# Patient Record
Sex: Male | Born: 1940 | Race: White | Hispanic: No | Smoking: Former smoker
Health system: Southern US, Community
[De-identification: ages and names within clinical notes are randomized; demographics above are authoritative.]

## PROBLEM LIST (undated history)

## (undated) DIAGNOSIS — Z87442 Personal history of urinary calculi: Secondary | ICD-10-CM

## (undated) DIAGNOSIS — Z8619 Personal history of other infectious and parasitic diseases: Secondary | ICD-10-CM

## (undated) DIAGNOSIS — Z860100 Personal history of colon polyps, unspecified: Secondary | ICD-10-CM

## (undated) DIAGNOSIS — M1990 Unspecified osteoarthritis, unspecified site: Secondary | ICD-10-CM

## (undated) DIAGNOSIS — G8929 Other chronic pain: Secondary | ICD-10-CM

## (undated) DIAGNOSIS — H409 Unspecified glaucoma: Secondary | ICD-10-CM

## (undated) DIAGNOSIS — Z9889 Other specified postprocedural states: Secondary | ICD-10-CM

## (undated) DIAGNOSIS — E119 Type 2 diabetes mellitus without complications: Secondary | ICD-10-CM

## (undated) DIAGNOSIS — R399 Unspecified symptoms and signs involving the genitourinary system: Secondary | ICD-10-CM

## (undated) DIAGNOSIS — Z8719 Personal history of other diseases of the digestive system: Secondary | ICD-10-CM

## (undated) DIAGNOSIS — Z9689 Presence of other specified functional implants: Secondary | ICD-10-CM

## (undated) DIAGNOSIS — G4733 Obstructive sleep apnea (adult) (pediatric): Secondary | ICD-10-CM

## (undated) DIAGNOSIS — M549 Dorsalgia, unspecified: Secondary | ICD-10-CM

## (undated) DIAGNOSIS — D414 Neoplasm of uncertain behavior of bladder: Secondary | ICD-10-CM

## (undated) DIAGNOSIS — N529 Male erectile dysfunction, unspecified: Secondary | ICD-10-CM

## (undated) DIAGNOSIS — N400 Enlarged prostate without lower urinary tract symptoms: Secondary | ICD-10-CM

## (undated) DIAGNOSIS — K5790 Diverticulosis of intestine, part unspecified, without perforation or abscess without bleeding: Secondary | ICD-10-CM

## (undated) DIAGNOSIS — Z87828 Personal history of other (healed) physical injury and trauma: Secondary | ICD-10-CM

## (undated) DIAGNOSIS — K219 Gastro-esophageal reflux disease without esophagitis: Secondary | ICD-10-CM

## (undated) DIAGNOSIS — N486 Induration penis plastica: Secondary | ICD-10-CM

## (undated) DIAGNOSIS — R202 Paresthesia of skin: Secondary | ICD-10-CM

## (undated) DIAGNOSIS — E785 Hyperlipidemia, unspecified: Secondary | ICD-10-CM

## (undated) DIAGNOSIS — Z9989 Dependence on other enabling machines and devices: Secondary | ICD-10-CM

## (undated) HISTORY — PX: ROTATOR CUFF REPAIR: SHX139

## (undated) HISTORY — PX: OTHER SURGICAL HISTORY: SHX169

## (undated) HISTORY — PX: NASAL SEPTUM SURGERY: SHX37

## (undated) HISTORY — PX: RETINAL DETACHMENT SURGERY: SHX105

## (undated) HISTORY — PX: INGUINAL HERNIA REPAIR: SUR1180

## (undated) HISTORY — PX: COLONOSCOPY: SHX174

## (undated) HISTORY — PX: SHOULDER OPEN ROTATOR CUFF REPAIR: SHX2407

## (undated) HISTORY — PX: APPENDECTOMY: SHX54

## (undated) HISTORY — PX: CATARACT EXTRACTION W/ INTRAOCULAR LENS  IMPLANT, BILATERAL: SHX1307

## (undated) HISTORY — DX: Hyperlipidemia, unspecified: E78.5

## (undated) HISTORY — PX: TONSILLECTOMY: SUR1361

---

## 1998-05-24 ENCOUNTER — Ambulatory Visit (HOSPITAL_COMMUNITY): Admission: RE | Admit: 1998-05-24 | Discharge: 1998-05-24 | Payer: Self-pay | Admitting: Specialist

## 1998-07-18 ENCOUNTER — Ambulatory Visit (HOSPITAL_COMMUNITY): Admission: RE | Admit: 1998-07-18 | Discharge: 1998-07-18 | Payer: Self-pay | Admitting: Specialist

## 1999-08-30 ENCOUNTER — Ambulatory Visit (HOSPITAL_COMMUNITY): Admission: RE | Admit: 1999-08-30 | Discharge: 1999-08-30 | Payer: Self-pay | Admitting: Specialist

## 2001-04-15 ENCOUNTER — Encounter: Payer: Self-pay | Admitting: Family Medicine

## 2001-04-15 ENCOUNTER — Encounter: Admission: RE | Admit: 2001-04-15 | Discharge: 2001-04-15 | Payer: Self-pay | Admitting: Family Medicine

## 2003-04-23 ENCOUNTER — Ambulatory Visit (HOSPITAL_COMMUNITY): Admission: RE | Admit: 2003-04-23 | Discharge: 2003-04-23 | Payer: Self-pay | Admitting: Gastroenterology

## 2004-06-16 ENCOUNTER — Inpatient Hospital Stay (HOSPITAL_COMMUNITY): Admission: RE | Admit: 2004-06-16 | Discharge: 2004-06-18 | Payer: Self-pay | Admitting: Orthopaedic Surgery

## 2004-06-16 HISTORY — PX: ANTERIOR CERVICAL DECOMP/DISCECTOMY FUSION: SHX1161

## 2005-01-22 ENCOUNTER — Ambulatory Visit (HOSPITAL_COMMUNITY): Admission: RE | Admit: 2005-01-22 | Discharge: 2005-01-22 | Payer: Self-pay | Admitting: Neurosurgery

## 2005-03-08 ENCOUNTER — Inpatient Hospital Stay (HOSPITAL_COMMUNITY): Admission: RE | Admit: 2005-03-08 | Discharge: 2005-03-10 | Payer: Self-pay | Admitting: Neurosurgery

## 2005-03-08 HISTORY — PX: POSTERIOR LAMINECTOMY / DECOMPRESSION CERVICAL SPINE: SUR739

## 2005-03-13 ENCOUNTER — Emergency Department (HOSPITAL_COMMUNITY): Admission: EM | Admit: 2005-03-13 | Discharge: 2005-03-13 | Payer: Self-pay | Admitting: Emergency Medicine

## 2005-07-12 ENCOUNTER — Encounter: Admission: RE | Admit: 2005-07-12 | Discharge: 2005-10-10 | Payer: Self-pay | Admitting: Neurosurgery

## 2005-12-20 ENCOUNTER — Ambulatory Visit: Payer: Self-pay | Admitting: Pulmonary Disease

## 2006-01-11 ENCOUNTER — Encounter: Payer: Self-pay | Admitting: Pulmonary Disease

## 2006-01-11 ENCOUNTER — Ambulatory Visit (HOSPITAL_BASED_OUTPATIENT_CLINIC_OR_DEPARTMENT_OTHER): Admission: RE | Admit: 2006-01-11 | Discharge: 2006-01-11 | Payer: Self-pay | Admitting: Pulmonary Disease

## 2006-02-05 ENCOUNTER — Ambulatory Visit: Payer: Self-pay | Admitting: Pulmonary Disease

## 2006-02-06 ENCOUNTER — Ambulatory Visit: Payer: Self-pay | Admitting: Pulmonary Disease

## 2006-02-12 ENCOUNTER — Ambulatory Visit (HOSPITAL_COMMUNITY): Admission: RE | Admit: 2006-02-12 | Discharge: 2006-02-12 | Payer: Self-pay | Admitting: Neurosurgery

## 2006-03-25 ENCOUNTER — Ambulatory Visit: Payer: Self-pay | Admitting: Pulmonary Disease

## 2007-11-26 ENCOUNTER — Ambulatory Visit (HOSPITAL_BASED_OUTPATIENT_CLINIC_OR_DEPARTMENT_OTHER): Admission: RE | Admit: 2007-11-26 | Discharge: 2007-11-27 | Payer: Self-pay | Admitting: Urology

## 2007-11-26 HISTORY — PX: PENILE PROSTHESIS IMPLANT: SHX240

## 2008-03-11 ENCOUNTER — Encounter: Admission: RE | Admit: 2008-03-11 | Discharge: 2008-03-11 | Payer: Self-pay | Admitting: Neurosurgery

## 2008-03-17 ENCOUNTER — Encounter: Admission: RE | Admit: 2008-03-17 | Discharge: 2008-03-17 | Payer: Self-pay | Admitting: Neurosurgery

## 2008-04-13 ENCOUNTER — Encounter: Admission: RE | Admit: 2008-04-13 | Discharge: 2008-04-13 | Payer: Self-pay | Admitting: Specialist

## 2008-06-09 DIAGNOSIS — J302 Other seasonal allergic rhinitis: Secondary | ICD-10-CM | POA: Insufficient documentation

## 2008-06-09 DIAGNOSIS — E785 Hyperlipidemia, unspecified: Secondary | ICD-10-CM | POA: Insufficient documentation

## 2008-06-09 DIAGNOSIS — J3089 Other allergic rhinitis: Secondary | ICD-10-CM

## 2008-06-10 ENCOUNTER — Ambulatory Visit: Payer: Self-pay | Admitting: Pulmonary Disease

## 2008-08-13 ENCOUNTER — Encounter: Payer: Self-pay | Admitting: Pulmonary Disease

## 2008-08-20 ENCOUNTER — Inpatient Hospital Stay (HOSPITAL_COMMUNITY): Admission: RE | Admit: 2008-08-20 | Discharge: 2008-08-22 | Payer: Self-pay | Admitting: Orthopedic Surgery

## 2008-08-20 HISTORY — PX: OTHER SURGICAL HISTORY: SHX169

## 2009-01-16 ENCOUNTER — Encounter: Admission: RE | Admit: 2009-01-16 | Discharge: 2009-01-16 | Payer: Self-pay | Admitting: Neurosurgery

## 2009-02-10 ENCOUNTER — Inpatient Hospital Stay (HOSPITAL_COMMUNITY): Admission: RE | Admit: 2009-02-10 | Discharge: 2009-02-14 | Payer: Self-pay | Admitting: Specialist

## 2009-02-10 HISTORY — PX: TOTAL KNEE ARTHROPLASTY: SHX125

## 2009-05-12 ENCOUNTER — Ambulatory Visit (HOSPITAL_COMMUNITY): Admission: RE | Admit: 2009-05-12 | Discharge: 2009-05-13 | Payer: Self-pay | Admitting: Neurosurgery

## 2009-05-12 HISTORY — PX: OTHER SURGICAL HISTORY: SHX169

## 2009-06-27 ENCOUNTER — Ambulatory Visit: Payer: Self-pay | Admitting: Pulmonary Disease

## 2009-06-27 DIAGNOSIS — G4733 Obstructive sleep apnea (adult) (pediatric): Secondary | ICD-10-CM | POA: Insufficient documentation

## 2009-07-22 ENCOUNTER — Inpatient Hospital Stay (HOSPITAL_COMMUNITY): Admission: RE | Admit: 2009-07-22 | Discharge: 2009-07-24 | Payer: Self-pay | Admitting: Orthopedic Surgery

## 2009-07-22 HISTORY — PX: OTHER SURGICAL HISTORY: SHX169

## 2010-07-17 ENCOUNTER — Ambulatory Visit: Payer: Self-pay | Admitting: Pulmonary Disease

## 2010-09-15 ENCOUNTER — Encounter: Payer: Self-pay | Admitting: Pulmonary Disease

## 2010-10-12 NOTE — Assessment & Plan Note (Signed)
Summary: rov for osa   Visit Type:  Follow-up  CC:  1 year follow up. pt states he wears cpap eveyrnight x 6-7 hrs a night. pt states he is not having any problems. .  History of Present Illness: the pt comes in today for f/u of his known osa.  He is wearing cpap compliantly, and denies any issues with cpap mask or pressure.  He feels that he is sleeping well with the device, and his wife has not seen breakthru snoring.  He does however, experience some afternoon sleepiness if he tries to read, and may take an afternoon nap.  Of note, his weight is actually down 2 pounds from last visit.  Preventive Screening-Counseling & Management  Alcohol-Tobacco     Smoking Status: quit  Current Medications (verified): 1)  Omeprazole 20 Mg Tbec (Omeprazole) .... Take 1 Tablet By Mouth Once A Day 2)  Aspirin Low Dose 81 Mg Tabs (Aspirin) .... Take 1 Tablet By Mouth Once A Day 3)  Requip 1 Mg Tabs (Ropinirole Hcl) .... Take 1 Tab By Mouth At Bedtime 4)  Gabapentin 300 Mg Caps (Gabapentin) .... Take 2 Tablets By Mouth Two Times A Day 5)  Fexofenadine Hcl 180 Mg Tabs (Fexofenadine Hcl) .... Take 1 Tablet By Mouth Once A Day 6)  Simvastatin 20 Mg Tabs (Simvastatin) .... Take 1/2 Tab By Mouth Once Daily 7)  Fish Oil 1200 Mg Caps (Omega-3 Fatty Acids) .... Take 1 Tablet By Mouth Once A Day 8)  Flunisolide .... Use Daily As Needed 9)  Percocet 10-325 Mg Tabs (Oxycodone-Acetaminophen) .... Take 1 Tablet By Mouth Once A Day 10)  Naproxen 500 Mg Tabs (Naproxen) .... One Tablet Two Times A Day  Allergies (verified): No Known Drug Allergies  Social History: Patient states former smoker. quit 10/1970. 1-1 1/2 ppd.   Review of Systems       The patient complains of nasal congestion/difficulty breathing through nose and hand/feet swelling.  The patient denies shortness of breath with activity, shortness of breath at rest, productive cough, non-productive cough, coughing up blood, chest pain, irregular  heartbeats, acid heartburn, indigestion, loss of appetite, weight change, abdominal pain, difficulty swallowing, sore throat, tooth/dental problems, headaches, sneezing, itching, ear ache, anxiety, depression, joint stiffness or pain, rash, change in color of mucus, and fever.    Vital Signs:  Patient profile:   70 year old male Height:      72 inches Weight:      267.50 pounds BMI:     36.41 O2 Sat:      94 % on Room air Temp:     98.3 degrees F oral Pulse rate:   73 / minute Cuff size:   regular  Vitals Entered By: Carver Fila (July 17, 2010 2:00 PM)  O2 Flow:  Room air CC: 1 year follow up. pt states he wears cpap eveyrnight x 6-7 hrs a night. pt states he is not having any problems.  Comments meds and allergies updated Phone number updated Mindy Edward Jolly  July 17, 2010 2:00 PM    Physical Exam  General:  obese male in nad Nose:  no skin breakdown or pressure necrosis from cpap mask Extremities:  no significant edema, no cyanosis  Neurologic:  alert, but does appear mildly sleepy. moves all 4.   Impression & Recommendations:  Problem # 1:  OBSTRUCTIVE SLEEP APNEA (ICD-327.23) the pt has known osa, and has been compliant with therapy.  He feels he is doing well,  but does recently note some afternoon sleepiness with periods of inactivity.  His weight is fairly neutral, and he denies any machine function issues or significant mask leaks.  I think it is worth rechecking his pressure on auto for the next 2 weeks to make sure everything is functioning properly and his pressure needs have not changed.  Medications Added to Medication List This Visit: 1)  Gabapentin 300 Mg Caps (Gabapentin) .... Take 2 tablets by mouth two times a day 2)  Flunisolide  .... Use daily as needed 3)  Naproxen 500 Mg Tabs (Naproxen) .... One tablet two times a day  Other Orders: Est. Patient Level III (16109) DME Referral (DME)  Patient Instructions: 1)  will recheck your pressure with auto  mode for 2 weeks, and will let you know your optimal pressure. 2)  work on weight loss 3)  followup with me in one year.   Immunization History:  Influenza Immunization History:    Influenza:  historical (05/11/2010)

## 2010-10-12 NOTE — Letter (Signed)
Summary: CMN for DME / [facility]  CMN for DME / [facility]   Imported By: Lennie Odor 09/26/2010 10:50:57  _____________________________________________________________________  External Attachment:    Type:   Image     Comment:   External Document

## 2010-12-13 LAB — URINALYSIS, ROUTINE W REFLEX MICROSCOPIC
Hgb urine dipstick: NEGATIVE
Ketones, ur: NEGATIVE mg/dL
Nitrite: NEGATIVE
Specific Gravity, Urine: 1.008 (ref 1.005–1.030)

## 2010-12-13 LAB — CBC
HCT: 30.6 % — ABNORMAL LOW (ref 39.0–52.0)
HCT: 31.6 % — ABNORMAL LOW (ref 39.0–52.0)
HCT: 40.8 % (ref 39.0–52.0)
MCHC: 33.5 g/dL (ref 30.0–36.0)
Platelets: 174 10*3/uL (ref 150–400)
Platelets: 182 10*3/uL (ref 150–400)
RDW: 15 % (ref 11.5–15.5)
RDW: 15.1 % (ref 11.5–15.5)
WBC: 6.6 10*3/uL (ref 4.0–10.5)
WBC: 9.9 10*3/uL (ref 4.0–10.5)

## 2010-12-13 LAB — BASIC METABOLIC PANEL
BUN: 8 mg/dL (ref 6–23)
BUN: 9 mg/dL (ref 6–23)
CO2: 25 mEq/L (ref 19–32)
Calcium: 8.1 mg/dL — ABNORMAL LOW (ref 8.4–10.5)
Calcium: 8.5 mg/dL (ref 8.4–10.5)
Chloride: 103 mEq/L (ref 96–112)
Chloride: 106 mEq/L (ref 96–112)
GFR calc Af Amer: >60 mL/min (ref >60)
GFR calc non Af Amer: >60 mL/min (ref >60)
GFR calc non Af Amer: >60 mL/min (ref >60)
Glucose, Bld: 109 mg/dL — ABNORMAL HIGH (ref 70–99)
Glucose, Bld: 156 mg/dL — ABNORMAL HIGH (ref 70–99)
Potassium: 4.1 mEq/L (ref 3.5–5.1)
Sodium: 137 mEq/L (ref 135–145)
Sodium: 138 mEq/L (ref 135–145)

## 2010-12-13 LAB — DIFFERENTIAL
Basophils Absolute: 0 10*3/uL (ref 0.0–0.1)
Eosinophils Relative: 2 % (ref 0–5)
Lymphocytes Relative: 23 % (ref 12–46)
Monocytes Absolute: 0.6 10*3/uL (ref 0.1–1.0)
Neutro Abs: 4.3 10*3/uL (ref 1.7–7.7)
Neutrophils Relative %: 66 % (ref 43–77)

## 2010-12-13 LAB — PROTIME-INR: Prothrombin Time: 12.8 seconds (ref 11.6–15.2)

## 2010-12-13 LAB — APTT: aPTT: 28 seconds (ref 24–37)

## 2010-12-13 LAB — TYPE AND SCREEN
ABO/RH(D): O POS
Antibody Screen: NEGATIVE

## 2010-12-13 LAB — POCT I-STAT 4, (NA,K, GLUC, HGB,HCT): Hemoglobin: 11.2 g/dL — ABNORMAL LOW (ref 13.0–17.0)

## 2010-12-16 LAB — CBC
HCT: 41 % (ref 39.0–52.0)
Hemoglobin: 13.8 g/dL (ref 13.0–17.0)
RBC: 4.88 MIL/uL (ref 4.22–5.81)
RDW: 15.6 % — ABNORMAL HIGH (ref 11.5–15.5)
WBC: 11.3 10*3/uL — ABNORMAL HIGH (ref 4.0–10.5)

## 2010-12-18 LAB — CBC
HCT: 32.3 % — ABNORMAL LOW (ref 39.0–52.0)
HCT: 37.8 % — ABNORMAL LOW (ref 39.0–52.0)
HCT: 38.3 % — ABNORMAL LOW (ref 39.0–52.0)
Hemoglobin: 10.7 g/dL — ABNORMAL LOW (ref 13.0–17.0)
Hemoglobin: 12.4 g/dL — ABNORMAL LOW (ref 13.0–17.0)
Hemoglobin: 12.7 g/dL — ABNORMAL LOW (ref 13.0–17.0)
MCHC: 33.1 g/dL (ref 30.0–36.0)
MCV: 87.3 fL (ref 78.0–100.0)
Platelets: 164 10*3/uL (ref 150–400)
RBC: 4.38 MIL/uL (ref 4.22–5.81)
RDW: 14.6 % (ref 11.5–15.5)
RDW: 14.6 % (ref 11.5–15.5)
RDW: 14.9 % (ref 11.5–15.5)
WBC: 11.1 10*3/uL — ABNORMAL HIGH (ref 4.0–10.5)

## 2010-12-18 LAB — BASIC METABOLIC PANEL
BUN: 8 mg/dL (ref 6–23)
Calcium: 8.2 mg/dL — ABNORMAL LOW (ref 8.4–10.5)
Chloride: 100 mEq/L (ref 96–112)
GFR calc Af Amer: >60 mL/min (ref >60)
GFR calc non Af Amer: >60 mL/min (ref >60)
GFR calc non Af Amer: >60 mL/min (ref >60)
Glucose, Bld: 139 mg/dL — ABNORMAL HIGH (ref 70–99)
Glucose, Bld: 140 mg/dL — ABNORMAL HIGH (ref 70–99)
Potassium: 4.1 mEq/L (ref 3.5–5.1)
Potassium: 4.1 mEq/L (ref 3.5–5.1)
Sodium: 131 mEq/L — ABNORMAL LOW (ref 135–145)
Sodium: 138 mEq/L (ref 135–145)

## 2010-12-18 LAB — CROSSMATCH
ABO/RH(D): O POS
Antibody Screen: NEGATIVE

## 2010-12-19 LAB — URINALYSIS, ROUTINE W REFLEX MICROSCOPIC
Glucose, UA: NEGATIVE mg/dL
Hgb urine dipstick: NEGATIVE
Specific Gravity, Urine: 1.01 (ref 1.005–1.030)
Urobilinogen, UA: 1 mg/dL (ref 0.0–1.0)
pH: 6 (ref 5.0–8.0)

## 2010-12-19 LAB — DIFFERENTIAL
Eosinophils Absolute: 0.1 10*3/uL (ref 0.0–0.7)
Eosinophils Relative: 1 % (ref 0–5)
Lymphs Abs: 1.6 10*3/uL (ref 0.7–4.0)
Monocytes Absolute: 0.6 10*3/uL (ref 0.1–1.0)
Monocytes Relative: 8 % (ref 3–12)

## 2010-12-19 LAB — CBC
MCHC: 33.2 g/dL (ref 30.0–36.0)
RBC: 4.59 MIL/uL (ref 4.22–5.81)
RDW: 14.9 % (ref 11.5–15.5)

## 2010-12-19 LAB — PROTIME-INR: INR: 1 (ref 0.00–1.49)

## 2010-12-19 LAB — COMPREHENSIVE METABOLIC PANEL
ALT: 17 U/L (ref 0–53)
AST: 19 U/L (ref 0–37)
Albumin: 3.6 g/dL (ref 3.5–5.2)
Calcium: 9 mg/dL (ref 8.4–10.5)
GFR calc Af Amer: >60 mL/min (ref >60)
Potassium: 3.8 mEq/L (ref 3.5–5.1)
Sodium: 143 mEq/L (ref 135–145)
Total Protein: 5.7 g/dL — ABNORMAL LOW (ref 6.0–8.3)

## 2011-01-23 ENCOUNTER — Other Ambulatory Visit: Payer: Self-pay | Admitting: Orthopedic Surgery

## 2011-01-23 DIAGNOSIS — M25512 Pain in left shoulder: Secondary | ICD-10-CM

## 2011-01-23 NOTE — Op Note (Signed)
NAME:  Brian Ellison, Brian Ellison                ACCOUNT NO.:  [id number]   MEDICAL RECORD NO.:  [id number]          PATIENT TYPE:  AMB   LOCATION:  [facility]                         FACILITY:  [facility]   PHYSICIAN:  Mark C. Vernie Ammons, M.D.  DATE OF BIRTH:  1940-09-16   DATE OF PROCEDURE:  11/26/2007  DATE OF DISCHARGE:                               OPERATIVE REPORT   PREOPERATIVE DIAGNOSES:  1. Organic erectile dysfunction.  2. Peyronie disease.   POSTOPERATIVE DIAGNOSES:  1. Organic erectile dysfunction.  2. Peyronie disease.   PROCEDURE:  Three-piece inflatable penile prosthesis placement.   SURGEON:  Mark C. Vernie Ammons, M.D.   Threasa HeadsBufford Buttner.   ANESTHESIA:  General.   DRAINS:  16-French Foley catheter.   SPECIMENS:  None.   BLOOD LOSS:  Minimal.   COMPLICATIONS:  None.   INDICATIONS:  The patient is a 70 year old white male with organic  erectile dysfunction and Peyronie disease.  His Peyronie disease is  relatively mild and his erectile dysfunction had been managed in the  past with oral agents but has progressed to the point where those are no  longer effective.  We discussed all the other treatment options and he  has elected to proceed with inflatable penile prosthesis and understands  the risks, complications and alternatives.   DESCRIPTION OF OPERATION:  After informed consent, the patient was  brought to the major OR, placed on the table, administered general  anesthesia, and his genitalia were then scrubbed for 10 minutes and then  prepped with Betadine.  An official time-out was then performed.   A 16-French Foley catheter was then placed in the bladder and the  bladder was drained.  A median raphe midline scrotal incision was then  made and carried down to expose the corpora on the right and left sides.  The ring retractor was then placed and positioned in order to afford  exposure of the corpora.  Vicryl 2-0 sutures were then placed in the  corpora as stay sutures and the  corpora was then the corpus cavernosum  on the left side was incised along its length with the scalpel and then  extended proximally with the curved Mayo scissors.  I then dilated  proximally and distally with the Hegar dilators, starting at 8 and  progressing to 14 without difficulty.  I then irrigated the corpora  proximally and distally with antibiotic solution and measured the  corpora.  The measurement was 12 cm proximally, 7 cm distally.  The  corpus cavernosum was then irrigated again with antibiotic solution and  attention directed to the contralateral side, where an identical  procedure was then performed.  Measurements after dilating to 14 with  the Hegar dilators were then taken and found to be identical (12 cm  proximal, 7 cm distal).  This was then irrigated and attention was then  directed to placement of the reservoir.   A tunnel was then created through the scrotal incision up beneath the  subcutaneous tissue and into the external inguinal ring region, where  the floor of the external ring was then pierced medially and  the  retropubic space was then bluntly dissected with my index finger after  completely emptying the bladder.  I then placed the 75-mL reservoir and  this location and inflated it with 75 mL of water with no back flow  being noted.  It was noted to be seated in good position.  This location  was then copiously irrigated with antibiotic solution.   Attention was then redirected to the corpora and a 16-cm prosthesis with  3-cm rear tip extenders was chosen.  The rear tip extenders were applied  and the prosthesis was then placed first through the corporotomy  proximally and then using the Furlow inserter and the affixed suture,  the tip of the prosthesis was then placed distally.  This was performed  on left and right sides in an identical fashion, again with good seating  being noted initially.  I then inflated the prosthesis to its full  capacity and noted  a good, straight erection with no curvature or  wasting.  Both cylinders were noted to be equal at the distal aspect of  the corpora on both right and left sides and seated without any kinking.  I then deflated the cylinders and replaced shodded hemostats on the  tubing from the reservoir and from the pump, excised the excess tubing  and connected these two in the standard fashion using the supplied  connector.  I then removed the shod, cycled the device, which cycled  properly, and then drained the fluid from the corporal cylinders.  I  then closed the corporotomies with running 2-0 Vicryl suture and  irrigated the wound again with antibiotic solution.  I then created a  pocket in the left hemiscrotum for the pump.  Of note, I did place the  reservoir on the left-hand side.  With the pump in good position in the  left hemiscrotum, the scrotum was then irrigated again copiously with  antibiotic solution and the deep scrotal tissue was then reapproximated  with running 3-0 chromic suture.  A more superficial layer of scrotal  tissue was then closed, again with three running 3-0 chromic, and then  the skin was closed as a third layer with running 3-0 chromic suture and  Dermabond applied to the incision.  The device was left partially  inflated, fluffed 4x4s and a scrotal support were applied, and the  patient was awakened and taken to the recovery room in stable,  satisfactory condition with his Foley catheter indwelling.  He tolerated  the procedure well and there were no intraoperative complications.  He  did receive preoperative gentamicin  80 mg and 3 g of Unasyn and will be  maintained on antibiotics for 24 hours intravenously in the hospital and  then discharged the following day.      Mark C. Vernie Ammons, M.D.  Electronically Signed     MCO/MEDQ  D:  11/26/2007  T:  11/26/2007  Job:  [id number]

## 2011-01-23 NOTE — H&P (Signed)
NAME:  Brian Ellison, Brian Ellison                ACCOUNT NO.:  [id number]   MEDICAL RECORD NO.:  [id number]          PATIENT TYPE:  INP   LOCATION:  NA                           FACILITY:  [facility]   PHYSICIAN:  [name], M.D.DATE OF BIRTH:  05-09-1941   DATE OF ADMISSION:  02/10/2009  DATE OF DISCHARGE:                              HISTORY & PHYSICAL   CHIEF COMPLAINT:  Bilateral knee pain.   HISTORY OF PRESENT ILLNESS:  The patient is a 70 year old gentleman here  today for evaluation of a preoperative history and physical for  bilateral knee pain.  The left knee is bothersome with pain with range  of motion.  He has failed conservative treatment.  X-rays reveal he has  tricompartmental arthritic changes.  The patient has elected to proceed  with a total knee arthroplasty on the left.  The patient's right knee is  uncomfortable in the anterior aspect, and he has a anterior  patellofemoral changes.   ALLERGIES:  NO KNOWN DRUG ALLERGIES.   CURRENT MEDICATIONS:  1. Potassium/magnesium one tablet a day.  2. Gabapentin 600 mg twice a day.  3. Fexofenadine 180 mg once a day.  4. Simvastatin 10 mg once a day.  5. Requip 1 mg a day.  6. Omeprazole 20 mg a day.  7. Aspirin 81 mg a day.  8. Fish oil 1200 mg two tablets a day.  9. __________ daily.  10.Percocet 5/325 three tablets a day.  11.Sulindac 150 mg twice a day.  12.Coenzyme Q10 at 50 mg two tablets a day.  13.Red yeast 600 mg two tablets twice a day.  14.Multivitamin once a day.   PAST MEDICAL HISTORY:  1. History of shingles on the right side of his scalp region 5 years      previous.  2. He does have cataracts.  3. He does have sleep apnea.  He occasionally will use a CPAP machine.  4. He does have some reflux disease.  5. History of diverticulosis.  6. History of kidney stones 15-20 years previous.  7. Arthritis bilateral knees.   PRIMARY CARE PHYSICIAN:  Dr. Electa Sniff.   He also sees Dr. Venetia Maxon, Dr. Vernie Ammons, Dr. Ranell Patrick  and Dr. Ethelene Hal.   REVIEW OF SYSTEMS:  NEUROLOGIC:  Negative for any recent neurologic  issues.  Just had shingles in the past.  PULMONARY:  He denies any  shortness of breath, chest pain or productive cough.  CARDIOVASCULAR:  He denies any chest pains, irregular heart rhythms, his heart rate  beating too fast or slow or skipping beats.  GI:  He does have  occasional issues with reflux, but it is well-controlled with his  medications.  He has had a bout of diverticulosis in the past, 3 years  previous.  GU:  He does have a history of kidney stones 20 years  previous.  No recent urinary tract infections or BPH issues.  Endocrine  is unremarkable.  Hematologic is unremarkable.   PAST SURGICAL HISTORY:  He has had back surgery, cataracts, right  shoulder replacement this last December, left knee arthroscopy, right  foot surgery.  He has had a double hernia and an appendectomy in the  past.  He denies any complications with anesthesia.   FAMILY MEDICAL HISTORY:  Father does have a history of diabetes and  heart attack; died at 25.  Mother died of unknown causes.   SOCIAL HISTORY:  The patient is married.  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  He has smoked in the past.  No drug or alcohol.  He has three grown children.  Lives in a one-story home with his family.   PHYSICAL EXAMINATION:  VITALS:  Height is 6 feet one inch.  Weight 245  pounds.  GENERAL:  The patient is a healthy-appearing, centrally obese gentleman,  conscious, alert, appears to be a good historian.  HEENT:  Head was normocephalic.  Pupils equal, round and reactive.  Hearing is grossly intact.  NECK:  Supple.  Good range of motion.  No lymphadenopathy.  HEART:  Regular rate and rhythm.  No murmurs, rubs or gallops.  ABDOMEN:  Soft, nontender.  Bowel sounds present.  EXTREMITIES:  Left shoulder - he had good range of motion without any  discomfort.  Right shoulder - he was only able to abduct at about 45-50   degrees due to recent total shoulder and previous cervical nerve injury.  Lower Extremities - he has got good range of motion of both hips.  Left  knee - he lacks about 5 degrees extension.  He can flex it back to 120  degrees.  He has no instability.  No effusion today.  Right knee - does  have crepitus with patellofemoral motion.  He is able to fully extend  and flex it back to 120 degrees.  Calves - soft, nontender.  No signs of  phlebitis.  NEUROLOGIC:  The patient is conscious, alert and appropriate.  Good  historian.  BREAST/RECTAL/GU:  Deferred at this time.   IMPRESSION:  1. End-stage osteoarthritis left knee.  2. Patellofemoral arthritis right knee.  3. History of sleep apnea with CPAP usage.  4. Reflux disease.  5. Diverticulitis.  6. History of kidney stones.  7. History of shingles.  8. History of limited range of motion, right shoulder, with a shoulder      arthroplasty and previous neurologic injury.   PLAN:  The patient will undergo all routine labs and tests prior to  having a left total knee arthroplasty by Dr. Thomasena Edis at [facility] on February 10, 2009.      Brian Ellison, P.A.    ______________________________  [name], M.D.    RWK/MEDQ  D:  02/02/2009  T:  02/02/2009  Job:  [id number]

## 2011-01-23 NOTE — Op Note (Signed)
NAME:  Brian Ellison, Brian Ellison                ACCOUNT NO.:  [id number]   MEDICAL RECORD NO.:  [id number]          PATIENT TYPE:  INP   LOCATION:  5017                         FACILITY:  [facility]   PHYSICIAN:  Almedia Balls. Ranell Patrick, M.D. 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   PROCEDURE PERFORMED:  Right shoulder hemiarthroplasty with significant  scar tissue release.   ATTENDING SURGEON:  Almedia Balls. Ranell Patrick, MD   ASSISTANT:  Donnie Coffin. Dixon, PA-C   ANESTHESIA:  General anesthesia plus interscalene block anesthesia was  used.   ESTIMATED BLOOD LOSS:  150 mL.   FLUID REPLACEMENT:  1750 mL.   URINE OUTPUT:  330 mL.   INSTRUMENT COUNT:  Correct.   COMPLICATIONS:  There were no complications.   Preoperative antibiotics were given.   INDICATIONS:  The patient is a 70 year old male with severe stiffness  and functional loss as well as pain related to shoulder arthritis.  The  patient has had prior rotator cuff repair remotely.  This had now  disabling pain and functional loss for some time and he desires  operative treatment to restore function and eliminate pain.  He presents  for hemiarthroplasty versus total shoulder arthroplasty.  Informed  consent was obtained.   DESCRIPTION OF PROCEDURE:  After an adequate level of anesthesia  achieved, the patient was position in modified beach-chair position.  All neurovascular structures padded appropriately.  Right shoulder was  sterilely prepped and draped in the usual manner.  We approached the  shoulder through a anterior deltopectoral approach.  Dissection carried  down through subcutaneous tissues.  We identified the cephalic vein,  took it laterally with the deltoid.  We retracted the pectoralis  medially and released the upper centimeter pec and then identified  conjoined tendon and then took that medially as well.  The biceps tendon  was identified and the subscapularis was taken off right at the  bicipital groove.  We performed a release of the subscapularis from the  underlying capsule and also off the humerus and placed two #2 FiberWire  sutures in a modified Mason-Allen suture technique into the free end of  the subscapularis.  We released that off the undersurface of coracoid  process.  We then did an extensive release from the subdeltoid interval  including release of the rotator cuff.  It appeared that the cuff was  intact but extensively scarred glenoid, scarred also to the coracoid  process.  The coracohumeral ligament was very tight which we released  off the coracoid as well.  At this point, the patient's motion improved  significantly.  We then went ahead and released the soft tissue off the  humerus several centimeters inferior to the head.  Attention towards  protecting the axillary nerve.  At this point, we went ahead and  performed  a humeral neck cut using the neck cut guide with the elbow at  the patient's side and the forearm externally rotated about 10 degrees.  Once we had this cut made at the flesh with the rotator cuff insertion,  we checked the cuff again, it was felt like it was atrophic but intact.  Again, we released off the glenoid to make sure this was loose.  The  glenoid actually appeared to be in reasonable shape with decent  cartilage, some erosions but overall pretty good cartilage layer.  Labrum was circumferentially intact.  We did release the biceps tendon.  At this point, we went ahead and protecting the rotator cuff,  sequentially reamed up to size 10 reamer.  We then broached up to a size  10 and with a broach in place went ahead and removed the excess  osteophytes inferiorly  and posteroinferiorly and then placed 48 x 18  eccentric head in place, rotating the eccentric portion  posterosuperiorly.  We reduced the shoulder, had a good soft tissue  balance and tension and felt like this would provide excellent  functional improvement considering the release of the rotator cuff and  subdeltoid interval and also with replacement of the humeral head.  It  was felt that the glenoid was not necessary.  We went ahead and removed  the trial components.  We then placed two #2 FiberWire sutures through  drill holes in the subscap insertion site.  We then went ahead and  impaction grafted the 10 stem in place with available bone graft from  the humeral head.  This gave [place] a nice tight fit and we trialed again  the 48 x 18 eccentric head.  We were happy again with that soft tissue  balance and fit.  We went ahead and placed the real 48 x 18 eccentric  head and impacted that in place, again rotated posterosuperiorly,  removed a little bit more of excess bone off the posteroinferior humerus  so that would not impinge and then thoroughly irrigated the shoulder  when enclosed the rotator cuff to the subscapularis with a corner  stitch, a couple of corner stitches actually with figure-of-eight #2  FiberWire, and then repaired the subscapularis with the sutures through  the drill holes, getting a nice low profile subscap repair that was very  secure.  The patient with his subscap repaired can only external rotate  to about 10 degrees before there was significant tension in the subscap.  It was completely released again from the underlying capsule and the  coracoid was somewhat tight.  The patient easily was able to place his  hand on the abdomen and forward elevation without difficulty.  We  thoroughly irrigated the wound, closed the deltopectoral interval with 0  Vicryl followed by 2-0 Vicryl subcutaneous closure, and 4-0 Monocryl for  skin.  Steri-Strips applied followed by  sterile dressing.  The patient  tolerated the surgery well.      Almedia Balls. Ranell Patrick, M.D.  Electronically Signed     SRN/MEDQ  D:  08/20/2008  T:  08/20/2008  Job:  [id number]

## 2011-01-23 NOTE — Op Note (Signed)
NAME:  GREGOREY, NABOR NO.:  [id number]   MEDICAL RECORD NO.:  [id number]          PATIENT TYPE:  INP   LOCATION:  0008                         FACILITY:  [facility]   PHYSICIAN:  [name], M.D.DATE OF BIRTH:  08-Oct-1940   DATE OF PROCEDURE:  02/10/2009  DATE OF DISCHARGE:                               OPERATIVE REPORT   PREOPERATIVE DIAGNOSIS:  Left knee end-stage osteoarthritis.   POSTOPERATIVE DIAGNOSIS:  Left knee end-stage osteoarthritis.   PROCEDURE:  Left total knee arthroplasty.   SURGEON:  [name], M.D.   ASSISTANT:  Jamelle Rushing, P.A.   ANESTHESIA:  Spinal with Duramorph with monitored anesthesia care.   ESTIMATED BLOOD LOSS:  Less than 100 mL.   DRAINS:  One Hemovac.   COMPLICATIONS:  None.   TOURNIQUET TIME:  One hour and 20 minutes at 300 mmHg.   COMPLICATIONS:  None.   DISPOSITION:  PACU stable.   OPERATIVE DETAILS:   The patient was counseled in the holding area.  The chart was signed  appropriately.  He was taken to the operating room, placed in supine  position.  Monitored anesthesia care was delivered and spinal anesthetic  was administered.   Foley catheter was placed utilizing sterile technique by the OR  circulating nurse.  All extremities well-padded and bumped.  The left  knee was examined.  He had 5 degrees flexion, contraction; he could flex  to 120 degrees.  Knee was elevated, prepped with DuraPrep and draped in  sterile fashion.  Exsanguinated with an Esmarch and tourniquet was  inflated to 300 mmHg.  A straight midline incision was made through skin  and subcutaneous tissue.  Medial soft tissue flaps were developed.  Medial parapatellar arthrotomy was performed and proximal medial soft  tissue release was done due to his varus malalignment.  The knee was  then flexed.  End-stage arthritis changes, bone against bone.  Cruciate  ligaments were resected.  Starter hole made in the distal femur.   Canal  was irrigated, the effluent was clear.  Intramedullary rod was gently  placed.  I chose a 10-mm cut off the distal femur and 5-degree valgus  cut.  The distal femur was found be a size #5.  Rotation marks were set  and cut to fit a size #5.  Medial and lateral menisci removed under  direct visualization.  Geniculate vessels were coagulated.  Posterior  neurovascular structures were __________ off and protected throughout  the entire case.  Utilizing extramedullary alignment system, we then set  the tibia for a neutral slope and 10 mm cut off the least deficient side  which was the lateral side.  This was done.  Posteromedial and  posterofemoral osteophytes were removed.  With flexion and extension,  blocks for a 10 insert were well-balanced with gaps.  Proximal tibia was  found be a size #4.  Rotation coverage was set and reamer, and punch was  performed.  Femoral box cut was now performed, but this time it was a  size 5 femur, size 4 tibia, 10 insert with a well-balanced  flexion/extension gap, stable to varus and valgus stress, external  alignment rotated and balanced.  Patella was going to be a size #38.  Appropriate amount of bone was resected and locking holes were made and  patella tracked anatomically.  All trials were removed.  The knee was  irrigated with pulsatile lavage utilizing modern  cement technique, all  components were cemented in place, size 4 tibia, size 5 femur, 38  patella.  After the cement had cured, the excess cement was  meticulously removed.  I put in a final 10-mm posterior stabilized  rotating platform tibial insert.  Knee was now found be well balanced in  flexion/extension and excellent alignment.  Patellar tracking was  anatomic.  A medium Hemovac drain was placed.  Sequential closure in  layers was done.  The knee was closed with 90 degrees of flexion with  Vicryl suture of arthrotomy.  Subcu Vicryl, skin was Monocryl suture.  Steri-Strips were  applied.  Drains hooked to suction.  Tourniquet  deflated.  Normal circulation foot and ankle at the end of the case.  A  compression wrap was applied, ice pack and Ace wrap.  He tolerated the  procedure with no complications.  He was taken from the operating room  to the PACU in stable condition.   We utilized [employer] system, Sigma, size 5 femur, size 4  tibia, 10-mm posterior stabilized rotating platform tibial insert and a  38-mm all polyethylene patella, all cemented.   Help with  surgical technique and decision making, Mr. Oneida Alar, PA-C,  assistant, was needed throughout the entire case.           ______________________________  [name], M.D.     RAC/MEDQ  D:  02/10/2009  T:  02/10/2009  Job:  [id number]

## 2011-01-23 NOTE — Op Note (Signed)
NAME:  Brian Ellison, Brian Ellison                ACCOUNT NO.:  [id number]   MEDICAL RECORD NO.:  [id number]          PATIENT TYPE:  OIB   LOCATION:  3523                         FACILITY:  [facility]   PHYSICIAN:  Danae Orleans. Venetia Maxon, M.D.  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   PROCEDURE:  Permanent spinal cord stimulator placement via thoracic  laminectomy with left buttock implantable pulse generator using  Medtronic Ultra rechargeable system and 565 paddle lead.   SURGEON.:  Danae Orleans. Venetia Maxon, M.D.   ANESTHESIA:  General endotracheal anesthesia.   ESTIMATED BLOOD LOSS:  Minimal.   COMPLICATIONS:  None.   DISPOSITION:  Recovery.   INDICATIONS:  Brian Ellison is a 70 year old man with chronic intractable  bilateral lower extremity pain and low back pain who did well following  a percutaneous spinal cord stimulator trial.  It was elected to place a  permanent spinal cord stimulator.   DESCRIPTION OF PROCEDURE:  [name] was brought to the operating room.  Following satisfactory and uncomplicated induction of general  endotracheal anesthesia and placement of intravenous lines, the patient  was placed in the prone position on the Wilson frame.  His right arm was  tucked by his side as he has difficulty with right shoulder.  All soft  tissue and bony prominences were padded appropriately.  His C-arm was  then used to mark the T10-11 interspace and subsequently, back and left  buttock were prepped and draped in the usual sterile fashion.  The area  of planned incision was infiltrated with local lidocaine.  Incision was  made in the midline overlying T10-11 interspace and carried to the  thoracolumbar fascia, which was incised sharply  on left side of the  midline.  The interspace was identified.  Self-retaining retractor was  placed.  Intraoperative x-ray confirmed correct orientation.  Hemi-semi  laminectomy of T10 was then performed with high-speed drill and  completed with Kerrison rongeurs.  Spinal cord dura was decompressed,  and using dummy trial with 565 paddle lead, this was inserted easily  without difficulty and was well positioned in the midline.  Subsequently, this was exchanged for the actual lead and intraoperative  radiograph demonstrated well-positioned spinal cord stimulator paddled  over the body of T8 and T9 in the midline.  Fascia was then closed and  anchoring stitches were placed with Silastic anchors.  The left buttock  subcutaneous pocket was then created and a tunnel was made from pocket  to back.  The 60-cm leads were then passed to the pocket, anchored in  the Ultra rechargeable battery, and connections were torqued down  appropriately.  Redundant lead was circularized behind the implantable  pulse generator and this was inserted in the subcutaneous pocket.  The  wounds were then irrigated, closed with 2-0 and 3-0 Vicryl sutures, and  sterile occlusive dressings were placed with  benzoin, Steri-Strips,  Telfa gauze, and tape.  Impedances were checked and found to be good.  There  were no problems with impedances of the battery, the lead connection.  The patient was then extubated in the operating room, taken to recovery  in stable and satisfactory condition, having tolerated the operation  well.  Counts were correct at the end of the case.      Danae Orleans. Venetia Maxon, M.D.  Electronically Signed     JDS/MEDQ  D:  05/12/2009  T:  05/13/2009  Job:  [id number]

## 2011-01-23 NOTE — Discharge Summary (Signed)
NAME:  Brian Ellison, Brian Ellison NO.:  [id number]   MEDICAL RECORD NO.:  [id number]          PATIENT TYPE:  INP   LOCATION:  1532                         [facility]AC[facility]   PHYSICIAN:  [name], M.D.DATE OF BIRTH:  1941/06/06   DATE OF ADMISSION:  02/10/2009  DATE OF DISCHARGE:  02/14/2009                               DISCHARGE SUMMARY   ADMISSION DIAGNOSES:  1. End-stage osteoarthritis left knee.  2. Patellofemoral arthritis right knee.  3. Sleep apnea with CPAP use.  4. Reflux.  5. Diverticulosis colitis.  6. History of kidney stones.  7. History of shingles.  8. History of shingles.  9. History of limited range of motion status post right shoulder      arthroplasty.   DISCHARGE DIAGNOSES:  1. Left total knee arthroplasty.  2. Postoperative nausea and vomiting resolved.  3. Postoperative over sedation from scheduled narcotics resolved with      changing  4. Narcotic dosing to p.r.n.  5. History of sleep apnea.  6. History of reflux disease.  7. History of diverticulitis.  8. History of kidney stones.  9. History of shingles.  10.History of right shoulder arthroplasty.   HISTORY OF PRESENT ILLNESS:  The patient is a 70 year old gentleman with  significant bilateral knee pain left being significantly worse than  right.  He has limited range of motion.  He has failed conservative  treatment.  X-rays reveal that he had tricompartmental arthritic  changes.  The patient elected to proceed with a left total knee  arthroplasty.   SURGICAL PROCEDURES:  On February 10, 2009 the patient was taken to the OR by  Dr. Valma Cava.  The patient had a left total knee arthroplasty with  a DePuy rotating platform system that was done under spinal and MAC  anesthesia.  There were no complications.  Minimal blood loss.  The  patient was transferred to the recovery room and then to the orthopedic  floor in good condition to follow total knee protocol.  The patient  had  the following components implanted:  A size 5 left femoral component, a  size 4 keel tibial tray, a size 5 10 mm thickness polyethylene bearing,  a size 38 three peg patella.   CONSULTANTS:  Following routine consults were requested:  Physical  therapy, case management, pharmacy.   HOSPITAL COURSE:  On February 10, 2009 the patient was admitted to [facility] under the care of Dr. Thomasena Edis. The patient was taken to  the OR where a left total knee arthroplasty was performed without any  complications.  The patient was transferred to the recovery room and  then the orthopedic floor in good condition on IV antibiotics, pain  medicines and Lovenox DVT prophylaxis to follow total knee protocol.  Patient has had a 4-day postoperative course in which initially he did  have some significant issues with nausea and vomiting but these  resolved.  He also had some issues related to some oversedation.  He was  on scheduled narcotics and his Neurontin dosing was increased from what  he took at home.  These two issues were corrected and the patient's  oversedation resolved.  The patient's vital signs remained stable  through his hospitalization.  He remained afebrile.  He did develop some  routine postoperative blood loss but his vital signs remained stable.  He tolerated it well so no transfusion was required.  The patient's  wound remained benign without any signs of infection.  His Hemovac was  removed without any difficulty and intact.  The patient continued to  work well with physical therapy once the above-mentioned issues were  resolved.   On postop day #4 the patient's vital signs were stable.  The patient was  sitting up, alert, awake and oriented, comfortable.  He had voided.  He  had been passing flatus.  His wound was well approximated with Steri-  Strips.  He had no signs of infection.  His calf was soft and nontender.  Arrangements were made for him to be discharged to home  for outpatient  home health physical therapy and follow-up per routine protocol.   LABS:  CBC on admission found WBCs to 7.5, hemoglobin 13.1, hematocrit  39.5, platelets 141.  Postop day #3 his CBC was WBCs 9.1, hematocrit of  10.7, hematocrit 32.3, platelets 164.  Routine chemistries on June 5th  found sodium of 131, potassium 4.1, BUN 8, a creatinine 0.76, glucose of  139.   DISCHARGE INSTRUCTIONS:  Diet - no restrictions.  Activity - The patient  is to increase his activity slowly with use of crutches or walker.  Wound care - The patient is to keep his wound clean and dry.  He is to  change his dressing on a daily basis.  Followup - He needs a follow-up  appointment Dr. Thomasena Edis 2 weeks from date of surgery.  The patient is to  call [phone] for that follow-up appointment.   MEDICATIONS:  1. Percocet 5 mg one or 2 tablets every 4-6 hours for pain if needed.  2. Robaxin 500 mg 1 tablet every 6 hours for muscle spasms if needed.  3. Lovenox 130 mg injection every 12 hours until gone.  4. Colace 100 mg 1 tablet twice a day.  He may get it over-the-counter      until off narcotics.  5. If having any problems with constipation MiraLax can be obtained      over-the-counter without prescription.  6. Iron 1 tablet twice a day until gone.  7. Sulindac is to be stopped until completed the Lovenox.  8. Fexofenadine 180 mg 1 tablet a day.  9. Potassium/magnesium 1 tablet a day.  10.Requip 1 mg at bedtime.  11.Econazole 20 mg 1 tablet in the morning.  12.Red yeast rice 600 mg daily.  13.Fish oil 1200 mg two times a day.  14.Simvastatin 10 mg once a day.  15.Aspirin 81 mg once a day.  16.Gabapentin 300 mg 2 tablets twice a day instead of three times a      day.  17.Fluconazole nasal spray as needed.   The patient's condition upon discharge to home is listed as improved and  good.      Jamelle Rushing, P.A.    ______________________________  [name], M.D.     RWK/MEDQ  D:  02/14/2009  T:  02/14/2009  Job:  [id number]   cc:   [name], M.D.  Fax: [phone]

## 2011-01-26 NOTE — H&P (Signed)
NAME:  Brian Ellison, Brian Ellison                ACCOUNT NO.:  [id number]   MEDICAL RECORD NO.:  [id number]          PATIENT TYPE:  INP   LOCATION:                               FACILITY:  [facility]   PHYSICIAN:  Maisie Fus B. Dixon, P.A.  DATE OF BIRTH:  [birth year]   DATE OF ADMISSION:  08/20/2008  DATE OF DISCHARGE:                              HISTORY & PHYSICAL   CHIEF COMPLAINTS:  Right shoulder pain.   HISTORY OF PRESENT ILLNESS:  The patient is a 70 year old male with  worsening right shoulder pains, it has been refractory to conservative  treatment.  The patient has elected have a right total shoulder  arthroplasty by Dr. Malon Kindle.   PAST MEDICAL HISTORY:  1. Sleep apnea.  2. GERD.  3. Osteoarthritis.   FAMILY MEDICAL HISTORY:  Coronary artery disease.   SOCIAL HISTORY:  The patient of Dr. Doristine Counter does not smoke, formal  smoker.  He is married.   DRUG ALLERGIES:  None.   CURRENT MEDICATIONS:  1. Oxycodone 10 mg one to two tablets q.4-6 h. p.r.n. pain.  2. Neurontin.  3. Zocor.  4. ReQuip.  5. Omeprazole.  6. Fexofenadine.   REVIEW OF SYSTEMS:  Pain with range of motion of the right upper  extremity.   PHYSICAL EXAMINATION:  VITAL SIGNS:  Pulse 80, respirations 16, and  blood pressure 120/76.  GENERAL:  The patient is a healthy-appearing 30-  year-old male, in no acute distress.  Pleasant medium affect and  oriented x3.  HEAD AND NECK:  Cranial nerves II-XII grossly intact.  His full range of  motion of cervical, thoracic, and lumbar spines without any difficulty.  CHEST:  Active breath sounds bilaterally.  No wheezes, rhonchi, or  rales.  HEART:  Regular rate and rhythm.  No murmur.  ABDOMEN:  Nontender and nondistended with active bowel sounds.  EXTREMITIES:  Moderate tenderness to the right upper extremity and  decreased range of motion with full flexion to about 90 degrees;  external rotation to 20 degrees, internal rotation 30.  Capillary refill  less than 2 seconds.  NEUROVASCULAR:  He is intact.  He has no pedal edema.  No rashes.   IMPRESSION:  Right shoulder end-stage osteoarthritis.  X-rays show a  right shoulder end-stage osteoarthritis.   PLAN OF ACTION:  Right total shoulder arthroplasty by Dr. Malon Kindle.      Wil B. Ferne Coe.     TBD/MEDQ  D:  07/28/2008  T:  07/28/2008  Job:  [id number]

## 2011-01-26 NOTE — Discharge Summary (Signed)
NAME:  Brian Ellison, Pennings                ACCOUNT NO.:  [id number]   MEDICAL RECORD NO.:  [id number]          PATIENT TYPE:  INP   LOCATION:  5017                         FACILITY:  [facility]   PHYSICIAN:  Almedia Balls. Ranell Patrick, M.D. DATE OF BIRTH:  Aug 04, 1941   DATE OF ADMISSION:  08/20/2008  DATE OF DISCHARGE:  08/22/2008                               DISCHARGE SUMMARY   ADMITTING DIAGNOSIS:  Right shoulder osteoarthritis and severe  adhesions.   DISCHARGE DIAGNOSIS:  Right shoulder osteoarthritis and severe  adhesions.   PROCEDURE PERFORMED:  Right shoulder hemiarthroplasty performed on  August 20, 2008 by Dr. Ranell Patrick.   CONSULTING SERVICE:  Physical Therapy and Occupational Therapy in  discharge planning.   HISTORY OF PRESENT ILLNESS:  The patient is a 70 year old male with a  history of painful right shoulder and loss of range of motion and  function..  The patient was noted to have osteoarthritis on radiographs  and the patient desires operative treatment to restore function and  eliminate pain.  For further details of the patient's past medical  history and physical examination, please see the medical record.   HOSPITAL COURSE:  The patient was admitted for orthopedic surgery where  the patient underwent right shoulder hemiarthroplasty with significant  scar tissue release.  The patient was transported to postoperatively to  the floor where he had an uncomplicated postoperative course remaining  afebrile, tolerating a regular diet.  He was discharged on oral Percocet  and Robaxin as well as preadmission medications and instructions to  perform daily exercises for the shoulder.  The patient is  nonweightbearing in that shoulder and will be following up this in 2  weeks as an outpatient.      Almedia Balls. Ranell Patrick, M.D.  Electronically Signed     SRN/MEDQ  D:  10/08/2008  T:  10/08/2008  Job:  [id number]

## 2011-01-26 NOTE — Op Note (Signed)
NAME:  PINKNEY, VENARD NO.:  [id number]   MEDICAL RECORD NO.:  [id number]          PATIENT TYPE:  INP   LOCATION:  2899                         FACILITY:  [facility]   PHYSICIAN:  Danae Orleans. Venetia Maxon, M.D.  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   PROCEDURE:  Posterior cervical laminectomy and foraminotomy C3-C6 levels  with lateral mass screws C3-C6 bilaterally with posterolateral arthrodesis  with morselizing bone autograft using the _Solanas_ posterior cervical  instrumentation system.   SURGEON:  Maeola Harman, M.D.   ASSISTANT:  Colon Branch, M.D.   ANESTHESIA:  General endotracheal anesthesia.   ESTIMATED BLOOD LOSS:  Minimal.   COMPLICATIONS:  None.   DISPOSITION:  Recovery.   INDICATIONS:  Kelsen Celona is a 70 year old man who has previously  undergone, by another surgeon, an anterior cervical corpectomy and fusion  for cervical spondylitic myelopathy.  He has undergone postoperative imaging  because of persistent and worsening right upper extremity pain which  demonstrates persistent foraminal stenosis as well as marked facet  arthropathy, particularly at the C5-6 level.  It was elected because of the  patient's large body habitus and prior anterior surgery to do a posterior  decompression and fusion at this point.   PROCEDURE:  Mr. Zavadil was brought to the operating room.  Following  satisfactory and uncomplicated induction of general endotracheal anesthesia  and placement of intravenous lines, the patient was placed in three pin head  fixation, was placed in his Aspen collar and was turned carefully into a  prone  position on chest rolls.  His arms were tucked to the side and padded  appropriately.  His posterior neck was then shaved, prepped and draped in  the usual sterile fashion after taking shoulders down to facilitate exposure  and x-ray visualization.  Subsequently, the area of planned incision was  infiltrated with 0.25% Marcaine and 0.5% lidocaine with 1:200,000  epinephrine.  An incision was made from the posterior neck from the C2-C6  levels and carried through copious adipose tissue to the posterior cervical  fascia which was incised bilaterally using electrocautery and subperiosteal  dissection.  The lateral masses of C3-C6 were then exposed and a self-  retaining retractor was placed with using cerebellar retractors.  An  intraoperative x-ray demonstrated marker probes at C2, C3 and C4.  Subsequently, the facet joints were decorticated at each of these levels.  There was marked facet arthropathy at the C4-5 and C5-6 level on the right  with distortion of the normal anatomy.  On the left, there appeared to be  more normal alignment of the facet joints without significant bony  excrescences around the highly arthritic joints.  Upon removing some of  these bony excrescences at C5-6, there appeared to be significant gap in the  facet joint at  this level, suggestive of marked facet arthropathy.  Because  the left side appeared to be less distorted, it was elected to place lateral  mass screws initially at this level and, using the __________ system, 14 x  3.5 mm lateral mass screws were placed according to the standard  trajectories at C3, C4, C5 and C6 levels and depth gauge was used to confirm  the appropriate depth and screw sizing.  All screws had excellent purchase.  Subsequently, similarly sized screws were placed on the right using similar  landmarks and two 5 cm sections of rod were cut and lordosed appropriately  and then affixed to the screw heads and the locking caps were placed  and the  screws were locked down in-situ.  Subsequently, under loupe magnification, a  laminectomy of the inferior aspect of C3 through superior of C6 was then  performed and the spinal cord dura was decompressed.  There was marked facet  arthropathy at the C5-6 and C4-5 levels and at both of these levels  foraminotomies were performed to decompress the lateral aspect of the spinal  cord and nerve roots.  Hemostasis was assured with Gelfoam soaked in  thrombin and nerve hook was easily inserted at the neural foramen at the C5-  6 level, after this decompression had been done this was felt to be  adequate.  Hemostasis was then assured.  The lateral masses and facet joints  of C3 through C6 were then decorticated with a high speed drill and the  morselized bone autograft, which was preserved from the posterior  decompression, was then placed in its location bilaterally.  Prior to doing  so, the wound was copiously irrigated with Bacitracin and saline.  Self-  retaining retractors were removed and hemostasis was assured and  subsequently the posterior cervical muscles were reapproximated with 0  Vicryl sutures and the posterior cervical fascia was reapproximated with 0  Vicryl sutures.  The subcutaneous tissues were reapproximated with 2-0  Vicryl and 3-0 Vicryl interrupted inverted sutures.  The wound was dressed  with benzoin, Steri-Strips, Telfa gauze and tape.  Patient was placed in a  cervical collar, returned to a supine position in the operating gurney and  extubated in the operating room, having tolerated the procedure well without  signs of any complications.  The counts were correct at the end of the case.       JDS/MEDQ  D:  03/08/2005  T:  03/08/2005  Job:  [id number]

## 2011-01-26 NOTE — Assessment & Plan Note (Signed)
[facility]                               PULMONARY OFFICE NOTE   NAME:Brian Ellison, Brian Ellison                       MRN:          [id number]  DATE:03/25/2006                            DOB:          [birth year]    SUBJECTIVE:  Brian Ellison comes in today for follow-up of his obstructive sleep  apnea.  He has been on CPAP, but is having a little bit of difficulty with  the mask fit and also getting used to the adjustments.  He is typically  wearing it most of the night, but awakens at 5 a.m. and takes it off and  goes back to sleep for another hour or two.  The patient does not feel that  the pressure is the issue, but rather the actual mask fit.   PHYSICAL EXAMINATION:  VITAL SIGNS:  Blood pressure is 128/80, pulse 69,  temperature 98.1, weight is 264 pounds, O2 saturation on room air is 95%.  SKIN:  There is no evidence of skin breakdown or pressure necrosis from the  CPAP mask.   IMPRESSION:  Very severe obstructive sleep apnea for which the patient is on  CPAP.  He is having no difficulty with pressure, but is having difficulty  with mask fit/adjustments.  At this point in time we do need to optimize the  pressure for him since he was started on a low pressure to try and improve  his compliance and tolerance.  We can do this through an auto titrate  device.  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  If he continues to struggle with this he may need to go back to  the laboratory for adjustments of all parameters.   PLAN:  1.  Will get his durable medical equipment company to work with him on mask      fit and also show him other mask options.  2.  Auto set device for the next two weeks to try and optimize pressure.  I      will call him with these results.  3.  Work aggressively on weight loss.  4.  If he is doing well after all the above  adjustments I will see him in      six months, but sooner if he continues to have difficulties.                                   Barbaraann Share, MD, FCCP   [facility]/Med[facility]  DD:  03/25/2006  DT:  03/26/2006  Job #:  [id number]   cc:   Teena Irani. Arlyce Dice, MD

## 2011-01-26 NOTE — H&P (Signed)
NAME:  Brian Ellison, Brian Ellison                ACCOUNT NO.:  [id number]   MEDICAL RECORD NO.:  [id number]          PATIENT TYPE:  OIB   LOCATION:  NA                           FACILITY:  [facility]   PHYSICIAN:  Sharolyn Douglas, M.D.        DATE OF BIRTH:  08/25/1941   DATE OF ADMISSION:  DATE OF DISCHARGE:                                HISTORY & PHYSICAL   DATE OF ADMISSION:  June 16, 2004   CHIEF COMPLAINT:  Pain in my neck and arms, more so in right than left.Marland Kitchen   PRESENT ILLNESS:  This 70 year old white male fell in August 2005 at work.  Unfortunately, he fell forward, landing on his face and head.  He had  immediate pain and weakness in the upper extremities.  For a short while he  had essentially no use of his arms.  He was seen by Dr. Lequita Halt of our  practice for this problem.  Unfortunately, this numbness became quite  persistent to the point where he had difficulty grasping things.  He also  had an unsteady gait as well.   He describes his pain primarily as a burning, radiating pain from the back  of the neck into the upper shoulders.  Any Valsalva maneuver such as  sneezing and coughing increases his discomfort.  The only time he is really  comfortable is he can rest his head by assuming a supine position.  Prior to  his fall, he has had no upper extremity or cervical complaints.  He has been  in relatively good health throughout his lifetime.  Examination reveals  guarding of the cervical spine.  Forward flexion markedly increases  discomfort with radiating into the chest area.  He has sensory deficit noted  at C5, 6, and 7, as well as 8 bilaterally.  X-rays show some degenerative  changes in the lumbar spine but fortunately, no subluxations or spondylosis  without fracture was seen.  MRI showed a C3-C4 large disc osteophyte which  protruded centrally.  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  Due to the fact that this patient really  cannot perform  any activity, even pleasurable, and in light of the findings,  it was felt that he would benefit with surgical intervention, being admitted  for an anterior cervical discectomy and fusion at C3-4 with allograft and  plate.  Possible corpectomy will be done at the time, depending on the  findings at surgery.  His family physician is Dr. Dara Hoyer and he is  aware of the patient's surgery, and as I understand it from [facility], he is to  get clearance from Dr. Arlyce Dice.   PAST MEDICAL HISTORY:  The patient has reflux disease as well as gastritis  and diverticulitis.  He has had a history of renal stones in the distant  past.   PAST SURGERIES:  1.  Bilateral rotator cuff repair.  2.  Left knee surgery.  3.  Double hernia repair.  4.  Cataracts, replaced with lenses.  5.  Deviated septum.  6.  Appendectomy.  7.  Surgery on the right foot.   CURRENT MEDICATIONS:  1.  Diclofenac one b.i.d.  2.  Simvastatin one-half tablet daily.  3.  Omeprazole one daily.  4.  Aspirin 81 mg one daily (will stop today).  5.  __________ one daily.  6.  Loratidine one daily.   No medical allergies.   FAMILY HISTORY:  Positive for diabetes.   SOCIAL HISTORY:  The patient is married.  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 His wife, Sallye Ober, will be the major caregiver after surgery.  He lives in a one-level home.   REVIEW OF SYSTEMS:  CNS:  No seizure disorder, paralysis, or double vision.  The patient does have radiculitis as mentioned above.  CARDIOVASCULAR:  No  chest pain, no angina or orthopnea.  RESPIRATORY:  No productive cough, no  hemoptysis, no shortness of breath.  GASTROINTESTINAL:  No nausea, vomiting,  melena, bloody stools.  GENITOURINARY:  No discharge, dysuria, hematuria.  MUSCULOSKELETAL:  Primarily in present illness.   PHYSICAL EXAMINATION:  GENERAL:  Alert and cooperative, friendly 70 year old   white male who is mildly obese.  VITAL SIGNS:  Blood pressure 120/76, pulse 68, respirations 12 - these are  taken with him supine.  HEENT:  Normocephalic, PERRLA, oropharynx is clear.  CHEST:  Clear to auscultation, no rhonchi, no rales.  HEART:  Regular rate and rhythm, no murmurs are heard.  ABDOMEN:  Soft, nontender, obese.  Liver and spleen not felt.  GENITALIA AND RECTAL:  Not done, not pertinent to present illness.  EXTREMITIES:  Upper extremities and cervical spine as in present illness  above.   ADMITTING DIAGNOSES:  1.  Cervical spondylitis with myelopathy.  2.  Reflux.  3.  Diverticulitis.   PLAN:  The patient will undergo anterior cervical discectomy and fusion at  C3-C4 with allograft and plate.       DLU/MEDQ  D:  06/14/2004  T:  06/14/2004  Job:  [id number]   cc:   Teena Irani. Arlyce Dice, M.D.  [id number]  [place]  [place] [address]  Fax: [phone]

## 2011-01-26 NOTE — Op Note (Signed)
NAME:  Brian Ellison, Brian Ellison                ACCOUNT NO.:  [id number]   MEDICAL RECORD NO.:  [id number]          PATIENT TYPE:  OIB   LOCATION:  2899                         FACILITY:  [facility]   PHYSICIAN:  Sharolyn Douglas, M.D.        DATE OF BIRTH:  August 04, 1941   DATE OF PROCEDURE:  06/16/2004  DATE OF DISCHARGE:                                 OPERATIVE REPORT   PREOPERATIVE DIAGNOSES:  1.  Cervical spondylotic myelopathy.  2.  Central cord syndrome.   POSTOPERATIVE DIAGNOSES:  1.  Cervical spondylotic myelopathy.  2.  Central cord syndrome.   OPERATION PERFORMED:  1.  C4 corpectomy with decompression of the spinal cord and nerve roots      bilaterally.  2.  Anterior cervical arthrodesis, C3 through C5 with placement of allograft      fibular strut packed with local autogenous bone graft.  3.  Operating microscope.  4.  Anterior cervical plating C3 through C5 using the Spinal Concepts      system.   SURGEON:  Sharolyn Douglas, M.D.   ASSISTANT:  Verlin Fester, P.A.   ANESTHESIA:  General endotracheal.   COMPLICATIONS:  None.   INDICATIONS FOR PROCEDURE:  The patient is a pleasant 70 year old male who  suffered a fall and hyperextension injury.  He had a central cord syndrome.  MRI and plain radiograph showed advanced degenerative changes with spinal  stenosis at C3-4 and to a lesser extent at the subadajcent levels.  Cord  signal identified behind the C4 vertebral body.  He elected to undergo  anterior decompression and fusion in hopes of improving his symptoms,  preventing further neurologic deterioration.  Risks, benefits and  alternatives were reviewed.   DESCRIPTION OF PROCEDURE:  The patient was properly identified in the  holding area and taken to the operating room.  He underwent general  endotracheal anesthesia without difficulty, given prophylactic intravenous  antibiotics.  He was carefully positioned with his head in a neutral  position at all times.  I carefully observed the  neck throughout the  intubation and positioning process.  He was kept in a neutral alignment.  Five pounds of halter traction was applied.  The neck was prepped and draped  in the usual sterile fashion.  A transverse incision was made in left side  at the level of the thyroid cartilage.  The patient had a very full thyroid  and the anatomic landmarks were difficult to palpate.  The platysma was  incised sharply.  The interval between sternocleidomastoid and strap muscles  medially was developed down to the prevertebral space.  The C3-4 level was  easily identifiable by the large anterior osteophytes.  The spinal needle  placed.  X-ray taken to  confirm location. Esophagus, trachea, carotid sheath identified and  protected at all times.  Longus colli muscle elevated out over the C3-4 and  C4-5 disk spaces bilaterally.  The patient had a very deep neck.  We  utilized the 60 mm blades from the Shadowline retractor set. The anterior  osteophyte at C3-4 was removed with a Leksell rongeur.  Caspar distraction  pins were placed in the C3 and C5 vertebral bodies.  Gentle distraction was  applied.  Diskectomy carried back to the posterior longitudinal ligament at  C3-4 and C4-5.  At the C3-4 level, the disk was degenerative and collapsed.  At C4-5 again, the disk space was somewhat narrowed although not as severe  as C3-4.  The uncovertebral joints were hypertrophied at both levels, C3-4  greater than C4-5.  Once we had fully defined the uncovertebral joints, a  corpectomy was completed by removing the interval C4 body using Leksell  rongeurs.  The bone graft was saved for later autografting.  A high speed  diamond bur was used then to thin the side walls and posterior cortex.  Microcurets and Kerrisons used to complete the corpectomy.  We found  ossification of the posterior longitudinal ligament which extended to about  the midportion of the C4 body.  At one point this was adherent to the   underlying dura.  It was felt that this was not going to be able to be  completely removed without entering dura.  We therefore elected to float the  bone posteriorly by releasing it circumferentially.  In the end we felt we  had good decompression of the spinal cord.  We then completed foraminotomies  at C3-4 and C4-5 bilaterally. The wound was irrigated.  Bleeding was  controlled with Flo-Seal and bipolar electrocautery.  We then measured the  defect and cut a fibula allograft to the appropriate length. This was  carefully tamped into position.  The distraction was released and we had an  excellent interference fit of the graft.  It should be noted that the graft  was packed with the local bone graft obtained from the corpectomy.  We then  placed a 42 mm Spinal Concepts plate with four 13 mm screws.  We had good  screw purchase.  We ensured the locking mechanism engaged.  Intraoperative X-  ray showed appropriate positioning of the plate as well as the interbody  graft.  The wound was irrigated.  The esophagus, trachea and carotid sheath  were examined.  A deep Penrose drain was left in place.  The platysma closed  with interrupted 2-0 Vicryl, subcutaneous layer closed with interrupted 3-0  Vicryl followed by a running 4-0 subcuticular Vicryl suture on the skin  edges.  Benzoin and Steri-Strips were placed.  A cervical collar was  applied.  The patient was extubated without difficulty and transferred to  the recovery room in stable condition.       MC/MEDQ  D:  06/16/2004  T:  06/17/2004  Job:  [id number]

## 2011-01-26 NOTE — Procedures (Signed)
NAME:  Brian Ellison, Brian Ellison NO.:  [id number]   MEDICAL RECORD NO.:  [id number]          PATIENT TYPE:  OUT   LOCATION:  SLEEP CENTER                 [facility]ACILITY[facility]  [facility]   PHYSICIAN:  Marcelyn Bruins, M.D. [facility] DATE OF BIRTH:  1941/08/20   DATE OF STUDY:  01/11/2006                              NOCTURNAL POLYSOMNOGRAM   REFERRING PHYSICIAN:  Dr. Marcelyn Bruins   INDICATION FOR STUDY:  Hypersomnia with sleep apnea.   EPWORTH SLEEPINESS SCORE:  12.   SLEEP ARCHITECTURE:  The patient had a total sleep time of 374 minutes with  decreased REM and never achieved slow-wave sleep.  Sleep onset latency was  normal and REM onset was quite prolonged.  Sleep efficiency was 86%.   RESPIRATORY DATA:  The patient was found to have 49 hypopneas and 600 apneas  for a respiratory disturbance index of 104 events per hour.  The events were  not positional.  There was severe snoring noted throughout.   OXYGEN DATA:  O2 desaturation as low as 68% with the patient's obstructive  events.   CARDIAC DATA:  No clinically significant cardiac arrhythmia.   MOVEMENT-PARASOMNIA:  None.   IMPRESSIONS-RECOMMENDATIONS:  Very severe obstructive sleep apnea/hypopnea  syndrome with a respiratory disturbance index of 104 events per hour and O2  desaturation as low as 68%.  Treatment for this degree of sleep apnea should  focus primarily on weight loss as well as CPAP.  The patient should also be  instructed not to drive if he is sleepy.           ______________________________  Marcelyn Bruins, M.D. [facility]  Diplomate, American Board of Sleep  Medicine     [facility]/MEDQ  D:  01/31/2006 14:15:21  T:  02/01/2006 07:51:18  Job:  [id number]

## 2011-02-12 ENCOUNTER — Ambulatory Visit
Admission: RE | Admit: 2011-02-12 | Discharge: 2011-02-12 | Disposition: A | Discharge status: Home / Self Care | Payer: Medicare Other | Source: Ambulatory Visit | Attending: Orthopedic Surgery | Admitting: Orthopedic Surgery

## 2011-02-12 DIAGNOSIS — M25512 Pain in left shoulder: Secondary | ICD-10-CM

## 2011-06-04 LAB — POCT HEMOGLOBIN-HEMACUE
Hemoglobin: 15
Operator id: 268271

## 2011-06-15 LAB — CBC
HCT: 36.6 % — ABNORMAL LOW (ref 39.0–52.0)
HCT: 45.7 % (ref 39.0–52.0)
Hemoglobin: 12.1 g/dL — ABNORMAL LOW (ref 13.0–17.0)
Hemoglobin: 15.1 g/dL (ref 13.0–17.0)
MCHC: 33 g/dL (ref 30.0–36.0)
MCHC: 33.2 g/dL (ref 30.0–36.0)
MCV: 87.1 fL (ref 78.0–100.0)
MCV: 87.1 fL (ref 78.0–100.0)
Platelets: 223 K/uL (ref 150–400)
RBC: 4.2 MIL/uL — ABNORMAL LOW (ref 4.22–5.81)
RBC: 5.25 MIL/uL (ref 4.22–5.81)
RDW: 14.8 % (ref 11.5–15.5)
RDW: 15 % (ref 11.5–15.5)
WBC: 9.8 K/uL (ref 4.0–10.5)

## 2011-06-15 LAB — BASIC METABOLIC PANEL WITH GFR
BUN: 15 mg/dL (ref 6–23)
CO2: 25 meq/L (ref 19–32)
Calcium: 9.6 mg/dL (ref 8.4–10.5)
Chloride: 108 meq/L (ref 96–112)
Creatinine, Ser: 0.81 mg/dL (ref 0.4–1.5)
GFR calc non Af Amer: >60 mL/min
Glucose, Bld: 90 mg/dL (ref 70–99)
Potassium: 4.2 meq/L (ref 3.5–5.1)
Sodium: 140 meq/L (ref 135–145)

## 2011-06-15 LAB — DIFFERENTIAL
Basophils Absolute: 0 K/uL (ref 0.0–0.1)
Basophils Relative: 1 % (ref 0–1)
Eosinophils Absolute: 0.1 K/uL (ref 0.0–0.7)
Eosinophils Relative: 1 % (ref 0–5)
Lymphocytes Relative: 17 % (ref 12–46)
Lymphs Abs: 1.7 K/uL (ref 0.7–4.0)
Monocytes Absolute: 0.7 K/uL (ref 0.1–1.0)
Monocytes Relative: 7 % (ref 3–12)
Neutro Abs: 7.4 K/uL (ref 1.7–7.7)
Neutrophils Relative %: 75 % (ref 43–77)

## 2011-06-15 LAB — URINALYSIS, ROUTINE W REFLEX MICROSCOPIC
Bilirubin Urine: NEGATIVE
Hgb urine dipstick: NEGATIVE
Ketones, ur: NEGATIVE mg/dL
Nitrite: NEGATIVE
Protein, ur: NEGATIVE mg/dL
Specific Gravity, Urine: 1.01 (ref 1.005–1.030)
Urobilinogen, UA: 1 mg/dL (ref 0.0–1.0)

## 2011-06-15 LAB — PROTIME-INR
INR: 0.9 (ref 0.00–1.49)
Prothrombin Time: 12.6 seconds (ref 11.6–15.2)

## 2011-06-15 LAB — BASIC METABOLIC PANEL
CO2: 28 mEq/L (ref 19–32)
Chloride: 103 mEq/L (ref 96–112)
GFR calc Af Amer: >60 mL/min (ref >60)
Glucose, Bld: 123 mg/dL — ABNORMAL HIGH (ref 70–99)
Potassium: 4 mEq/L (ref 3.5–5.1)
Sodium: 138 mEq/L (ref 135–145)

## 2011-06-15 LAB — TYPE AND SCREEN: Antibody Screen: NEGATIVE

## 2011-06-15 LAB — APTT: aPTT: 28 s (ref 24–37)

## 2011-07-13 ENCOUNTER — Encounter: Payer: Self-pay | Admitting: Pulmonary Disease

## 2011-07-16 ENCOUNTER — Ambulatory Visit: Payer: Self-pay | Admitting: Pulmonary Disease

## 2011-08-09 ENCOUNTER — Ambulatory Visit: Payer: Self-pay | Admitting: Pulmonary Disease

## 2011-08-27 ENCOUNTER — Ambulatory Visit: Payer: Self-pay | Admitting: Pulmonary Disease

## 2011-09-18 ENCOUNTER — Encounter: Payer: Self-pay | Admitting: Pulmonary Disease

## 2011-09-18 ENCOUNTER — Ambulatory Visit (INDEPENDENT_AMBULATORY_CARE_PROVIDER_SITE_OTHER): Payer: Medicare Other | Admitting: Pulmonary Disease

## 2011-09-18 ENCOUNTER — Telehealth: Payer: Self-pay | Admitting: Pulmonary Disease

## 2011-09-18 VITALS — BP 132/78 | HR 73 | Temp 99.1°F | Ht 72.0 in | Wt 285.0 lb

## 2011-09-18 DIAGNOSIS — G4733 Obstructive sleep apnea (adult) (pediatric): Secondary | ICD-10-CM

## 2011-09-18 NOTE — Progress Notes (Signed)
  Subjective:    Patient ID: Brian Ellison, male    DOB: 03-07-1941, 71 y.o.   MRN: [id number]  HPI The patient comes in today for followup of his obstructive sleep apnea.  He has been wearing CPAP compliantly, and reports no issues with mask fit or pressure.  His download today shows excellent compliance, and no significant mask leaks.  His AHI however, is elevated at 11.5.  The patient has gained 17 pounds since the last visit, and even more since his pressure was last adjusted.  He does note more restless sleep, but feels it is related to his chronic musculoskeletal complaints.  He does not have significant sleepiness issues during the day.   Review of Systems  Constitutional: Negative for fever and unexpected weight change.  HENT: Positive for congestion. Negative for ear pain, nosebleeds, sore throat, rhinorrhea, sneezing, trouble swallowing, dental problem, postnasal drip and sinus pressure.   Eyes: Negative for redness and itching.  Respiratory: Negative for cough, chest tightness, shortness of breath and wheezing.   Cardiovascular: Negative for palpitations and leg swelling.  Gastrointestinal: Negative for nausea and vomiting.  Genitourinary: Negative for dysuria.  Musculoskeletal: Negative for joint swelling.  Skin: Negative for rash.  Neurological: Negative for headaches.  Hematological: Does not bruise/bleed easily.  Psychiatric/Behavioral: Negative for dysphoric mood. The patient is not nervous/anxious.        Objective:   Physical Exam Overweight male in no acute distress Nose without purulence or discharge No skin breakdown or pressure necrosis from the CPAP mask Lower extremities with no edema, no cyanosis noted Alert, does not appear to be sleepy, moves all 4 extremities.       Assessment & Plan:

## 2011-09-18 NOTE — Assessment & Plan Note (Signed)
The patient has been wearing CPAP compliantly, and has no issues with the functioning of his device.  However, he has gained significant weight, and his download does show breakthrough events at 11.5 events per hour.  I do think it is worthwhile to have his pressure optimized again, and we'll do this on the automatic setting at home.  I've encouraged him to work aggressively on weight loss, and also to keep up with his mask changes and supplies.

## 2011-09-18 NOTE — Telephone Encounter (Signed)
I spoke with Merla from [facility] and was advised the last download for pt was in 2007. She states they have called pt and left a message to go out to pt's home to set pt up on auto cpap. Will forward to Dr. Shelle Iron as an Lorain Childes

## 2011-09-18 NOTE — Patient Instructions (Signed)
Will have your pressure re-optimized for the next few weeks, and let you know your setting. Work on weight loss followup with me in one year.

## 2011-09-27 NOTE — Telephone Encounter (Signed)
Will sign this message as KC has already sent order to [facility] as of 09/18/11 for auto download.

## 2011-10-29 ENCOUNTER — Other Ambulatory Visit: Payer: Self-pay | Admitting: Pulmonary Disease

## 2011-10-29 DIAGNOSIS — G4733 Obstructive sleep apnea (adult) (pediatric): Secondary | ICD-10-CM

## 2012-09-17 ENCOUNTER — Encounter: Payer: Self-pay | Admitting: Pulmonary Disease

## 2012-09-17 ENCOUNTER — Ambulatory Visit (INDEPENDENT_AMBULATORY_CARE_PROVIDER_SITE_OTHER): Payer: Medicare Other | Admitting: Pulmonary Disease

## 2012-09-17 VITALS — BP 114/82 | HR 74 | Temp 97.5°F | Ht 72.0 in | Wt 274.4 lb

## 2012-09-17 DIAGNOSIS — G4733 Obstructive sleep apnea (adult) (pediatric): Secondary | ICD-10-CM

## 2012-09-17 NOTE — Patient Instructions (Addendum)
Continue with cpap, but call if having issues. Continue working on weight loss followup with me in one year.

## 2012-09-17 NOTE — Assessment & Plan Note (Signed)
The patient is doing well with CPAP, and has actually lost 11 pounds since his last visit.  He is having no issues with his mask or pressure tolerance.  I've asked him to keep up with his mask changes and supplies, and to followup with me in one year.  I have also encouraged him to continue working on weight loss.

## 2012-09-17 NOTE — Progress Notes (Signed)
  Subjective:    Patient ID: Brian Ellison, male    DOB: 1941/09/07, 72 y.o.   MRN: [id number]  HPI The patient comes in today for followup of his obstructive sleep apnea.  He is wearing CPAP compliantly, is having no issues with his mask or pressure.  His pressure was optimized at the last visit to 11 cm.  He feels that he is sleeping well from a sleep apnea standpoint, but continues to have issues with chronic shoulder pain that can disrupt his sleep.  Of note, he has lost 11 pounds since his last visit.   Review of Systems  Constitutional: Negative for fever and unexpected weight change.  HENT: Positive for congestion, rhinorrhea ( mostly when removes CPAP mask in mornings) and postnasal drip. Negative for ear pain, nosebleeds, sore throat, sneezing, trouble swallowing, dental problem and sinus pressure.   Eyes: Negative for redness and itching.  Respiratory: Negative for cough, chest tightness, shortness of breath and wheezing.   Cardiovascular: Negative for palpitations and leg swelling.  Gastrointestinal: Negative for nausea and vomiting.  Genitourinary: Negative for dysuria.  Musculoskeletal: Negative for joint swelling.  Skin: Negative for rash.  Neurological: Negative for headaches.  Hematological: Does not bruise/bleed easily.  Psychiatric/Behavioral: Negative for dysphoric mood. The patient is not nervous/anxious.        Objective:   Physical Exam Obese male in no acute distress Nose without purulence or discharge noted No skin breakdown or pressure necrosis from the CPAP mask Lower extremities without edema, no cyanosis Alert and oriented, moves all 4 extremities.       Assessment & Plan:

## 2013-03-31 ENCOUNTER — Other Ambulatory Visit: Payer: Self-pay | Admitting: Physician Assistant

## 2013-03-31 MED ORDER — DEXTROSE 5 % IV SOLN: 2.0000 g | INTRAVENOUS | Status: DC

## 2013-03-31 MED ORDER — CHLORHEXIDINE GLUCONATE 4 % EX LIQD: 60.0000 mL | Freq: Once | CUTANEOUS | Status: DC

## 2013-03-31 NOTE — H&P (Signed)
Brian Ellison is an 72 y.o. male.    Chief Complaint: right knee pain   HPI: Pt is a 72 y.o. male complaining of right knee pain for multiple years. Pain had continually increased since the beginning. X-rays in the clinic show end-stage arthritic changes of the right knee. Pt has tried various conservative treatments which have failed to alleviate their symptoms, including shots and therapy. Various options are discussed with the patient. Risks, benefits and expectations were discussed with the patient. Patient understand the risks, benefits and expectations and wishes to proceed with surgery.   PCP:  Delorse Lek, MD  D/C Plans:  Home with HHPT  PMH: Past Medical History  Diagnosis Date  . Sleep apnea   . Dyslipidemia   . Allergic rhinitis     PSH: No past surgical history on file.  Social History:  reports that he quit smoking about 42 years ago. His smoking use included Cigarettes. He has a 15 pack-year smoking history. He does not have any smokeless tobacco history on file. His alcohol and drug histories are not on file.  Allergies:  No Known Allergies  Medications: Current Outpatient Prescriptions  Medication Sig Dispense Refill  . aspirin 81 MG tablet Take 81 mg by mouth daily.        . Cholecalciferol (VITAMIN D3) 2000 UNITS TABS Take 1 tablet by mouth daily.        . fexofenadine (ALLEGRA) 180 MG tablet Take 180 mg by mouth daily.        . flunisolide (NASAREL) 29 MCG/ACT (0.025%) nasal spray Place 2 sprays into the nose as needed. Dose is for each nostril.       Marland Kitchen gabapentin (NEURONTIN) 300 MG capsule Take 600 mg by mouth 2 (two) times daily.        . metFORMIN (GLUCOPHAGE) 500 MG tablet Take 500 mg by mouth daily with breakfast.      . Multiple Vitamin (MULTIVITAMIN) capsule Take 1 capsule by mouth daily.        . naproxen (NAPROSYN) 500 MG tablet Take 500 mg by mouth daily.       . Omega-3 Fatty Acids (FISH OIL) 1200 MG CAPS Take 1 capsule by mouth daily.          Marland Kitchen omeprazole (PRILOSEC) 20 MG capsule Take 20 mg by mouth daily.        Marland Kitchen rOPINIRole (REQUIP) 1 MG tablet Take 1 mg by mouth at bedtime.        . simvastatin (ZOCOR) 20 MG tablet Take 10 mg by mouth at bedtime.         No current facility-administered medications for this visit.    No results found for this or any previous visit (from the past 48 hour(s)). No results found.  ROS: Pain with rom of the right lower extremity  Physical Exam: BP:   118/78  ;  HR:   78  ; Resp:   12  : Alert and oriented 72 y.o. male in no acute distress Cranial nerves 2-12 intact Cervical spine: full rom with no tenderness, nv intact distally Chest: active breath sounds bilaterally, no wheeze rhonchi or rales Heart: regular rate and rhythm, no murmur Abd: non tender non distended with active bowel sounds Hip is stable with rom  Right knee no effusion nv intact distally Moderate medial joint line tenderness Antalgic gait  Assessment/Plan Assessment: right knee end stage osteoarthritis  Plan: Patient will undergo a right knee total arthroplasty by Dr. Ranell Patrick  at [facility]. Risks benefits and expectations were discussed with the patient. Patient understand risks, benefits and expectations and wishes to proceed.

## 2013-04-02 ENCOUNTER — Encounter (HOSPITAL_COMMUNITY): Payer: Self-pay | Admitting: Pharmacy Technician

## 2013-04-09 ENCOUNTER — Encounter (HOSPITAL_COMMUNITY): Payer: Self-pay

## 2013-04-09 ENCOUNTER — Encounter (HOSPITAL_COMMUNITY)
Admission: RE | Admit: 2013-04-09 | Discharge: 2013-04-09 | Disposition: A | Discharge status: Home / Self Care | Payer: Medicare Other | Source: Ambulatory Visit | Attending: Orthopedic Surgery | Admitting: Orthopedic Surgery

## 2013-04-09 DIAGNOSIS — Z01812 Encounter for preprocedural laboratory examination: Secondary | ICD-10-CM | POA: Insufficient documentation

## 2013-04-09 DIAGNOSIS — Z01818 Encounter for other preprocedural examination: Secondary | ICD-10-CM | POA: Insufficient documentation

## 2013-04-09 HISTORY — DX: Gastro-esophageal reflux disease without esophagitis: K21.9

## 2013-04-09 HISTORY — DX: Paresthesia of skin: R20.2

## 2013-04-09 HISTORY — DX: Unspecified osteoarthritis, unspecified site: M19.90

## 2013-04-09 HISTORY — DX: Personal history of urinary calculi: Z87.442

## 2013-04-09 HISTORY — DX: Unspecified glaucoma: H40.9

## 2013-04-09 HISTORY — DX: Personal history of colonic polyps: Z86.010

## 2013-04-09 HISTORY — DX: Personal history of colon polyps, unspecified: Z86.0100

## 2013-04-09 HISTORY — DX: Diverticulosis of intestine, part unspecified, without perforation or abscess without bleeding: K57.90

## 2013-04-09 HISTORY — DX: Personal history of other infectious and parasitic diseases: Z86.19

## 2013-04-09 LAB — CBC
Hemoglobin: 14.6 g/dL (ref 13.0–17.0)
MCH: 29 pg (ref 26.0–34.0)
MCV: 86.7 fL (ref 78.0–100.0)
Platelets: 209 10*3/uL (ref 150–400)
RBC: 5.03 MIL/uL (ref 4.22–5.81)

## 2013-04-09 LAB — BASIC METABOLIC PANEL
CO2: 28 mEq/L (ref 19–32)
Calcium: 9.5 mg/dL (ref 8.4–10.5)
Glucose, Bld: 108 mg/dL — ABNORMAL HIGH (ref 70–99)
Sodium: 141 mEq/L (ref 135–145)

## 2013-04-09 LAB — TYPE AND SCREEN
ABO/RH(D): O POS
Antibody Screen: NEGATIVE

## 2013-04-09 NOTE — Progress Notes (Signed)
Orders requested from [facility] surgery scheduler for Dr.Norris-message left on her voicemail

## 2013-04-09 NOTE — Progress Notes (Signed)
Pt doesn't have a cardiologist  Stress test done about 72yrs ago and not even sure as to why it was done  Denies ever having an echo or heart cath  Dr.Brent Rosezetta Schlatter is MEdical MD and also goes to [place] in [place] and [place]  Denies ever having a cxr or ekg in past yr

## 2013-04-09 NOTE — Pre-Procedure Instructions (Signed)
Brian Ellison  04/09/2013   Your procedure is scheduled on:  Fri, Aug 8 @ 8:00 AM  Report to [facility] at 6:00 AM.  Call this number if you have problems the morning of surgery: [phone]   Remember:   Do not eat food or drink liquids after midnight.   Take these medicines the morning of surgery with A SIP OF WATER: Flunisolide(Nasarel-if needed),Gabapentin(Neurontin),Omeprazole(Prilosec),and Tramadol(Ultram-if needed)               Stop taking your Aspirin and Fish Oil.No Goody's,BC's,Aleve,Ibuprofen,or any Herbal Medications   Do not wear jewelry  Do not wear lotions, powders, or colognes. You may wear deodorant.  Men may shave face and neck.  Do not bring valuables to the hospital.  [facility] is not responsible                   for any belongings or valuables.  Contacts, dentures or bridgework may not be worn into surgery.  Leave suitcase in the car. After surgery it may be brought to your room.  For patients admitted to the hospital, checkout time is 11:00 AM the day of  discharge.   Special Instructions: Shower using CHG 2 nights before surgery and the night before surgery.  If you shower the day of surgery use CHG.  Use special wash - you have one bottle of CHG for all showers.  You should use approximately 1/3 of the bottle for each shower.   Please read over the following fact sheets that you were given: Pain Booklet, Coughing and Deep Breathing, Blood Transfusion Information, Total Joint Packet, MRSA Information and Surgical Site Infection Prevention

## 2013-04-09 NOTE — Progress Notes (Signed)
Sleep study in epic from 2007

## 2013-04-15 ENCOUNTER — Other Ambulatory Visit: Payer: Self-pay

## 2013-04-16 NOTE — Progress Notes (Signed)
Patient notified of time change to arrive at [facility] tomorrow morning.

## 2013-04-17 ENCOUNTER — Encounter (HOSPITAL_COMMUNITY)
Admission: RE | Disposition: A | Discharge status: Skilled Nursing Facility | Payer: Self-pay | Source: Ambulatory Visit | Attending: Orthopedic Surgery

## 2013-04-17 ENCOUNTER — Inpatient Hospital Stay (HOSPITAL_COMMUNITY)
Admission: RE | Admit: 2013-04-17 | Discharge: 2013-04-21 | DRG: 470 | Disposition: A | Discharge status: Skilled Nursing Facility | Payer: Medicare Other | Source: Ambulatory Visit | Attending: Orthopedic Surgery | Admitting: Orthopedic Surgery

## 2013-04-17 ENCOUNTER — Encounter (HOSPITAL_COMMUNITY): Payer: Self-pay | Admitting: Vascular Surgery

## 2013-04-17 ENCOUNTER — Inpatient Hospital Stay (HOSPITAL_COMMUNITY): Payer: Medicare Other | Admitting: Anesthesiology

## 2013-04-17 ENCOUNTER — Encounter (HOSPITAL_COMMUNITY): Payer: Self-pay | Admitting: *Deleted

## 2013-04-17 ENCOUNTER — Inpatient Hospital Stay (HOSPITAL_COMMUNITY): Payer: Medicare Other

## 2013-04-17 DIAGNOSIS — Z87891 Personal history of nicotine dependence: Secondary | ICD-10-CM

## 2013-04-17 DIAGNOSIS — G4730 Sleep apnea, unspecified: Secondary | ICD-10-CM | POA: Diagnosis present

## 2013-04-17 DIAGNOSIS — M1711 Unilateral primary osteoarthritis, right knee: Secondary | ICD-10-CM

## 2013-04-17 DIAGNOSIS — Z79899 Other long term (current) drug therapy: Secondary | ICD-10-CM

## 2013-04-17 DIAGNOSIS — E785 Hyperlipidemia, unspecified: Secondary | ICD-10-CM | POA: Diagnosis present

## 2013-04-17 DIAGNOSIS — Z7982 Long term (current) use of aspirin: Secondary | ICD-10-CM

## 2013-04-17 DIAGNOSIS — D62 Acute posthemorrhagic anemia: Secondary | ICD-10-CM | POA: Diagnosis not present

## 2013-04-17 DIAGNOSIS — J309 Allergic rhinitis, unspecified: Secondary | ICD-10-CM | POA: Diagnosis present

## 2013-04-17 DIAGNOSIS — M1710 Unilateral primary osteoarthritis, unspecified knee: Principal | ICD-10-CM | POA: Diagnosis present

## 2013-04-17 HISTORY — PX: TOTAL KNEE ARTHROPLASTY: SHX125

## 2013-04-17 LAB — CBC
MCH: 28.9 pg (ref 26.0–34.0)
MCHC: 32.8 g/dL (ref 30.0–36.0)
MCV: 88 fL (ref 78.0–100.0)
Platelets: 170 10*3/uL (ref 150–400)
RDW: 13.9 % (ref 11.5–15.5)

## 2013-04-17 LAB — CREATININE, SERUM: Creatinine, Ser: 0.76 mg/dL (ref 0.50–1.35)

## 2013-04-17 SURGERY — ARTHROPLASTY, KNEE, TOTAL
Anesthesia: General | Site: Knee | Laterality: Right | Wound class: Clean

## 2013-04-17 MED ORDER — MULTIVITAMINS PO CAPS: 1.0000 | ORAL_CAPSULE | Freq: Every day | ORAL | Status: DC

## 2013-04-17 MED ORDER — MORPHINE SULFATE 4 MG/ML IJ SOLN
INTRAMUSCULAR | Status: AC
  Administered 2013-04-17: 4 mg via INTRAVENOUS
  Filled 2013-04-17: qty 1

## 2013-04-17 MED ORDER — EPHEDRINE SULFATE 50 MG/ML IJ SOLN
INTRAMUSCULAR | Status: DC | PRN
  Administered 2013-04-17 (×2): 10 mg via INTRAVENOUS

## 2013-04-17 MED ORDER — FENTANYL CITRATE 0.05 MG/ML IJ SOLN
INTRAMUSCULAR | Status: DC | PRN
  Administered 2013-04-17 (×3): 50 ug via INTRAVENOUS
  Administered 2013-04-17: 100 ug via INTRAVENOUS

## 2013-04-17 MED ORDER — PANTOPRAZOLE SODIUM 40 MG PO TBEC
40.0000 mg | DELAYED_RELEASE_TABLET | Freq: Every day | ORAL | Status: DC
  Administered 2013-04-18 – 2013-04-21 (×4): 40 mg via ORAL
  Filled 2013-04-17 (×4): qty 1

## 2013-04-17 MED ORDER — PATIENT'S GUIDE TO USING COUMADIN BOOK
Freq: Once | Status: DC
  Filled 2013-04-17: qty 1

## 2013-04-17 MED ORDER — ROPINIROLE HCL 1 MG PO TABS
1.0000 mg | ORAL_TABLET | Freq: Every day | ORAL | Status: DC
  Administered 2013-04-17 – 2013-04-20 (×4): 1 mg via ORAL
  Filled 2013-04-17 (×5): qty 1

## 2013-04-17 MED ORDER — LACTATED RINGERS IV SOLN
INTRAVENOUS | Status: DC | PRN
  Administered 2013-04-17 (×2): via INTRAVENOUS

## 2013-04-17 MED ORDER — LIDOCAINE HCL (CARDIAC) 20 MG/ML IV SOLN
INTRAVENOUS | Status: DC | PRN
  Administered 2013-04-17: 90 mg via INTRAVENOUS

## 2013-04-17 MED ORDER — OMEGA-3-ACID ETHYL ESTERS 1 G PO CAPS
1.0000 g | ORAL_CAPSULE | Freq: Two times a day (BID) | ORAL | Status: DC
  Administered 2013-04-17 – 2013-04-21 (×7): 1 g via ORAL
  Filled 2013-04-17 (×9): qty 1

## 2013-04-17 MED ORDER — TRAMADOL HCL 50 MG PO TABS
50.0000 mg | ORAL_TABLET | Freq: Four times a day (QID) | ORAL | Status: DC | PRN
  Administered 2013-04-18 (×4): 50 mg via ORAL
  Filled 2013-04-17 (×4): qty 1

## 2013-04-17 MED ORDER — MENTHOL 3 MG MT LOZG: 1.0000 | LOZENGE | OROMUCOSAL | Status: DC | PRN

## 2013-04-17 MED ORDER — ONDANSETRON HCL 4 MG/2ML IJ SOLN: 4.0000 mg | Freq: Once | INTRAMUSCULAR | Status: DC | PRN

## 2013-04-17 MED ORDER — LIDOCAINE HCL 4 % MT SOLN
OROMUCOSAL | Status: DC | PRN
  Administered 2013-04-17: 4 mL via TOPICAL

## 2013-04-17 MED ORDER — VITAMIN D3 50 MCG (2000 UT) PO TABS: 1.0000 | ORAL_TABLET | Freq: Every day | ORAL | Status: DC

## 2013-04-17 MED ORDER — ONDANSETRON HCL 4 MG/2ML IJ SOLN
INTRAMUSCULAR | Status: DC | PRN
  Administered 2013-04-17: 4 mg via INTRAVENOUS

## 2013-04-17 MED ORDER — HYDROMORPHONE HCL PF 1 MG/ML IJ SOLN
INTRAMUSCULAR | Status: AC
  Administered 2013-04-17: 0.5 mg via INTRAVENOUS
  Filled 2013-04-17: qty 1

## 2013-04-17 MED ORDER — WARFARIN - PHARMACIST DOSING INPATIENT: Freq: Every day | Status: DC

## 2013-04-17 MED ORDER — PHENOL 1.4 % MT LIQD: 1.0000 | OROMUCOSAL | Status: DC | PRN

## 2013-04-17 MED ORDER — LORATADINE 10 MG PO TABS
10.0000 mg | ORAL_TABLET | Freq: Every day | ORAL | Status: DC
  Administered 2013-04-18 – 2013-04-21 (×4): 10 mg via ORAL
  Filled 2013-04-17 (×4): qty 1

## 2013-04-17 MED ORDER — LACTATED RINGERS IV SOLN
Freq: Once | INTRAVENOUS | Status: AC
  Administered 2013-04-17: 08:00:00 via INTRAVENOUS

## 2013-04-17 MED ORDER — HYDROMORPHONE HCL PF 1 MG/ML IJ SOLN
0.2500 mg | INTRAMUSCULAR | Status: DC | PRN
  Administered 2013-04-17 (×2): 0.5 mg via INTRAVENOUS

## 2013-04-17 MED ORDER — CEFAZOLIN SODIUM-DEXTROSE 2-3 GM-% IV SOLR
INTRAVENOUS | Status: AC
  Filled 2013-04-17: qty 50

## 2013-04-17 MED ORDER — FENTANYL CITRATE 0.05 MG/ML IJ SOLN
75.0000 ug | Freq: Once | INTRAMUSCULAR | Status: AC
  Filled 2013-04-17: qty 1.5

## 2013-04-17 MED ORDER — BUPIVACAINE-EPINEPHRINE PF 0.25-1:200000 % IJ SOLN
INTRAMUSCULAR | Status: AC
  Filled 2013-04-17: qty 30

## 2013-04-17 MED ORDER — WARFARIN SODIUM 7.5 MG PO TABS
7.5000 mg | ORAL_TABLET | Freq: Once | ORAL | Status: AC
  Administered 2013-04-17: 7.5 mg via ORAL
  Filled 2013-04-17: qty 1

## 2013-04-17 MED ORDER — GLYCOPYRROLATE 0.2 MG/ML IJ SOLN
INTRAMUSCULAR | Status: DC | PRN
  Administered 2013-04-17: 0.4 mg via INTRAVENOUS

## 2013-04-17 MED ORDER — ROCURONIUM BROMIDE 100 MG/10ML IV SOLN
INTRAVENOUS | Status: DC | PRN
  Administered 2013-04-17: 30 mg via INTRAVENOUS

## 2013-04-17 MED ORDER — BISACODYL 10 MG RE SUPP: 10.0000 mg | Freq: Every day | RECTAL | Status: DC | PRN

## 2013-04-17 MED ORDER — SODIUM CHLORIDE 0.9 % IV SOLN
INTRAVENOUS | Status: DC
  Administered 2013-04-17: 17:00:00 via INTRAVENOUS

## 2013-04-17 MED ORDER — ACETAMINOPHEN 325 MG PO TABS
650.0000 mg | ORAL_TABLET | Freq: Four times a day (QID) | ORAL | Status: DC | PRN
  Administered 2013-04-19 (×2): 650 mg via ORAL
  Filled 2013-04-17 (×2): qty 2

## 2013-04-17 MED ORDER — ENOXAPARIN SODIUM 30 MG/0.3ML ~~LOC~~ SOLN
30.0000 mg | Freq: Two times a day (BID) | SUBCUTANEOUS | Status: DC
  Administered 2013-04-18 – 2013-04-20 (×6): 30 mg via SUBCUTANEOUS
  Filled 2013-04-17 (×9): qty 0.3

## 2013-04-17 MED ORDER — SODIUM CHLORIDE 0.9 % IR SOLN
Status: DC | PRN
  Administered 2013-04-17: 1000 mL
  Administered 2013-04-17: 3000 mL

## 2013-04-17 MED ORDER — GABAPENTIN 300 MG PO CAPS
600.0000 mg | ORAL_CAPSULE | Freq: Two times a day (BID) | ORAL | Status: DC
  Administered 2013-04-17 – 2013-04-21 (×8): 600 mg via ORAL
  Filled 2013-04-17 (×9): qty 2

## 2013-04-17 MED ORDER — CEFAZOLIN SODIUM-DEXTROSE 2-3 GM-% IV SOLR
2.0000 g | Freq: Four times a day (QID) | INTRAVENOUS | Status: AC
  Administered 2013-04-17 (×2): 2 g via INTRAVENOUS
  Filled 2013-04-17 (×2): qty 50

## 2013-04-17 MED ORDER — ASPIRIN 81 MG PO CHEW
81.0000 mg | CHEWABLE_TABLET | Freq: Every day | ORAL | Status: DC
  Administered 2013-04-17 – 2013-04-21 (×4): 81 mg via ORAL
  Filled 2013-04-17 (×5): qty 1

## 2013-04-17 MED ORDER — METHOCARBAMOL 500 MG PO TABS
ORAL_TABLET | ORAL | Status: AC
  Administered 2013-04-17: 500 mg via ORAL
  Filled 2013-04-17: qty 1

## 2013-04-17 MED ORDER — FLUTICASONE PROPIONATE 50 MCG/ACT NA SUSP
2.0000 | Freq: Every day | NASAL | Status: DC
  Administered 2013-04-20: 2 via NASAL
  Filled 2013-04-17: qty 16

## 2013-04-17 MED ORDER — VITAMIN D3 25 MCG (1000 UNIT) PO TABS
2000.0000 [IU] | ORAL_TABLET | Freq: Every day | ORAL | Status: DC
  Administered 2013-04-18 – 2013-04-21 (×4): 2000 [IU] via ORAL
  Filled 2013-04-17 (×4): qty 2

## 2013-04-17 MED ORDER — FENTANYL CITRATE 0.05 MG/ML IJ SOLN
INTRAMUSCULAR | Status: AC
  Administered 2013-04-17: 75 ug via INTRAVENOUS
  Filled 2013-04-17: qty 2

## 2013-04-17 MED ORDER — WARFARIN VIDEO: Freq: Once | Status: DC

## 2013-04-17 MED ORDER — NEOSTIGMINE METHYLSULFATE 1 MG/ML IJ SOLN
INTRAMUSCULAR | Status: DC | PRN
  Administered 2013-04-17: 3 mg via INTRAVENOUS

## 2013-04-17 MED ORDER — MORPHINE SULFATE 4 MG/ML IJ SOLN
4.0000 mg | INTRAMUSCULAR | Status: DC | PRN
  Administered 2013-04-17 – 2013-04-19 (×2): 4 mg via INTRAVENOUS
  Filled 2013-04-17 (×2): qty 1

## 2013-04-17 MED ORDER — ONDANSETRON HCL 4 MG PO TABS: 4.0000 mg | ORAL_TABLET | Freq: Four times a day (QID) | ORAL | Status: DC | PRN

## 2013-04-17 MED ORDER — METHOCARBAMOL 100 MG/ML IJ SOLN
500.0000 mg | Freq: Four times a day (QID) | INTRAVENOUS | Status: DC | PRN
  Filled 2013-04-17: qty 5

## 2013-04-17 MED ORDER — METHOCARBAMOL 500 MG PO TABS
500.0000 mg | ORAL_TABLET | Freq: Four times a day (QID) | ORAL | Status: DC | PRN
  Administered 2013-04-17 – 2013-04-19 (×6): 500 mg via ORAL
  Filled 2013-04-17 (×7): qty 1

## 2013-04-17 MED ORDER — ACETAMINOPHEN 650 MG RE SUPP: 650.0000 mg | Freq: Four times a day (QID) | RECTAL | Status: DC | PRN

## 2013-04-17 MED ORDER — METOCLOPRAMIDE HCL 10 MG PO TABS: 5.0000 mg | ORAL_TABLET | Freq: Three times a day (TID) | ORAL | Status: DC | PRN

## 2013-04-17 MED ORDER — OXYCODONE HCL 5 MG PO TABS
ORAL_TABLET | ORAL | Status: AC
  Filled 2013-04-17: qty 2

## 2013-04-17 MED ORDER — PROPOFOL 10 MG/ML IV BOLUS
INTRAVENOUS | Status: DC | PRN
  Administered 2013-04-17: 160 mg via INTRAVENOUS

## 2013-04-17 MED ORDER — ONDANSETRON HCL 4 MG/2ML IJ SOLN: 4.0000 mg | Freq: Four times a day (QID) | INTRAMUSCULAR | Status: DC | PRN

## 2013-04-17 MED ORDER — METOCLOPRAMIDE HCL 5 MG/ML IJ SOLN: 5.0000 mg | Freq: Three times a day (TID) | INTRAMUSCULAR | Status: DC | PRN

## 2013-04-17 MED ORDER — ADULT MULTIVITAMIN W/MINERALS CH
1.0000 | ORAL_TABLET | Freq: Every day | ORAL | Status: DC
  Administered 2013-04-19 – 2013-04-21 (×3): 1 via ORAL
  Filled 2013-04-17 (×4): qty 1

## 2013-04-17 MED ORDER — OXYCODONE HCL 5 MG PO TABS
5.0000 mg | ORAL_TABLET | ORAL | Status: DC | PRN
  Administered 2013-04-17 – 2013-04-21 (×19): 10 mg via ORAL
  Filled 2013-04-17 (×18): qty 2

## 2013-04-17 MED ORDER — CEFAZOLIN SODIUM-DEXTROSE 2-3 GM-% IV SOLR
2.0000 g | INTRAVENOUS | Status: AC
  Administered 2013-04-17: 2 g via INTRAVENOUS

## 2013-04-17 SURGICAL SUPPLY — 67 items
BANDAGE ELASTIC 4 VELCRO ST LF (GAUZE/BANDAGES/DRESSINGS) IMPLANT
BANDAGE ESMARK 6X9 LF (GAUZE/BANDAGES/DRESSINGS) ×1 IMPLANT
BANDAGE GAUZE ELAST BULKY 4 IN (GAUZE/BANDAGES/DRESSINGS) ×2 IMPLANT
BLADE SAG 18X100X1.27 (BLADE) ×2 IMPLANT
BLADE SAW SGTL 13.0X1.19X90.0M (BLADE) ×2 IMPLANT
BNDG ELASTIC 6X10 VLCR STRL LF (GAUZE/BANDAGES/DRESSINGS) IMPLANT
BNDG ESMARK 6X9 LF (GAUZE/BANDAGES/DRESSINGS) ×2
BOWL SMART MIX CTS (DISPOSABLE) ×2 IMPLANT
CAPT RP KNEE ×2 IMPLANT
CEMENT HV SMART SET (Cement) ×4 IMPLANT
CLOTH BEACON ORANGE TIMEOUT ST (SAFETY) ×2 IMPLANT
CLSR STERI-STRIP ANTIMIC 1/2X4 (GAUZE/BANDAGES/DRESSINGS) ×4 IMPLANT
COVER SURGICAL LIGHT HANDLE (MISCELLANEOUS) ×2 IMPLANT
CUFF TOURNIQUET SINGLE 34IN LL (TOURNIQUET CUFF) IMPLANT
CUFF TOURNIQUET SINGLE 44IN (TOURNIQUET CUFF) IMPLANT
DRAPE EXTREMITY T 121X128X90 (DRAPE) ×2 IMPLANT
DRAPE PROXIMA HALF (DRAPES) ×2 IMPLANT
DRAPE U-SHAPE 47X51 STRL (DRAPES) ×2 IMPLANT
DRSG ADAPTIC 3X8 NADH LF (GAUZE/BANDAGES/DRESSINGS) IMPLANT
DRSG PAD ABDOMINAL 8X10 ST (GAUZE/BANDAGES/DRESSINGS) IMPLANT
DURAPREP 26ML APPLICATOR (WOUND CARE) ×4 IMPLANT
ELECT CAUTERY BLADE 6.4 (BLADE) ×2 IMPLANT
ELECT REM PT RETURN 9FT ADLT (ELECTROSURGICAL) ×2
ELECTRODE REM PT RTRN 9FT ADLT (ELECTROSURGICAL) ×1 IMPLANT
FACESHIELD LNG OPTICON STERILE (SAFETY) ×2 IMPLANT
GLOVE BIO SURGEON STRL SZ8 (GLOVE) ×2 IMPLANT
GLOVE BIOGEL PI IND STRL 6.5 (GLOVE) ×1 IMPLANT
GLOVE BIOGEL PI IND STRL 8 (GLOVE) ×1 IMPLANT
GLOVE BIOGEL PI INDICATOR 6.5 (GLOVE) ×1
GLOVE BIOGEL PI INDICATOR 8 (GLOVE) ×1
GLOVE BIOGEL PI ORTHO PRO 7.5 (GLOVE)
GLOVE BIOGEL PI ORTHO PRO SZ7 (GLOVE) ×1
GLOVE BIOGEL PI ORTHO PRO SZ8 (GLOVE) ×1
GLOVE ECLIPSE 7.0 STRL STRAW (GLOVE) ×2 IMPLANT
GLOVE ORTHO TXT STRL SZ7.5 (GLOVE) IMPLANT
GLOVE PI ORTHO PRO STRL 7.5 (GLOVE) IMPLANT
GLOVE PI ORTHO PRO STRL SZ7 (GLOVE) ×1 IMPLANT
GLOVE PI ORTHO PRO STRL SZ8 (GLOVE) ×1 IMPLANT
GLOVE SURG ORTHO 8.5 STRL (GLOVE) ×2 IMPLANT
GLOVE SURG SS PI 6.5 STRL IVOR (GLOVE) ×2 IMPLANT
GOWN STRL NON-REIN LRG LVL3 (GOWN DISPOSABLE) IMPLANT
GOWN STRL REIN XL XLG (GOWN DISPOSABLE) IMPLANT
HANDPIECE INTERPULSE COAX TIP (DISPOSABLE) ×1
IMMOBILIZER KNEE 22 UNIV (SOFTGOODS) ×2 IMPLANT
KIT BASIN OR (CUSTOM PROCEDURE TRAY) ×2 IMPLANT
KIT MANIFOLD (MISCELLANEOUS) ×2 IMPLANT
KIT ROOM TURNOVER OR (KITS) ×2 IMPLANT
MANIFOLD NEPTUNE II (INSTRUMENTS) ×2 IMPLANT
NS IRRIG 1000ML POUR BTL (IV SOLUTION) ×2 IMPLANT
PACK TOTAL JOINT (CUSTOM PROCEDURE TRAY) ×2 IMPLANT
PAD ARMBOARD 7.5X6 YLW CONV (MISCELLANEOUS) ×4 IMPLANT
SET HNDPC FAN SPRY TIP SCT (DISPOSABLE) ×1 IMPLANT
SPONGE GAUZE 4X4 12PLY (GAUZE/BANDAGES/DRESSINGS) IMPLANT
STRIP CLOSURE SKIN 1/2X4 (GAUZE/BANDAGES/DRESSINGS) IMPLANT
SUCTION FRAZIER TIP 10 FR DISP (SUCTIONS) ×2 IMPLANT
SUT MNCRL AB 3-0 PS2 18 (SUTURE) IMPLANT
SUT VIC AB 0 CT1 27 (SUTURE) ×2
SUT VIC AB 0 CT1 27XBRD ANBCTR (SUTURE) ×2 IMPLANT
SUT VIC AB 1 CT1 27 (SUTURE) ×2
SUT VIC AB 1 CT1 27XBRD ANBCTR (SUTURE) ×2 IMPLANT
SUT VIC AB 2-0 CT1 27 (SUTURE) ×2
SUT VIC AB 2-0 CT1 TAPERPNT 27 (SUTURE) ×2 IMPLANT
SUT VLOC 180 0 24IN GS25 (SUTURE) ×2 IMPLANT
TOWEL OR 17X24 6PK STRL BLUE (TOWEL DISPOSABLE) ×2 IMPLANT
TOWEL OR 17X26 10 PK STRL BLUE (TOWEL DISPOSABLE) ×2 IMPLANT
TRAY FOLEY CATH 16FRSI W/METER (SET/KITS/TRAYS/PACK) ×2 IMPLANT
WATER STERILE IRR 1000ML POUR (IV SOLUTION) ×4 IMPLANT

## 2013-04-17 NOTE — Anesthesia Postprocedure Evaluation (Signed)
  Anesthesia Post-op Note  Patient: Brian Ellison  Procedure(s) Performed: Procedure(s): RIGHT TOTAL KNEE ARTHROPLASTY (Right)  Patient Location: PACU  Anesthesia Type:GA combined with regional for post-op pain  Level of Consciousness: awake, alert , oriented and patient cooperative  Airway and Oxygen Therapy: Patient Spontanous Breathing  Post-op Pain: mild  Post-op Assessment: Post-op Vital signs reviewed, Patient's Cardiovascular Status Stable, Respiratory Function Stable, Patent Airway, No signs of Nausea or vomiting and Pain level controlled  Post-op Vital Signs: stable  Complications: No apparent anesthesia complications

## 2013-04-17 NOTE — Progress Notes (Signed)
UR COMPLETED  

## 2013-04-17 NOTE — Progress Notes (Signed)
ANTICOAGULATION CONSULT NOTE - Initial Consult  Pharmacy Consult for warfarin Indication: VTE prophylaxis  No Known Allergies  Patient Measurements: Height: 6' (182.9 cm) Weight: 255 lb 11.7 oz (116 kg) IBW/kg (Calculated) : 77.6   Vital Signs: Temp: 98.4 F (36.9 C) (08/08 1545) BP: 112/58 mmHg (08/08 1545) Pulse Rate: 78 (08/08 1545)  Labs:  Recent Labs  04/17/13 1621  HGB 13.0  HCT 39.6  PLT 170    Estimated Creatinine Clearance: 101.5 ml/min (by C-G formula based on Cr of 0.84).   Medical History: Past Medical History  Diagnosis Date  . Dyslipidemia     per pt questionable  . Allergic rhinitis     uses Nasal Spray prn and takes Allegra daily  . GERD (gastroesophageal reflux disease)     takes Omeprazole daily  . Sleep apnea     uses CPAP  . Pneumonia     hx of;last time 63yrs ago  . Tingling     right arm;takes Gabapentin  . Arthritis   . Joint pain   . Joint swelling   . Back pain     DDD  . Parkinson's disease   . History of colon polyps   . Diverticulosis   . History of kidney stones     many yrs ago  . Glaucoma   . History of shingles     Assessment: 58 yom s/p right total knee arthroplasty. Orders to initiate warfarin therapy for vte prophylaxis with lovenox bridge post-op. Baseline INR was 0.93 and patient was not on anticoagulation prior to admission. Calculated warfarin score of 6.  Goal of Therapy:  INR 2-3 Monitor platelets by anticoagulation protocol: Yes   Plan:  Warfarin 7.5 mg tonight Daily INR Warfarin education materials ordered  Sheppard Coil PharmD., BCPS Clinical Pharmacist Pager [phone] 04/17/2013 4:57 PM

## 2013-04-17 NOTE — Anesthesia Procedure Notes (Addendum)
Procedure Name: Intubation Date/Time: 04/17/2013 8:56 AM Performed by: Quentin Ore Pre-anesthesia Checklist: Patient identified, Emergency Drugs available, Suction available, Patient being monitored and Timeout performed Patient Re-evaluated:Patient Re-evaluated prior to inductionOxygen Delivery Method: Circle system utilized Preoxygenation: Pre-oxygenation with 100% oxygen Intubation Type: IV induction Ventilation: Mask ventilation without difficulty Laryngoscope Size: Mac and 3 Grade View: Grade I Tube type: Oral Tube size: 7.5 mm Number of attempts: 1 Airway Equipment and Method: Stylet and LTA kit utilized Placement Confirmation: ETT inserted through vocal cords under direct vision,  positive ETCO2 and breath sounds checked- equal and bilateral Secured at: 22 cm Tube secured with: Tape Dental Injury: Teeth and Oropharynx as per pre-operative assessment    Anesthesia Regional Block:  Femoral nerve block  Pre-Anesthetic Checklist: ,, timeout performed, Correct Patient, Correct Site, Correct Laterality, Correct Procedure, Correct Position, site marked, Risks and benefits discussed,  Surgical consent,  Pre-op evaluation,  At surgeon's request and post-op pain management  Laterality: Right  Prep: chloraprep and alcohol swabs       Needles:  Injection technique: Single-shot      Additional Needles:  Procedures: nerve stimulator Femoral nerve block  Nerve Stimulator or Paresthesia:  Response: 0.5 mA, 0.1 ms, 4 cm  Additional Responses:   Narrative:  Start time: 04/17/2013 7:10 AM End time: 04/17/2013 7:15 AM Injection made incrementally with aspirations every 5 mL.  Performed by: Personally  Anesthesiologist: Maren Beach MD  Additional Notes: Pt accepts procedure and risks. 20cc 0.5% Marcaine w/epi w/o difficulty or discomfort. GES

## 2013-04-17 NOTE — Anesthesia Preprocedure Evaluation (Signed)
Anesthesia Evaluation  Patient identified by MRN, date of birth, ID band Patient awake    Reviewed: Allergy & Precautions, H&P , NPO status , Patient's Chart, lab work & pertinent test results  Airway Mallampati: II TM Distance: >3 FB Neck ROM: full    Dental   Pulmonary sleep apnea ,          Cardiovascular Rhythm:regular Rate:Normal     Neuro/Psych    GI/Hepatic GERD-  ,  Endo/Other    Renal/GU      Musculoskeletal   Abdominal   Peds  Hematology   Anesthesia Other Findings Back pain  Reproductive/Obstetrics                           Anesthesia Physical Anesthesia Plan  ASA: III  Anesthesia Plan: General   Post-op Pain Management:    Induction:   Airway Management Planned: Oral ETT  Additional Equipment:   Intra-op Plan:   Post-operative Plan: Extubation in OR  Informed Consent: I have reviewed the patients History and Physical, chart, labs and discussed the procedure including the risks, benefits and alternatives for the proposed anesthesia with the patient or authorized representative who has indicated his/her understanding and acceptance.     Plan Discussed with: CRNA, Anesthesiologist and Surgeon  Anesthesia Plan Comments:         Anesthesia Quick Evaluation

## 2013-04-17 NOTE — H&P (View-Only) (Signed)
Brian Ellison is an 72 y.o. male.    Chief Complaint: right knee pain   HPI: Pt is a 72 y.o. male complaining of right knee pain for multiple years. Pain had continually increased since the beginning. X-rays in the clinic show end-stage arthritic changes of the right knee. Pt has tried various conservative treatments which have failed to alleviate their symptoms, including shots and therapy. Various options are discussed with the patient. Risks, benefits and expectations were discussed with the patient. Patient understand the risks, benefits and expectations and wishes to proceed with surgery.   PCP:  BURNETT,BRENT A, MD  D/C Plans:  Home with HHPT  PMH: Past Medical History  Diagnosis Date  . Sleep apnea   . Dyslipidemia   . Allergic rhinitis     PSH: No past surgical history on file.  Social History:  reports that he quit smoking about 42 years ago. His smoking use included Cigarettes. He has a 15 pack-year smoking history. He does not have any smokeless tobacco history on file. His alcohol and drug histories are not on file.  Allergies:  No Known Allergies  Medications: Current Outpatient Prescriptions  Medication Sig Dispense Refill  . aspirin 81 MG tablet Take 81 mg by mouth daily.        . Cholecalciferol (VITAMIN D3) 2000 UNITS TABS Take 1 tablet by mouth daily.        . fexofenadine (ALLEGRA) 180 MG tablet Take 180 mg by mouth daily.        . flunisolide (NASAREL) 29 MCG/ACT (0.025%) nasal spray Place 2 sprays into the nose as needed. Dose is for each nostril.       . gabapentin (NEURONTIN) 300 MG capsule Take 600 mg by mouth 2 (two) times daily.        . metFORMIN (GLUCOPHAGE) 500 MG tablet Take 500 mg by mouth daily with breakfast.      . Multiple Vitamin (MULTIVITAMIN) capsule Take 1 capsule by mouth daily.        . naproxen (NAPROSYN) 500 MG tablet Take 500 mg by mouth daily.       . Omega-3 Fatty Acids (FISH OIL) 1200 MG CAPS Take 1 capsule by mouth daily.          . omeprazole (PRILOSEC) 20 MG capsule Take 20 mg by mouth daily.        . rOPINIRole (REQUIP) 1 MG tablet Take 1 mg by mouth at bedtime.        . simvastatin (ZOCOR) 20 MG tablet Take 10 mg by mouth at bedtime.         No current facility-administered medications for this visit.    No results found for this or any previous visit (from the past 48 hour(s)). No results found.  ROS: Pain with rom of the right lower extremity  Physical Exam: BP:   118/78  ;  HR:   78  ; Resp:   12  : Alert and oriented 72 y.o. male in no acute distress Cranial nerves 2-12 intact Cervical spine: full rom with no tenderness, nv intact distally Chest: active breath sounds bilaterally, no wheeze rhonchi or rales Heart: regular rate and rhythm, no murmur Abd: non tender non distended with active bowel sounds Hip is stable with rom  Right knee no effusion nv intact distally Moderate medial joint line tenderness Antalgic gait  Assessment/Plan Assessment: right knee end stage osteoarthritis  Plan: Patient will undergo a right knee total arthroplasty by Dr. Norris   at [place]. Risks benefits and expectations were discussed with the patient. Patient understand risks, benefits and expectations and wishes to proceed.   

## 2013-04-17 NOTE — Transfer of Care (Signed)
Immediate Anesthesia Transfer of Care Note  Patient: Brian Ellison  Procedure(s) Performed: Procedure(s): RIGHT TOTAL KNEE ARTHROPLASTY (Right)  Patient Location: PACU  Anesthesia Type:General  Level of Consciousness: awake, alert  and oriented  Airway & Oxygen Therapy: Patient Spontanous Breathing and Patient connected to face mask oxygen  Post-op Assessment: Report given to PACU RN, Post -op Vital signs reviewed and stable and Patient moving all extremities X 4  Post vital signs: Reviewed and stable  Complications: No apparent anesthesia complications

## 2013-04-17 NOTE — Interval H&P Note (Signed)
History and Physical Interval Note:  04/17/2013 8:28 AM  Brian Ellison  has presented today for surgery, with the diagnosis of Right Knee Osteoarthritis  The various methods of treatment have been discussed with the patient and family. After consideration of risks, benefits and other options for treatment, the patient has consented to  Procedure(s): RIGHT TOTAL KNEE ARTHROPLASTY (Right) as a surgical intervention .  The patient's history has been reviewed, patient examined, no change in status, stable for surgery.  I have reviewed the patient's chart and labs.  Questions were answered to the patient's satisfaction.     Dewitte Vannice,STEVEN R

## 2013-04-17 NOTE — Interval H&P Note (Signed)
History and Physical Interval Note:  04/17/2013 8:28 AM  Brian Ellison  has presented today for surgery, with the diagnosis of Right Knee Osteoarthritis  The various methods of treatment have been discussed with the patient and family. After consideration of risks, benefits and other options for treatment, the patient has consented to  Procedure(s): RIGHT TOTAL KNEE ARTHROPLASTY (Right) as a surgical intervention .  The patient's history has been reviewed, patient examined, no change in status, stable for surgery.  I have reviewed the patient's chart and labs.  Questions were answered to the patient's satisfaction.     Khaleed Holan,STEVEN R   

## 2013-04-17 NOTE — Preoperative (Signed)
Beta Blockers   Reason not to administer Beta Blockers:Not Applicable 

## 2013-04-17 NOTE — Brief Op Note (Signed)
04/17/2013  10:56 AM  PATIENT:  Brian Ellison  72 y.o. male  PRE-OPERATIVE DIAGNOSIS:  Right Knee Osteoarthritis, end stage  POST-OPERATIVE DIAGNOSIS:  Right Knee Osteoarthritis, end stage  PROCEDURE:  Procedure(s): RIGHT TOTAL KNEE ARTHROPLASTY (Right), DePuy Sigma RP  SURGEON:  Surgeon(s) and Role:    * Verlee Rossetti, MD - Primary  PHYSICIAN ASSISTANT:   ASSISTANTS: Leilani Able PA-C  ANESTHESIA:   regional  And general  EBL:  Total I/O In: 1000 [I.V.:1000] Out: 325 [Urine:300; Blood:25]  BLOOD ADMINISTERED:none  DRAINS: none   LOCAL MEDICATIONS USED:  NONE  SPECIMEN:  No Specimen  DISPOSITION OF SPECIMEN:  N/A  COUNTS:  YES  TOURNIQUET:   Total Tourniquet Time Documented: Thigh (Right) - 93 minutes Total: Thigh (Right) - 93 minutes   DICTATION: .Other Dictation: Dictation Number [id number]  PLAN OF CARE: Admit to inpatient   PATIENT DISPOSITION:  PACU - hemodynamically stable.   Delay start of Pharmacological VTE agent (>24hrs) due to surgical blood loss or risk of bleeding: no

## 2013-04-17 NOTE — Progress Notes (Signed)
Orthopedic Tech Progress Note Patient Details:  Brian Ellison December 28, 1940 [id number] CPM applied to Right LE with appropriate settings. OHF applied to bed.  CPM Right Knee CPM Right Knee: On Right Knee Flexion (Degrees): 60 Right Knee Extension (Degrees): 0   Asia R Thompson 04/17/2013, 12:54 PM

## 2013-04-18 LAB — BASIC METABOLIC PANEL
BUN: 10 mg/dL (ref 6–23)
Calcium: 8.5 mg/dL (ref 8.4–10.5)
Creatinine, Ser: 0.73 mg/dL (ref 0.50–1.35)
GFR calc Af Amer: >90 mL/min (ref >90)
GFR calc non Af Amer: 89 mL/min — ABNORMAL LOW (ref >90)
Glucose, Bld: 174 mg/dL — ABNORMAL HIGH (ref 70–99)

## 2013-04-18 LAB — CBC
HCT: 38.9 % — ABNORMAL LOW (ref 39.0–52.0)
Hemoglobin: 12.5 g/dL — ABNORMAL LOW (ref 13.0–17.0)
MCH: 28.1 pg (ref 26.0–34.0)
MCHC: 32.1 g/dL (ref 30.0–36.0)
RDW: 13.8 % (ref 11.5–15.5)

## 2013-04-18 LAB — PROTIME-INR
INR: 1.14 (ref 0.00–1.49)
Prothrombin Time: 14.4 seconds (ref 11.6–15.2)

## 2013-04-18 MED ORDER — WARFARIN SODIUM 7.5 MG PO TABS
7.5000 mg | ORAL_TABLET | Freq: Once | ORAL | Status: AC
  Administered 2013-04-18: 7.5 mg via ORAL
  Filled 2013-04-18: qty 1

## 2013-04-18 NOTE — Op Note (Signed)
NAMEANGUEL, DELAPENA NO.:  [id number]  MEDICAL RECORD NO.:  [id number]  LOCATION:  5N10C                        FACILITY:  [facility]  PHYSICIAN:  Almedia Balls. Ranell Patrick, M.D. DATE OF BIRTH:  [birth year]  DATE OF PROCEDURE:  04/17/2013 DATE OF DISCHARGE:                              OPERATIVE REPORT   PREOPERATIVE DIAGNOSIS:  Right knee end-stage osteoarthritis.  POSTOPERATIVE DIAGNOSIS:  Right knee end-stage osteoarthritis.  PROCEDURE PERFORMED:  Right total knee replacement using DePuy Sigma rotating platform prosthesis.  ATTENDING SURGEON:  Almedia Balls. Ranell Patrick, MD.  ASSISTANT:  Gigi Gin [facility] who was scrubbed in the entire procedure and necessary for satisfactory completion of surgery.  General anesthesia was used plus femoral block.  ESTIMATED BLOOD LOSS:  Minimal.  FLUID REPLACEMENT:  1500 mL of crystalloid.  INSTRUMENT COUNTS:  Correct.  COMPLICATIONS:  There were no complications.  ANTIBIOTICS:  Perioperative antibiotics given.  INDICATIONS:  The patient is a 72 year old male, who is status post left total knee replacement.  He presents with end-stage arthritis in his right knee.  The patient has bone-on-bone with inability walk beyond 2 blocks secondary to pain having to sit down.  He has failed all measures of conservative management including modification activity injections, anti-inflammatory, pain medications.  The patient presents for operative total knee arthroplasty, restore function, eliminate pain to his knee. Informed consent obtained.  DESCRIPTION OF PROCEDURE:  After adequate level of anesthesia, the patient was positioned in the supine position.  Right leg correctly identified.  Nonsterile tourniquet placed on proximal thigh.  Left leg was padded appropriately secured after sterile prep and drape of the knee, we called a time-out.  We then began surgery with elevating the limb exsanguinated using Esmarch bandage.  We then went ahead and  flexed the knee and made our midline incision after elevating the tourniquet to 300 mmHg.  A longitudinal midline incision was created with a 10 blade scalpel dissected down through subcutaneous tissues.  I identified the medial parapatellar area and performed a medial parapatellar arthrotomy with fresh 10 blade scalpel.  We divided the lateral patellofemoral ligaments everted the patella.  We flexed the knee and then entered the distal femur using just a step-cut drill.  We then irrigated the knee, the femoral canal and then placed an intramedullary resection guide for the distal femoral cut set on 5 degrees right with a 10 mm resection we resected.  Using oscillating saw, we then sized the femur to a size 4 anterior down, and then placed our 4-in-1 block cutting our anterior and posterior and chamfer cuts.  Next, resected PCL, ACL, and meniscal tissue.  We subluxed the tibia anteriorly, and then made our tibial cut 90 degrees perpendicular long axis of the tibia.  We went ahead and checked our gaps which were symmetric at 10 mm when resected extra posterior bone off the femur.  Released posterior capsule off the notch and then went ahead and finished our preparation of the tibia with a modular drilling keel punch, and then went to the femur with the box cut guide and resected for the posterior cruciate substituting femoral prosthesis.  Once that resection was complete, we impacted the  size 4 femur.  We reduced the knee with size 10 trial poly and were happy with our full extension and/or flexion stability.  We then resurfaced the patella, starting at about a 24 mm thickness down to 16 mm thickness and then drilled our lugs for the size 38 patella.  Once we had that 38 patella in place, we ranged the knee and had nice patellar tracking with a no touch technique.  Removed all trial components.  We pulse irrigated the knee thoroughly.  We drilled a couple hard areas on the medial tibial  condyle and also on the patella just for interdigitation of cement.  We then thoroughly dried the bone, and then cemented the components into place with hard viscosity cement by DePuy with 3rd generation vacuum mixing techniques.  Once we had the components in place, we placed the knee in full extension under pressure and then cemented the patella in place and then placed a patellar clamp and held until the cement was hard.  We removed excess cement using quarter-inch curved osteotome, and we inspected the posterior aspect of the knee to make sure all cement was removed.  We then went ahead and irrigated thoroughly with pulse irrigator and then closed the knee with interrupted #1 Vicryl suture, and we also did a V-Loc suture as well over the top of that and then 0 and 2-0 Vicryl layered subcu closure, and 4-0 running Monocryl for skin.  Steri-Strips applied followed by sterile compressive bandage.  The patient tolerated the procedure well.     Almedia Balls. Ranell Patrick, M.D.     SRN/MEDQ  D:  04/17/2013  T:  04/18/2013  Job:  [id number]

## 2013-04-18 NOTE — Evaluation (Signed)
Occupational Therapy Evaluation Patient Details Name: Brian Ellison MRN: [id number] DOB: 09/20/1940 Today's Date: 04/18/2013 Time: 5621-3086 OT Time Calculation (min): 24 min  OT Assessment / Plan / Recommendation History of present illness Patient is a 72 yo male s/p Rt. TKA   Clinical Impression   Pt is requiring +2 assist for OOB mobility.  Limited by pain and nausea this visit.  Pt is dependent in LB ADL, but plans to rely on his wife for assist as he did when he had his R knee replaced.  Recommend SNF for ST rehab at this point.  Will follow acutely.    OT Assessment  Patient needs continued OT Services    Follow Up Recommendations  SNF    Barriers to Discharge      Equipment Recommendations  None recommended by OT    Recommendations for Other Services    Frequency  Min 2X/week    Precautions / Restrictions Precautions Precautions: Fall Precaution Comments: KI Required Braces or Orthoses: Knee Immobilizer - Right Knee Immobilizer - Right: On at all times Restrictions Weight Bearing Restrictions: Yes RLE Weight Bearing: Weight bearing as tolerated   Pertinent Vitals/Pain 7/10 R knee and hip with movement, RN notified, repositioned    ADL  Eating/Feeding: Independent Where Assessed - Eating/Feeding: Bed level Grooming: Wash/dry hands;Set up Where Assessed - Grooming: Supported sitting Upper Body Bathing: Set up Where Assessed - Upper Body Bathing: Unsupported sitting Lower Body Bathing: +1 Total assistance Where Assessed - Lower Body Bathing: Unsupported sitting;Supported sit to stand Upper Body Dressing: Set up Where Assessed - Upper Body Dressing: Unsupported sitting Lower Body Dressing: +1 Total assistance Where Assessed - Lower Body Dressing: Unsupported sitting;Supported sit to stand Toilet Transfer: +2 Total assistance Toilet Transfer: Patient Percentage: 50% Equipment Used: Gait belt;Rolling walker Transfers/Ambulation Related to ADLs: +2 total assist,  pt 50 % ADL Comments: Wife assisted pt with LB ADL last TKA 4 years ago    OT Diagnosis: Generalized weakness;Acute pain  OT Problem List: Decreased strength;Decreased activity tolerance;Impaired balance (sitting and/or standing);Decreased knowledge of use of DME or AE;Obesity;Pain OT Treatment Interventions: Self-care/ADL training;DME and/or AE instruction;Therapeutic activities;Patient/family education   OT Goals(Current goals can be found in the care plan section) Acute Rehab OT Goals Patient Stated Goal: home with wife OT Goal Formulation: With patient Time For Goal Achievement: 05/02/13 Potential to Achieve Goals: Good ADL Goals Pt Will Perform Grooming: with min assist;standing Pt Will Transfer to Toilet: with min assist;bedside commode Pt Will Perform Toileting - Clothing Manipulation and hygiene: with min assist;sit to/from stand  Visit Information  Last OT Received On: 04/18/13 Assistance Needed: +2 PT/OT Co-Evaluation/Treatment: Yes History of Present Illness: Patient is a 72 yo male s/p Rt. TKA       Prior Functioning     Home Living Family/patient expects to be discharged to:: Private residence Living Arrangements: Spouse/significant other Available Help at Discharge: Family;Available 24 hours/day Type of Home: House Home Access: Stairs to enter [employer] of Steps: 2 Entrance Stairs-Rails: None Home Layout: One level Home Equipment: Bedside commode;Walker - 2 wheels;Cane - quad Prior Function Level of Independence: Independent Comments: used a knee brace PTA Communication Communication: No difficulties Dominant Hand: Right         Vision/Perception Vision - History Patient Visual Report: No change from baseline   Cognition  Cognition Arousal/Alertness: Awake/alert Behavior During Therapy: WFL for tasks assessed/performed Overall Cognitive Status: Within Functional Limits for tasks assessed    Extremity/Trunk Assessment Upper  Extremity Assessment  Upper Extremity Assessment: Overall WFL for tasks assessed Lower Extremity Assessment Lower Extremity Assessment: Defer to PT evaluation Cervical / Trunk Assessment Cervical / Trunk Assessment: Normal     Mobility Bed Mobility Bed Mobility: Supine to Sit;Sitting - Scoot to Edge of Bed Supine to Sit: With rails;HOB elevated;3: Mod assist Sitting - Scoot to Edge of Bed: 3: Mod assist Transfers Transfers: Sit to Stand;Stand to Sit Sit to Stand: 1: +2 Total assist;With upper extremity assist;From bed Sit to Stand: Patient Percentage: 50% Stand to Sit: 1: +2 Total assist;Without upper extremity assist;To chair/3-in-1 Stand to Sit: Patient Percentage: 50% Details for Transfer Assistance: Verbal cues for hand placement.  Required assist to rise to standing from elevated bed.  Patient with difficulty attempting to take steps - assist to pivot to chair.  Assist to move RLE forward and control descent to sit.     Exercise     Balance     End of Session OT - End of Session Activity Tolerance: Treatment limited secondary to medical complications (Comment);Patient limited by pain (nausea) Patient left: in chair;with call bell/phone within reach Nurse Communication: Mobility status;Patient requests pain meds  GO     Evern Bio 04/18/2013, 10:04 AM [phone]

## 2013-04-18 NOTE — Progress Notes (Signed)
ANTICOAGULATION CONSULT NOTE - Initial Consult  Pharmacy Consult for warfarin Indication: VTE prophylaxis  No Known Allergies  Patient Measurements: Height: 6' (182.9 cm) Weight: 255 lb 11.7 oz (116 kg) IBW/kg (Calculated) : 77.6   Vital Signs: Temp: 99.6 F (37.6 C) (08/09 0514) BP: 130/56 mmHg (08/09 0514) Pulse Rate: 120 (08/09 0514)  Labs:  Recent Labs  04/17/13 1621 04/18/13 0555  HGB 13.0 12.5*  HCT 39.6 38.9*  PLT 170 171  LABPROT  --  14.4  INR  --  1.14  CREATININE 0.76 0.73    Estimated Creatinine Clearance: 106.6 ml/min (by C-G formula based on Cr of 0.73).   Medical History: Past Medical History  Diagnosis Date  . Dyslipidemia     per pt questionable  . Allergic rhinitis     uses Nasal Spray prn and takes Allegra daily  . GERD (gastroesophageal reflux disease)     takes Omeprazole daily  . Sleep apnea     uses CPAP  . Pneumonia     hx of;last time 71yrs ago  . Tingling     right arm;takes Gabapentin  . Arthritis   . Joint pain   . Joint swelling   . Back pain     DDD  . Parkinson's disease   . History of colon polyps   . Diverticulosis   . History of kidney stones     many yrs ago  . Glaucoma   . History of shingles     Assessment: 76 yom s/p right total knee arthroplasty. Orders to initiate warfarin therapy for vte prophylaxis with lovenox bridge post-op. Baseline INR was 0.93, and INR this AM is 1.14. Patient was not on anticoagulation prior to admission. Calculated warfarin score of 6.  Will continue current warfarin dose, no signs of bleeds.  Goal of Therapy:  INR 2-3 Monitor platelets by anticoagulation protocol: Yes   Plan:  -Continue Warfarin 7.5 mg tonight -Daily INR -monitor plt, h/h, signs bleeds -Warfarin education    04/18/2013 9:08 AM  Anabel Bene, PharmD Clinical Pharmacist Pager: [phone]

## 2013-04-18 NOTE — Evaluation (Signed)
Physical Therapy Evaluation Patient Details Name: Brian Ellison MRN: [id number] DOB: 1941-05-29 Today's Date: 04/18/2013 Time: 0928-0950 PT Time Calculation (min): 22 min  PT Assessment / Plan / Recommendation History of Present Illness  Patient is a 72 yo male s/p Rt. TKA  Clinical Impression  Patient presents with problems listed below.  Patient's mobility limited by pain, requiring +2 assist to pivot to chair.  Patient will benefit from acute PT to maximize independence prior to discharge.  At this point, do not feel wife can provide level of assist needed for patient at home.  Recommend SNF at discharge for continued therapy.    PT Assessment  Patient needs continued PT services    Follow Up Recommendations  SNF    Does the patient have the potential to tolerate intense rehabilitation      Barriers to Discharge Decreased caregiver support At this point, patient will require more assist than wife can provide.    Equipment Recommendations  None recommended by PT    Recommendations for Other Services     Frequency 7X/week    Precautions / Restrictions Precautions Precautions: Fall Precaution Comments: KI Required Braces or Orthoses: Knee Immobilizer - Right Knee Immobilizer - Right: On except when in CPM Restrictions Weight Bearing Restrictions: Yes RLE Weight Bearing: Weight bearing as tolerated   Pertinent Vitals/Pain Pain 8/10 impacting mobility.      Mobility  Bed Mobility Bed Mobility: Supine to Sit;Sitting - Scoot to Edge of Bed Supine to Sit: With rails;HOB elevated;3: Mod assist Sitting - Scoot to Edge of Bed: 3: Mod assist Details for Bed Mobility Assistance: Verbal cues for technique.  Assist to move RLE off of bed and raise trunk to sitting. Transfers Transfers: Sit to Stand;Stand to Sit;Stand Pivot Transfers Sit to Stand: 1: +2 Total assist;With upper extremity assist;From bed Sit to Stand: Patient Percentage: 50% Stand to Sit: 1: +2 Total  assist;Without upper extremity assist;To chair/3-in-1 Stand to Sit: Patient Percentage: 50% Stand Pivot Transfers: 1: +2 Total assist Stand Pivot Transfers: Patient Percentage: 50% Details for Transfer Assistance: Verbal cues for hand placement.  Required assist to rise to standing from elevated bed.  Patient with difficulty attempting to take steps - assist to pivot to chair.  Assist to move RLE forward and control descent to sit. Ambulation/Gait Ambulation/Gait Assistance: Not tested (comment)    Exercises Total Joint Exercises Ankle Circles/Pumps: AROM;Both;10 reps;Seated   PT Diagnosis: Difficulty walking;Generalized weakness;Acute pain  PT Problem List: Decreased strength;Decreased range of motion;Decreased activity tolerance;Decreased balance;Decreased mobility;Decreased knowledge of use of DME;Decreased knowledge of precautions;Obesity;Pain PT Treatment Interventions: DME instruction;Gait training;Stair training;Functional mobility training;Therapeutic exercise;Patient/family education     PT Goals(Current goals can be found in the care plan section) Acute Rehab PT Goals Patient Stated Goal: To be able to go home. PT Goal Formulation: With patient/family Time For Goal Achievement: 05/02/13 Potential to Achieve Goals: Good  Visit Information  Last PT Received On: 04/18/13 Assistance Needed: +2 PT/OT Co-Evaluation/Treatment: Yes History of Present Illness: Patient is a 72 yo male s/p Rt. TKA       Prior Functioning  Home Living Family/patient expects to be discharged to:: Private residence Living Arrangements: Spouse/significant other Available Help at Discharge: Family;Available 24 hours/day Type of Home: House Home Access: Stairs to enter [employer] of Steps: 2 Entrance Stairs-Rails: None Home Layout: One level Home Equipment: Bedside commode;Walker - 2 wheels;Cane - quad Prior Function Level of Independence: Independent Comments: used a knee brace  PTA Communication Communication: No difficulties  Dominant Hand: Right    Cognition  Cognition Arousal/Alertness: Awake/alert Behavior During Therapy: WFL for tasks assessed/performed Overall Cognitive Status: Within Functional Limits for tasks assessed    Extremity/Trunk Assessment Upper Extremity Assessment Upper Extremity Assessment: Defer to OT evaluation Lower Extremity Assessment Lower Extremity Assessment: Generalized weakness;RLE deficits/detail RLE Deficits / Details: Decreased strength and ROM of knee due to surgery. RLE: Unable to fully assess due to pain;Unable to fully assess due to immobilization Cervical / Trunk Assessment Cervical / Trunk Assessment: Normal   Balance    End of Session PT - End of Session Equipment Utilized During Treatment: Gait belt;Right knee immobilizer Activity Tolerance: Patient limited by pain;Patient limited by fatigue Patient left: in chair;with call bell/phone within reach Nurse Communication: Mobility status;Patient requests pain meds (Nausea) CPM Right Knee CPM Right Knee: Off  GP     Vena Austria 04/18/2013, 11:26 AM  Durenda Hurt. Renaldo Fiddler, [facility] Acute Rehab Services Pager [phone]

## 2013-04-18 NOTE — Progress Notes (Signed)
Orthopedic Tech Progress Note Patient Details:  Brian Ellison 1941-05-26 [id number] On cpm at 8:15 pm RLE 0-65 Patient ID: Brian Ellison, male   DOB: 1941/05/02, 72 y.o.   MRN: [id number]   Brian Ellison 04/18/2013, 8:16 PM

## 2013-04-18 NOTE — Progress Notes (Signed)
Physical Therapy Treatment Patient Details Name: Brian Ellison MRN: [id number] DOB: [birth year] Today's Date: 04/18/2013 Time: 2440-1027 PT Time Calculation (min): 24 min  PT Assessment / Plan / Recommendation  History of Present Illness Patient is a 72 yo male s/p Rt. TKA   PT Comments   Patient continued to require +2 for mobility.  Difficulty performing exercises due to pain.  Follow Up Recommendations  SNF     Does the patient have the potential to tolerate intense rehabilitation     Barriers to Discharge Decreased caregiver support At this point, patient will require more assist than wife can provide.    Equipment Recommendations  None recommended by PT    Recommendations for Other Services    Frequency 7X/week   Progress towards PT Goals Progress towards PT goals: Progressing toward goals  Plan Current plan remains appropriate    Precautions / Restrictions Precautions Precautions: Fall;Knee Precaution Booklet Issued: Yes (comment) Precaution Comments: Reviewed precautions with patient Required Braces or Orthoses: Knee Immobilizer - Right Knee Immobilizer - Right: On except when in CPM Restrictions Weight Bearing Restrictions: Yes RLE Weight Bearing: Weight bearing as tolerated   Pertinent Vitals/Pain Pain continues to limit mobility.    Mobility  Bed Mobility Bed Mobility: Sit to Supine Sit to Supine: 1: +2 Total assist;HOB flat Sit to Supine: Patient Percentage: 50% Details for Bed Mobility Assistance: Verbal cues for technique.  Assist to control trunk and to raise LE's onto bed. Transfers Transfers: Sit to Stand;Stand to Sit;Stand Pivot Transfers Sit to Stand: 1: +2 Total assist;With upper extremity assist;With armrests;From chair/3-in-1 Sit to Stand: Patient Percentage: 50% Stand to Sit: 1: +2 Total assist;With upper extremity assist;To bed Stand to Sit: Patient Percentage: 50% Stand Pivot Transfers: 1: +2 Total assist Stand Pivot Transfers: Patient  Percentage: 50% Details for Transfer Assistance: Verbal cues for hand placement.  Assist to rise to standing.  Patient unable to take steps to pivot to bed.  Pulled bed behind patient and assisted with RLE placement to sit EOB. Ambulation/Gait Ambulation/Gait Assistance: Not tested (comment)    Exercises Total Joint Exercises Ankle Circles/Pumps: AROM;Both;10 reps;Seated Quad Sets: AROM;Right;5 reps;Seated Heel Slides: AAROM;Right;5 reps;Seated Long Arc Quad: AAROM;Right;5 reps;Seated (Minimal quad contraction noted)   PT Diagnosis: Difficulty walking;Generalized weakness;Acute pain  PT Problem List: Decreased strength;Decreased range of motion;Decreased activity tolerance;Decreased balance;Decreased mobility;Decreased knowledge of use of DME;Decreased knowledge of precautions;Obesity;Pain PT Treatment Interventions: DME instruction;Gait training;Stair training;Functional mobility training;Therapeutic exercise;Patient/family education   PT Goals (current goals can now be found in the care plan section) Acute Rehab PT Goals Patient Stated Goal: To be able to go home. PT Goal Formulation: With patient/family Time For Goal Achievement: 05/02/13 Potential to Achieve Goals: Good  Visit Information  Last PT Received On: 04/18/13 Assistance Needed: +2 PT/OT Co-Evaluation/Treatment: Yes History of Present Illness: Patient is a 72 yo male s/p Rt. TKA    Subjective Data  Subjective: Patient c/o increased pain with any knee movement. Patient Stated Goal: To be able to go home.   Cognition  Cognition Arousal/Alertness: Awake/alert Behavior During Therapy: WFL for tasks assessed/performed Overall Cognitive Status: Within Functional Limits for tasks assessed    Balance     End of Session PT - End of Session Equipment Utilized During Treatment: Gait belt;Right knee immobilizer Activity Tolerance: Patient limited by pain;Patient limited by fatigue Patient left: in bed;with call bell/phone  within reach;with nursing/sitter in room;with family/visitor present Nurse Communication: Mobility status CPM Right Knee CPM Right Knee: Off  GP     Vena Austria 04/18/2013, 11:38 AM Durenda Hurt. Renaldo Fiddler, [facility] Acute Rehab Services Pager [phone]

## 2013-04-18 NOTE — Progress Notes (Signed)
Orthopedics Progress Note  Subjective: I am hurting this morning  Objective:  Filed Vitals:   04/18/13 0514  BP: 130/56  Pulse: 120  Temp: 99.6 F (37.6 C)  Resp: 18    General: Awake and alert  Musculoskeletal: Right knee dressing CDI, pain attempting full extension, calf pumps without pain, neg Homans Neurovascularly intact  Lab Results  Component Value Date   WBC 13.1* 04/18/2013   HGB 12.5* 04/18/2013   HCT 38.9* 04/18/2013   MCV 87.4 04/18/2013   PLT 171 04/18/2013       Component Value Date/Time   NA 133* 04/18/2013 0555   K 3.8 04/18/2013 0555   CL 100 04/18/2013 0555   CO2 23 04/18/2013 0555   GLUCOSE 174* 04/18/2013 0555   BUN 10 04/18/2013 0555   CREATININE 0.73 04/18/2013 0555   CALCIUM 8.5 04/18/2013 0555   GFRNONAA 89* 04/18/2013 0555   GFRAA >90 04/18/2013 0555    Lab Results  Component Value Date   INR 1.14 04/18/2013   INR 0.93 04/09/2013   INR 0.97 07/15/2009    Assessment/Plan: POD #1 s/p Procedure(s): RIGHT TOTAL KNEE ARTHROPLASTY Stable overnight Pain control with po and IV meds Heplock IV DVT prophylaxis with Coumadin and Lovenox bridge Mobilization OOB to chair with PT CPM in two hour increments please, -5 deg extension setting to allow for full extension  Acute blood loss anemia, will follow  Brian Ellison. Ranell Patrick, MD 04/18/2013 10:47 AM

## 2013-04-19 LAB — BASIC METABOLIC PANEL
BUN: 14 mg/dL (ref 6–23)
Calcium: 8.8 mg/dL (ref 8.4–10.5)
Chloride: 98 mEq/L (ref 96–112)
Creatinine, Ser: 0.87 mg/dL (ref 0.50–1.35)
GFR calc Af Amer: >90 mL/min (ref >90)
GFR calc non Af Amer: 83 mL/min — ABNORMAL LOW (ref >90)

## 2013-04-19 LAB — CBC
HCT: 34.7 % — ABNORMAL LOW (ref 39.0–52.0)
MCH: 28.9 pg (ref 26.0–34.0)
MCHC: 33.7 g/dL (ref 30.0–36.0)
MCV: 85.7 fL (ref 78.0–100.0)
Platelets: 146 10*3/uL — ABNORMAL LOW (ref 150–400)
RDW: 13.6 % (ref 11.5–15.5)

## 2013-04-19 MED ORDER — SENNOSIDES-DOCUSATE SODIUM 8.6-50 MG PO TABS
2.0000 | ORAL_TABLET | Freq: Once | ORAL | Status: AC
  Administered 2013-04-19: 2 via ORAL
  Filled 2013-04-19: qty 2

## 2013-04-19 MED ORDER — WARFARIN SODIUM 10 MG PO TABS
10.0000 mg | ORAL_TABLET | Freq: Once | ORAL | Status: AC
  Administered 2013-04-19: 10 mg via ORAL
  Filled 2013-04-19: qty 1

## 2013-04-19 NOTE — Progress Notes (Signed)
Orthopedic Tech Progress Note Patient Details:  Brian Ellison 05-16-1941 [id number]  CPM Right Knee CPM Right Knee: On Right Knee Flexion (Degrees): 65 Right Knee Extension (Degrees): 0   [name], [name] 04/19/2013, 3:18 PM

## 2013-04-19 NOTE — Progress Notes (Signed)
ANTICOAGULATION CONSULT NOTE - Initial Consult  Pharmacy Consult for warfarin Indication: VTE prophylaxis  No Known Allergies  Patient Measurements: Height: 6' (182.9 cm) Weight: 255 lb 11.7 oz (116 kg) IBW/kg (Calculated) : 77.6   Vital Signs: Temp: 98.6 F (37 C) (08/10 0500) Temp src: Oral (08/10 0500) BP: 128/67 mmHg (08/10 0500) Pulse Rate: 81 (08/10 0500)  Labs:  Recent Labs  04/17/13 1621 04/18/13 0555 04/19/13 0630  HGB 13.0 12.5* 11.7*  HCT 39.6 38.9* 34.7*  PLT 170 171 146*  LABPROT  --  14.4 15.3*  INR  --  1.14 1.24  CREATININE 0.76 0.73 0.87    Estimated Creatinine Clearance: 98 ml/min (by C-G formula based on Cr of 0.87).   Medical History: Past Medical History  Diagnosis Date  . Dyslipidemia     per pt questionable  . Allergic rhinitis     uses Nasal Spray prn and takes Allegra daily  . GERD (gastroesophageal reflux disease)     takes Omeprazole daily  . Sleep apnea     uses CPAP  . Pneumonia     hx of;last time 75yrs ago  . Tingling     right arm;takes Gabapentin  . Arthritis   . Joint pain   . Joint swelling   . Back pain     DDD  . Parkinson's disease   . History of colon polyps   . Diverticulosis   . History of kidney stones     many yrs ago  . Glaucoma   . History of shingles     Assessment: 49 yom s/p right total knee arthroplasty.  Warfarin therapy for vte prophylaxis with lovenox bridge post-op. Baseline INR was 0.93, and INR this AM is 1.24 which is still subtherapeutic. Pt's h/h is low but stable and plt is 146.  Patient was not on anticoagulation prior to admission. Will increase warfarin dose today, and no signs of bleeds noted in chart by nursing.  Goal of Therapy:  INR 2-3 Monitor platelets by anticoagulation protocol: Yes   Plan:  -Increase to Warfarin 10 mg tonight at 1800 -Daily INR -monitor plt, h/h, signs bleeds   04/19/2013 8:44 AM  Anabel Bene, PharmD Clinical Pharmacist Pager: [phone]

## 2013-04-19 NOTE — Progress Notes (Signed)
Physical Therapy Treatment Patient Details Name: Brian Ellison MRN: [id number] DOB: 15-Jun-1941 Today's Date: 04/19/2013 Time: 3086-5784 PT Time Calculation (min): 26 min  PT Assessment / Plan / Recommendation  History of Present Illness Patient is a 72 yo male s/p Rt. TKA   PT Comments   Patient continues to be limited by pain in Rt knee and pain in bil. Shoulders when using RW.  Progress is slow.  Continue to recommend SNF at discharge.  Follow Up Recommendations  SNF     Does the patient have the potential to tolerate intense rehabilitation     Barriers to Discharge        Equipment Recommendations  None recommended by PT    Recommendations for Other Services    Frequency 7X/week   Progress towards PT Goals Progress towards PT goals: Progressing toward goals (Slowly - limited by shoulder and knee pain)  Plan Current plan remains appropriate    Precautions / Restrictions Precautions Precautions: Fall;Knee Required Braces or Orthoses: Knee Immobilizer - Right Knee Immobilizer - Right: On except when in CPM Restrictions Weight Bearing Restrictions: Yes RLE Weight Bearing: Weight bearing as tolerated   Pertinent Vitals/Pain Pain 7/10 in Rt knee and Bil shoulders    Mobility  Bed Mobility Bed Mobility: Not assessed Transfers Transfers: Sit to Stand;Stand to Sit Sit to Stand: 1: +2 Total assist;With upper extremity assist;With armrests;From chair/3-in-1 Sit to Stand: Patient Percentage: 50% Stand to Sit: 1: +2 Total assist;With upper extremity assist;With armrests;To chair/3-in-1 Stand to Sit: Patient Percentage: 50% Details for Transfer Assistance: Verbal cues for hand placement.  Patient required +2 assist to rise to standing.  Remains in flexed position at hips and knees.  Assist to control descent to chair.  Performed x2. Ambulation/Gait Ambulation/Gait Assistance: 1: +2 Total assist Ambulation/Gait: Patient Percentage: 60% Ambulation Distance (Feet): 5 Feet (with  one sitting rest break) Assistive device: Rolling walker Ambulation/Gait Assistance Details: Verbal cues for gait sequence and safe use of RW.  Patient able to take 2 steps, became dizzy and returned to sitting.  After rest, able to again stand and take 2 more steps for total of 5'.  Required assist to support weight when standing on RLE to step with LLE. Gait Pattern: Step-to pattern;Decreased stance time - right;Decreased step length - left;Shuffle;Right flexed knee in stance;Left flexed knee in stance;Trunk flexed    Exercises Total Joint Exercises Ankle Circles/Pumps: AROM;Both;10 reps;Seated Quad Sets: AROM;Right;5 reps;Seated Heel Slides: AAROM;Right;5 reps;Seated     PT Goals (current goals can now be found in the care plan section)    Visit Information  Last PT Received On: 04/19/13 Assistance Needed: +2 History of Present Illness: Patient is a 72 yo male s/p Rt. TKA    Subjective Data  Subjective: Patient reports bil shoulder pain when trying to use RW.  Reports chronic shoulder problems.   Cognition  Cognition Arousal/Alertness: Awake/alert Behavior During Therapy: WFL for tasks assessed/performed Overall Cognitive Status: Within Functional Limits for tasks assessed    Balance     End of Session PT - End of Session Equipment Utilized During Treatment: Gait belt;Right knee immobilizer Activity Tolerance: Patient limited by pain;Patient limited by fatigue Patient left: in chair;with call bell/phone within reach;with family/visitor present Nurse Communication: Mobility status;Patient requests pain meds   GP     Vena Austria 04/19/2013, 11:46 AM Durenda Hurt. Renaldo Fiddler, [facility] Acute Rehab Services Pager [phone]

## 2013-04-19 NOTE — Progress Notes (Signed)
    Subjective: 2 Days Post-Op Procedure(s) (LRB): RIGHT TOTAL KNEE ARTHROPLASTY (Right) Patient reports pain as 7 on 0-10 scale.   Denies CP or SOB.  Voiding without difficulty. Positive flatus. Objective: Vital signs in last 24 hours: Temp:  [98.6 F (37 C)-100.1 F (37.8 C)] 98.6 F (37 C) (08/10 0500) Pulse Rate:  [80-82] 81 (08/10 0500) Resp:  [17-20] 20 (08/10 0500) BP: (102-128)/(61-67) 128/67 mmHg (08/10 0500) SpO2:  [95 %] 95 % (08/10 0500)  Intake/Output from previous day: 08/09 0701 - 08/10 0700 In: 480 [P.O.:480] Out: 1200 [Urine:1200] Intake/Output this shift:    Labs:  Recent Labs  04/17/13 1621 04/18/13 0555 04/19/13 0630  HGB 13.0 12.5* 11.7*    Recent Labs  04/18/13 0555 04/19/13 0630  WBC 13.1* 11.9*  RBC 4.45 4.05*  HCT 38.9* 34.7*  PLT 171 146*    Recent Labs  04/17/13 1621 04/18/13 0555  NA  --  133*  K  --  3.8  CL  --  100  CO2  --  23  BUN  --  10  CREATININE 0.76 0.73  GLUCOSE  --  174*  CALCIUM  --  8.5    Recent Labs  04/18/13 0555 04/19/13 0630  INR 1.14 1.24    Physical Exam: Neurologically intact ABD soft Intact pulses distally Incision: dressing C/D/I and no drainage Compartment soft  Assessment/Plan: 2 Days Post-Op Procedure(s) (LRB): RIGHT TOTAL KNEE ARTHROPLASTY (Right) Advance diet Up with therapy HCT today - 34.1 (dec from 38.9)  - will follow clinically    Itzabella Sorrels D for Dr. Venita Lick [facility] [phone] 04/19/2013, 7:59 AM

## 2013-04-19 NOTE — Progress Notes (Signed)
Clinical Social Work Department BRIEF PSYCHOSOCIAL ASSESSMENT 04/19/2013  Patient:  Brian Ellison, Brian Ellison     Account Number:  [id number]     Admit date:  04/17/2013  Clinical Social Worker:  Lourdes Sledge  Date/Time:  04/19/2013 09:10 AM  Referred by:  Physician  Date Referred:  04/19/2013 Referred for  SNF Placement   Other Referral:   Interview type:  Patient Other interview type:   CSW left a message for pt spouse.    PSYCHOSOCIAL DATA Living Status:  WIFE Admitted from facility:   Level of care:   Primary support name:  Josph [place]et [phone] Primary support relationship to patient:  SPOUSE Degree of support available:   Pt states he lives with his supportive spouse.    CURRENT CONCERNS Current Concerns  Post-Acute Placement   Other Concerns:    SOCIAL WORK ASSESSMENT / PLAN CSW received a referral for SNF placement.    CSW visited pt room and introduced herself and role. CSW informed pt of PT recommendations for SNF placement. Pt states he is agreeable to "whatever is recommended." Pt requested for CSW to speak to wife explain SNF placement. Pt has consented for a SNF search in [facility] and did not specify any particular facility. CSW has left a message for pt spouse.    CSW to do a SNF search.   Assessment/plan status:  Psychosocial Support/Ongoing Assessment of Needs Other assessment/ plan:   Information/referral to community resources:   CSW provided pt with a SNF list.    PATIENT'S/FAMILY'S RESPONSE TO PLAN OF CARE: Pt laying on the recliner watching the news. Pt presents alert and oriented and states he is agreeable t ST SNF placement.    CSW to follow up with bed offers.       Theresia Bough, MSW, Theresia Majors [phone]

## 2013-04-19 NOTE — Progress Notes (Addendum)
Clinical Social Work Department CLINICAL SOCIAL WORK PLACEMENT NOTE 04/19/2013  Patient:  Brian Ellison, Brian Ellison  Account Number:  [id number] Admit date:  04/17/2013  Clinical Social Worker:  Theresia Bough, Theresia Majors  Date/time:  04/19/2013 09:15 AM  Clinical Social Work is seeking post-discharge placement for this patient at the following level of care:   SKILLED NURSING   (*CSW will update this form in Epic as items are completed)   04/19/2013  Patient/family provided with [facility] Department of Clinical Social Work's list of facilities offering this level of care within the geographic area requested by the patient (or if unable, by the patient's family).  04/19/2013  Patient/family informed of their freedom to choose among providers that offer the needed level of care, that participate in Medicare, Medicaid or managed care program needed by the patient, have an available bed and are willing to accept the patient.  04/19/2013  Patient/family informed of [facility]' ownership interest in [facility], as well as of the fact that they are under no obligation to receive care at this facility.  PASARR submitted to EDS on 04/21/2013 PASARR number received from EDS on 04/21/2013  FL2 transmitted to all facilities in geographic area requested by pt/family on  04/19/2013 FL2 transmitted to all facilities within larger geographic area on   Patient informed that his/her managed care company has contracts with or will negotiate with  certain facilities, including the following:     Patient/family informed of bed offers received:   Patient chooses bed at [facility] Physician recommends and patient chooses bed at    Patient to be transferred to  on  04/21/2013 Patient to be transferred to facility by [facility]  The following physician request were entered in Epic:   Additional Comments:  Theresia Bough, MSW, [employer] [phone]

## 2013-04-20 ENCOUNTER — Encounter (HOSPITAL_COMMUNITY): Payer: Self-pay | Admitting: Orthopedic Surgery

## 2013-04-20 LAB — CBC
MCH: 28.9 pg (ref 26.0–34.0)
MCHC: 34 g/dL (ref 30.0–36.0)
MCV: 84.8 fL (ref 78.0–100.0)
Platelets: 183 10*3/uL (ref 150–400)
RBC: 4.09 MIL/uL — ABNORMAL LOW (ref 4.22–5.81)

## 2013-04-20 MED ORDER — OXYCODONE HCL 5 MG PO TABS: 5.0000 mg | ORAL_TABLET | ORAL | Status: DC | PRN

## 2013-04-20 MED ORDER — METHOCARBAMOL 500 MG PO TABS: 500.0000 mg | ORAL_TABLET | Freq: Four times a day (QID) | ORAL | Status: DC | PRN

## 2013-04-20 MED ORDER — WARFARIN SODIUM 10 MG PO TABS
10.0000 mg | ORAL_TABLET | Freq: Once | ORAL | Status: AC
  Administered 2013-04-20: 10 mg via ORAL
  Filled 2013-04-20: qty 1

## 2013-04-20 NOTE — Progress Notes (Signed)
Placed patient on CPAP at 11cm with oxygen level set at 2lpm. Sp02 at this time is 92%

## 2013-04-20 NOTE — Progress Notes (Signed)
Patient has a bed at [facility] 04/21/13. Sw will continue to follow and assist with all d/c needs.  Sabino Niemann, MSW [phone]

## 2013-04-20 NOTE — Progress Notes (Signed)
ANTICOAGULATION CONSULT NOTE - Follow Up Consult  Pharmacy Consult for warfarin Indication: VTE prophylaxis  No Known Allergies  Patient Measurements: Height: 6' (182.9 cm) Weight: 255 lb 11.7 oz (116 kg) IBW/kg (Calculated) : 77.6   Vital Signs: Temp: 98.9 F (37.2 C) (08/11 0550) Temp src: Oral (08/11 0550) BP: 131/77 mmHg (08/11 0550) Pulse Rate: 101 (08/11 0550)  Labs:  Recent Labs  04/17/13 1621 04/18/13 0555 04/19/13 0630 04/20/13 0500  HGB 13.0 12.5* 11.7* 11.8*  HCT 39.6 38.9* 34.7* 34.7*  PLT 170 171 146* 183  LABPROT  --  14.4 15.3* 16.7*  INR  --  1.14 1.24 1.39  CREATININE 0.76 0.73 0.87  --     Estimated Creatinine Clearance: 98 ml/min (by C-G formula based on Cr of 0.87).   Medications:  Scheduled:  . aspirin  81 mg Oral Daily  . cholecalciferol  2,000 Units Oral Daily  . enoxaparin (LOVENOX) injection  30 mg Subcutaneous Q12H  . fluticasone  2 spray Each Nare Daily  . gabapentin  600 mg Oral BID  . loratadine  10 mg Oral Daily  . multivitamin with minerals  1 tablet Oral Daily  . omega-3 acid ethyl esters  1 g Oral BID  . pantoprazole  40 mg Oral Daily  . patient's guide to using coumadin book   Does not apply Once  . rOPINIRole  1 mg Oral QHS  . warfarin   Does not apply Once  . Warfarin - Pharmacist Dosing Inpatient   Does not apply q1800    Assessment: 21 YOM s/p R total knee arthroplasty. On Lovenox 30mg  subq BID as well as warfarin for VTE prophylaxis. INR this morning is 1.39 which is increasing, but still subtherapeutic. No signs of bleeding. Plts WNL and H/H is low but stable (expected after surgery)   Goal of Therapy:  INR 2-3 Monitor platelets by anticoagulation protocol: Yes   Plan:  1. Warfarin 10mg  po x1 tonight 2. Note plans for likely discharge today- would recommend continuing warfarin 10mg  daily with an INR check 3-4 days after discharge to adjust dosing if needed 3. Daily PT/INR if patient is not discharged 4. Follow  up for any signs or symptoms of bleeding  Syna Gad D. Roth Ress, PharmD Clinical Pharmacist Pager: [phone] 04/20/2013 11:32 AM

## 2013-04-20 NOTE — Discharge Summary (Signed)
Physician Discharge Summary   Patient ID: Brian Ellison MRN: [id number] DOB/AGE: 1941-07-01 72 y.o.  Admit date: 04/17/2013 Discharge date: 04/20/2013  Admission Diagnoses:  Right knee end stage osteoarthritis  Discharge Diagnoses:  Same   Surgeries: Procedure(s): RIGHT TOTAL KNEE ARTHROPLASTY on 04/17/2013   Consultants: PT/OT  Discharged Condition: Stable  Hospital Course: Brian Ellison is an 72 y.o. male who was admitted 04/17/2013 with a chief complaint of No chief complaint on file. , and found to have a diagnosis of <principal problem not specified>.  They were brought to the operating room on 04/17/2013 and underwent the above named procedures.    The patient had an uncomplicated hospital course and was stable for discharge.  Recent vital signs:  Filed Vitals:   04/20/13 0550  BP: 131/77  Pulse: 101  Temp: 98.9 F (37.2 C)  Resp: 16    Recent laboratory studies:  Results for orders placed during the hospital encounter of 04/17/13  CBC      Result Value Range   WBC 13.0 (*) 4.0 - 10.5 K/uL   RBC 4.50  4.22 - 5.81 MIL/uL   Hemoglobin 13.0  13.0 - 17.0 g/dL   HCT 40.9  81.1 - 91.4 %   MCV 88.0  78.0 - 100.0 fL   MCH 28.9  26.0 - 34.0 pg   MCHC 32.8  30.0 - 36.0 g/dL   RDW 78.2  95.6 - 21.3 %   Platelets 170  150 - 400 K/uL  CREATININE, SERUM      Result Value Range   Creatinine, Ser 0.76  0.50 - 1.35 mg/dL   GFR calc non Af Amer 88 (*) >90 mL/min   GFR calc Af Amer >90  >90 mL/min  PROTIME-INR      Result Value Range   Prothrombin Time 14.4  11.6 - 15.2 seconds   INR 1.14  0.00 - 1.49  CBC      Result Value Range   WBC 13.1 (*) 4.0 - 10.5 K/uL   RBC 4.45  4.22 - 5.81 MIL/uL   Hemoglobin 12.5 (*) 13.0 - 17.0 g/dL   HCT 08.6 (*) 57.8 - 46.9 %   MCV 87.4  78.0 - 100.0 fL   MCH 28.1  26.0 - 34.0 pg   MCHC 32.1  30.0 - 36.0 g/dL   RDW 62.9  52.8 - 41.3 %   Platelets 171  150 - 400 K/uL  BASIC METABOLIC PANEL      Result Value Range   Sodium 133 (*) 135  - 145 mEq/L   Potassium 3.8  3.5 - 5.1 mEq/L   Chloride 100  96 - 112 mEq/L   CO2 23  19 - 32 mEq/L   Glucose, Bld 174 (*) 70 - 99 mg/dL   BUN 10  6 - 23 mg/dL   Creatinine, Ser 2.44  0.50 - 1.35 mg/dL   Calcium 8.5  8.4 - 01.0 mg/dL   GFR calc non Af Amer 89 (*) >90 mL/min   GFR calc Af Amer >90  >90 mL/min  PROTIME-INR      Result Value Range   Prothrombin Time 15.3 (*) 11.6 - 15.2 seconds   INR 1.24  0.00 - 1.49  CBC      Result Value Range   WBC 11.9 (*) 4.0 - 10.5 K/uL   RBC 4.05 (*) 4.22 - 5.81 MIL/uL   Hemoglobin 11.7 (*) 13.0 - 17.0 g/dL   HCT 27.2 (*) 53.6 - 64.4 %  MCV 85.7  78.0 - 100.0 fL   MCH 28.9  26.0 - 34.0 pg   MCHC 33.7  30.0 - 36.0 g/dL   RDW 16.1  09.6 - 04.5 %   Platelets 146 (*) 150 - 400 K/uL  BASIC METABOLIC PANEL      Result Value Range   Sodium 132 (*) 135 - 145 mEq/L   Potassium 4.0  3.5 - 5.1 mEq/L   Chloride 98  96 - 112 mEq/L   CO2 25  19 - 32 mEq/L   Glucose, Bld 159 (*) 70 - 99 mg/dL   BUN 14  6 - 23 mg/dL   Creatinine, Ser 4.09  0.50 - 1.35 mg/dL   Calcium 8.8  8.4 - 81.1 mg/dL   GFR calc non Af Amer 83 (*) >90 mL/min   GFR calc Af Amer >90  >90 mL/min  PROTIME-INR      Result Value Range   Prothrombin Time 16.7 (*) 11.6 - 15.2 seconds   INR 1.39  0.00 - 1.49  CBC      Result Value Range   WBC 12.4 (*) 4.0 - 10.5 K/uL   RBC 4.09 (*) 4.22 - 5.81 MIL/uL   Hemoglobin 11.8 (*) 13.0 - 17.0 g/dL   HCT 91.4 (*) 78.2 - 95.6 %   MCV 84.8  78.0 - 100.0 fL   MCH 28.9  26.0 - 34.0 pg   MCHC 34.0  30.0 - 36.0 g/dL   RDW 21.3  08.6 - 57.8 %   Platelets 183  150 - 400 K/uL    Discharge Medications:     Medication List    ASK your doctor about these medications       aspirin 81 MG tablet  Take 81 mg by mouth daily.     fexofenadine 180 MG tablet  Commonly known as:  ALLEGRA  Take 180 mg by mouth daily.     Fish Oil 1200 MG Caps  Take 1 capsule by mouth daily.     flunisolide 29 MCG/ACT (0.025%) nasal spray  Commonly known as:   NASAREL  Place 2 sprays into the nose as needed. Dose is for each nostril.     gabapentin 300 MG capsule  Commonly known as:  NEURONTIN  Take 600 mg by mouth 2 (two) times daily.     multivitamin capsule  Take 1 capsule by mouth daily.     omeprazole 20 MG capsule  Commonly known as:  PRILOSEC  Take 20 mg by mouth 2 (two) times daily.     rOPINIRole 1 MG tablet  Commonly known as:  REQUIP  Take 1 mg by mouth at bedtime.     traMADol 50 MG tablet  Commonly known as:  ULTRAM  Take 50 mg by mouth every 6 (six) hours as needed for pain.     Vitamin D3 2000 UNITS Tabs  Take 1 tablet by mouth daily.        Diagnostic Studies: Dg Knee Right Port  04/17/2013   *RADIOLOGY REPORT*  Clinical Data: Postop total knee replacement  PORTABLE RIGHT KNEE - 1-2 VIEW  Comparison: 11/11/2006  Findings: Two-view portable study at 1144 hours shows the patient be status post tricompartmental knee replacement.  No evidence for immediate hardware complications.  Gas in the soft tissues is compatible with immediate postoperative state.  IMPRESSION: Status post knee replacement without evidence for immediate complicating features.   Original Report Authenticated By: Kennith Center, M.D.    Disposition:  Future Appointments Provider Department Dept Phone   09/23/2013 10:00 AM Barbaraann Share, MD [facility] [phone]         Signed: Standley Dakins B 04/20/2013, 10:32 AM

## 2013-04-20 NOTE — Progress Notes (Signed)
   Subjective: 3 Days Post-Op Procedure(s) (LRB): RIGHT TOTAL KNEE ARTHROPLASTY (Right)  Pain is better today but pain medication makes him sleepy Patient reports pain as mild.  Objective:   VITALS:   Filed Vitals:   04/20/13 0550  BP: 131/77  Pulse: 101  Temp: 98.9 F (37.2 C)  Resp: 16    Right knee incision healing well nv intact distally  No rashes or edema  LABS  Recent Labs  04/18/13 0555 04/19/13 0630 04/20/13 0500  HGB 12.5* 11.7* 11.8*  HCT 38.9* 34.7* 34.7*  WBC 13.1* 11.9* 12.4*  PLT 171 146* 183     Recent Labs  04/17/13 1621 04/18/13 0555 04/19/13 0630  NA  --  133* 132*  K  --  3.8 4.0  BUN  --  10 14  CREATININE 0.76 0.73 0.87  GLUCOSE  --  174* 159*     Assessment/Plan: 3 Days Post-Op Procedure(s) (LRB): RIGHT TOTAL KNEE ARTHROPLASTY (Right)  Pain much better today May d/c to snf once bed available F/u in 2 weeks with Dr. Beryle Lathe, MPAS, PA-C  04/20/2013, 10:30 AM

## 2013-04-20 NOTE — Progress Notes (Signed)
Physical Therapy Treatment Patient Details Name: Brian Ellison MRN: [id number] DOB: [birth year] Today's Date: 04/20/2013 Time: 7829-5621 PT Time Calculation (min): 25 min  PT Assessment / Plan / Recommendation  History of Present Illness Patient is a 72 yo male s/p Rt. TKA   PT Comments   Patient progressing slowly this session but making improvement. Planning to DC to SNF today if bed available. Continue current POC  Follow Up Recommendations  SNF     Does the patient have the potential to tolerate intense rehabilitation     Barriers to Discharge        Equipment Recommendations  None recommended by PT    Recommendations for Other Services    Frequency 7X/week   Progress towards PT Goals Progress towards PT goals: Progressing toward goals  Plan Current plan remains appropriate    Precautions / Restrictions Precautions Precautions: Fall;Knee Precaution Comments: Reviewed precautions with patient Required Braces or Orthoses: Knee Immobilizer - Right Knee Immobilizer - Right: On except when in CPM Restrictions Weight Bearing Restrictions: Yes RLE Weight Bearing: Weight bearing as tolerated   Pertinent Vitals/Pain 8/10 knee pain   Mobility  Bed Mobility Supine to Sit: With rails;HOB elevated;3: Mod assist Sitting - Scoot to Edge of Bed: 3: Mod assist Details for Bed Mobility Assistance: Verbal cues for technique.  Assist to control trunk and to raise LE's onto bed. Transfers Sit to Stand: 1: +2 Total assist;With upper extremity assist;With armrests;From chair/3-in-1 Sit to Stand: Patient Percentage: 60% Stand to Sit: 1: +2 Total assist;With upper extremity assist;With armrests;To chair/3-in-1 Stand to Sit: Patient Percentage: 60% Ambulation/Gait Ambulation/Gait Assistance: 3: Mod assist Ambulation Distance (Feet): 10 Feet Assistive device: Rolling walker Ambulation/Gait Assistance Details: Cues for upright posture and very forward flexed posture over RW. Patient UEs  fatique quickly with ambulation. Patient did not complain of dizziness this session  Gait Pattern: Step-to pattern;Decreased stance time - right;Decreased step length - left;Shuffle;Right flexed knee in stance;Left flexed knee in stance;Trunk flexed Gait velocity: decreased    Exercises Total Joint Exercises Ankle Circles/Pumps: AROM;Both;10 reps;Seated Quad Sets: AROM;Right;10 reps Heel Slides: AAROM;Right;10 reps Hip ABduction/ADduction: AAROM;Right;10 reps   PT Diagnosis:    PT Problem List:   PT Treatment Interventions:     PT Goals (current goals can now be found in the care plan section)    Visit Information  Last PT Received On: 04/20/13 Assistance Needed: +2 History of Present Illness: Patient is a 72 yo male s/p Rt. TKA    Subjective Data      Cognition  Cognition Arousal/Alertness: Awake/alert Behavior During Therapy: WFL for tasks assessed/performed Overall Cognitive Status: Within Functional Limits for tasks assessed    Balance     End of Session PT - End of Session Equipment Utilized During Treatment: Gait belt;Right knee immobilizer Activity Tolerance: Patient limited by pain;Patient limited by fatigue Patient left: in chair;with call bell/phone within reach;with family/visitor present Nurse Communication: Mobility status;Patient requests pain meds CPM Right Knee CPM Right Knee: On Right Knee Flexion (Degrees): 65 Right Knee Extension (Degrees): 0   GP     Robinette, Adline Potter 04/20/2013, 11:50 AM 04/20/2013 Fredrich Birks PTA [phone] pager [phone] office

## 2013-04-21 LAB — PROTIME-INR: Prothrombin Time: 17.5 seconds — ABNORMAL HIGH (ref 11.6–15.2)

## 2013-04-21 MED ORDER — WARFARIN SODIUM 2.5 MG PO TABS
12.5000 mg | ORAL_TABLET | Freq: Once | ORAL | Status: DC
  Filled 2013-04-21: qty 1

## 2013-04-21 NOTE — Progress Notes (Signed)
   Subjective: 4 Days Post-Op Procedure(s) (LRB): RIGHT TOTAL KNEE ARTHROPLASTY (Right)  Pt feeling better today Ready for d/c Patient reports pain as mild.  Objective:   VITALS:   Filed Vitals:   04/21/13 0520  BP: 154/66  Pulse: 77  Temp: 97.8 F (36.6 C)  Resp: 18    Right knee incision healing well nv intact distally No rashes or edema  LABS  Recent Labs  04/19/13 0630 04/20/13 0500  HGB 11.7* 11.8*  HCT 34.7* 34.7*  WBC 11.9* 12.4*  PLT 146* 183     Recent Labs  04/19/13 0630  NA 132*  K 4.0  BUN 14  CREATININE 0.87  GLUCOSE 159*     Assessment/Plan: 4 Days Post-Op Procedure(s) (LRB): RIGHT TOTAL KNEE ARTHROPLASTY (Right)   Pt doing well D/c to SNF today F/u in 2 weeks   Alphonsa Overall, MPAS, PA-C  04/21/2013, 1:26 PM

## 2013-04-21 NOTE — Progress Notes (Addendum)
ANTICOAGULATION CONSULT NOTE - Follow Up Consult  Pharmacy Consult for warfarin Indication: VTE prophylaxis  No Known Allergies  Patient Measurements: Height: 6' (182.9 cm) Weight: 255 lb 11.7 oz (116 kg) IBW/kg (Calculated) : 77.6   Vital Signs: Temp: 97.8 F (36.6 C) (08/12 0520) Temp src: Oral (08/12 0520) BP: 154/66 mmHg (08/12 0520) Pulse Rate: 77 (08/12 0520)  Labs:  Recent Labs  04/19/13 0630 04/20/13 0500 04/21/13 0610  HGB 11.7* 11.8*  --   HCT 34.7* 34.7*  --   PLT 146* 183  --   LABPROT 15.3* 16.7* 17.5*  INR 1.24 1.39 1.48  CREATININE 0.87  --   --     Estimated Creatinine Clearance: 98 ml/min (by C-G formula based on Cr of 0.87).   Medications:  Scheduled:  . aspirin  81 mg Oral Daily  . cholecalciferol  2,000 Units Oral Daily  . enoxaparin (LOVENOX) injection  30 mg Subcutaneous Q12H  . fluticasone  2 spray Each Nare Daily  . gabapentin  600 mg Oral BID  . loratadine  10 mg Oral Daily  . multivitamin with minerals  1 tablet Oral Daily  . omega-3 acid ethyl esters  1 g Oral BID  . pantoprazole  40 mg Oral Daily  . patient's guide to using coumadin book   Does not apply Once  . rOPINIRole  1 mg Oral QHS  . warfarin   Does not apply Once  . Warfarin - Pharmacist Dosing Inpatient   Does not apply q1800    Assessment: 7 YOM s/p R total knee arthroplasty. On Lovenox 30mg  subq BID as well as warfarin for VTE prophylaxis. INR this morning is 1.48 which is increasing nicely, however is still subtherapeutic. No signs of bleeding. Plts WNL and H/H is low but stable. Noted patient has a SNF bed available for today.   Goal of Therapy:  INR 2-3 Monitor platelets by anticoagulation protocol: Yes   Plan:  1. Warfarin 12.5mg  po x1 tonight 2. Note plans for likely discharge to SNF today- would recommend continuing warfarin 10mg  daily with an INR check 3-4 days after discharge to adjust dosing if needed 3. Patient will need to continue Lovenox bridge until  INR is therapeutic. Lovenox 30mg  subq Q12 hr is an appropriate dose for patient's renal function 4. Daily PT/INR if patient is not discharged 5. Follow up for any signs or symptoms of bleeding  Sabrin Dunlevy D. Yannis Gumbs, PharmD Clinical Pharmacist Pager: [phone] 04/21/2013 11:03 AM

## 2013-04-21 NOTE — Progress Notes (Signed)
Clinical social worker assisted with patient discharge to skilled nursing facility, [facility].  CSW addressed all family questions and concerns. CSW copied chart and added all important documents. CSW also set up patient transportation with [facility]. Clinical Social Worker will sign off for now as social work intervention is no longer needed.  Brian Ellison, MSW, [phone] 

## 2013-04-21 NOTE — Progress Notes (Signed)
Physical Therapy Treatment Patient Details Name: Brian Ellison MRN: [id number] DOB: 06-05-1941 Today's Date: 04/21/2013 Time: 9147-8295 PT Time Calculation (min): 24 min  PT Assessment / Plan / Recommendation  History of Present Illness Patient is a 72 yo male s/p Rt. TKA   PT Comments   Patient progressing with ambulation. Awaiting SNF placement later today  Follow Up Recommendations  SNF     Does the patient have the potential to tolerate intense rehabilitation     Barriers to Discharge        Equipment Recommendations  None recommended by PT    Recommendations for Other Services    Frequency 7X/week   Progress towards PT Goals Progress towards PT goals: Progressing toward goals  Plan Current plan remains appropriate    Precautions / Restrictions Precautions Precautions: Fall;Knee Precaution Comments: Reviewed precautions with patient Required Braces or Orthoses: Knee Immobilizer - Right Knee Immobilizer - Right: On except when in CPM Restrictions RLE Weight Bearing: Weight bearing as tolerated   Pertinent Vitals/Pain 7/10 knee pain. Patient premedicated   Mobility  Bed Mobility Supine to Sit: 4: Min assist;With rails;HOB elevated Sitting - Scoot to Edge of Bed: 4: Min assist Details for Bed Mobility Assistance: Verbal cues for technique.  Assist to control trunk  Transfers Sit to Stand: 1: +2 Total assist;With upper extremity assist;With armrests;From chair/3-in-1 Sit to Stand: Patient Percentage: 60% Stand to Sit: 4: Min assist;To chair/3-in-1;With upper extremity assist Details for Transfer Assistance: Patient continues to need +2 assist for stand as he leans extremely forward over RW and does not postioning feet well at all causing decreased balance and icnreased assitance. Cues for technique and to control sit into recliner Ambulation/Gait Ambulation/Gait Assistance: 3: Mod assist Assistive device: Rolling walker Ambulation/Gait Assistance Details: A for BLE  advance intitially and Cues to increase step length Gait Pattern: Step-to pattern;Decreased stance time - right;Decreased step length - left;Shuffle;Right flexed knee in stance;Left flexed knee in stance;Trunk flexed Gait velocity: decreased    Exercises Total Joint Exercises Quad Sets: AROM;Right;10 reps Heel Slides: AAROM;Right;10 reps Hip ABduction/ADduction: AAROM;Right;10 reps Long Arc Quad: AAROM;Right;5 reps;Seated   PT Diagnosis:    PT Problem List:   PT Treatment Interventions:     PT Goals (current goals can now be found in the care plan section)    Visit Information  Last PT Received On: 04/21/13 Assistance Needed: +2 History of Present Illness: Patient is a 72 yo male s/p Rt. TKA    Subjective Data      Cognition  Cognition Arousal/Alertness: Awake/alert Behavior During Therapy: WFL for tasks assessed/performed Overall Cognitive Status: Within Functional Limits for tasks assessed    Balance     End of Session PT - End of Session Equipment Utilized During Treatment: Gait belt;Right knee immobilizer Activity Tolerance: Patient limited by fatigue;Patient tolerated treatment well Patient left: in chair;with call bell/phone within reach;with family/visitor present Nurse Communication: Mobility status;Patient requests pain meds   GP     Fredrich Birks 04/21/2013, 12:05 PM 04/21/2013 Fredrich Birks PTA [phone] pager [phone] office

## 2013-04-22 ENCOUNTER — Non-Acute Institutional Stay (SKILLED_NURSING_FACILITY): Payer: Medicare Other | Admitting: Adult Health

## 2013-04-22 DIAGNOSIS — M792 Neuralgia and neuritis, unspecified: Secondary | ICD-10-CM

## 2013-04-22 DIAGNOSIS — M25569 Pain in unspecified knee: Secondary | ICD-10-CM

## 2013-04-22 DIAGNOSIS — IMO0002 Reserved for concepts with insufficient information to code with codable children: Secondary | ICD-10-CM

## 2013-04-22 DIAGNOSIS — M1711 Unilateral primary osteoarthritis, right knee: Secondary | ICD-10-CM

## 2013-04-22 DIAGNOSIS — E785 Hyperlipidemia, unspecified: Secondary | ICD-10-CM

## 2013-04-22 DIAGNOSIS — M25561 Pain in right knee: Secondary | ICD-10-CM

## 2013-04-22 DIAGNOSIS — K219 Gastro-esophageal reflux disease without esophagitis: Secondary | ICD-10-CM

## 2013-04-22 DIAGNOSIS — G2581 Restless legs syndrome: Secondary | ICD-10-CM

## 2013-04-22 DIAGNOSIS — J309 Allergic rhinitis, unspecified: Secondary | ICD-10-CM

## 2013-04-22 DIAGNOSIS — M1710 Unilateral primary osteoarthritis, unspecified knee: Secondary | ICD-10-CM

## 2013-04-24 ENCOUNTER — Encounter: Payer: Self-pay | Admitting: Adult Health

## 2013-04-24 DIAGNOSIS — M25561 Pain in right knee: Secondary | ICD-10-CM | POA: Insufficient documentation

## 2013-04-24 DIAGNOSIS — M1711 Unilateral primary osteoarthritis, right knee: Secondary | ICD-10-CM | POA: Insufficient documentation

## 2013-04-24 NOTE — Progress Notes (Signed)
Patient ID: Brian NANNA Sr., male   DOB: 05/21/1941, 72 y.o.   MRN: [id number]       PROGRESS NOTE  DATE: 04/22/2013  FACILITY:  [facility]  LEVEL OF CARE: SNF (31)  Acute Visit  CHIEF COMPLAINT:  Follow-up hospitalization  HISTORY OF PRESENT ILLNESS: This is a 72 year old male who was been admitted to [facility] on 04/21/13 from [facility] with osteoarthritis is status post right total knee arthroplasty. He has been admitted for a short term rehabilitation.   PAST MEDICAL HISTORY : Reviewed.  No changes.  CURRENT MEDICATIONS: Reviewed per [facility]  REVIEW OF SYSTEMS:  GENERAL: no change in appetite, no fatigue, no weight changes, no fever, chills or weakness RESPIRATORY: no cough, SOB, DOE,, wheezing, hemoptysis CARDIAC: no chest pain, edema or palpitations GI: no abdominal pain, diarrhea, constipation, heart burn, nausea or vomiting EXTREMITIES: knee pain   PHYSICAL EXAMINATION  VS:  T 97.2        P 74       RR 20       BP 1:30/70      POX 98 %       WT 255 (Lb)  GENERAL: no acute distress EYES: conjunctivae normal, sclerae normal, normal eye lids NECK: supple, trachea midline, no neck masses, no thyroid tenderness, no thyromegaly LYMPHATICS: no LAN in the neck, no supraclavicular LAN RESPIRATORY: breathing is even & unlabored, BS CTAB CARDIAC: RRR, no murmur,no extra heart sounds, no edema GI: abdomen soft, normal BS, no masses, no tenderness, no hepatomegaly, no splenomegaly PSYCHIATRIC: the patient is alert & oriented to person, affect & behavior appropriate  LABS/RADIOLOGY: 04/20/13 WBC 11.9 hemoglobin 11.7 hematocrit 39.0 sodium 132 potassium 4.0 glucose 159 BUN 14 creatinine 0.87 calcium 8.8   ASSESSMENT/PLAN:  Osteoarthritis status post right total knee arthroplasty - for PT and OT  Dyslipidemia - continue fish oil  Allergic rhinitis - stable  Neuralgia - no complaints  GERD - stable  Restless leg syndrome - stable  Right  knee pain - increase Ultram to 50 mg 2 tabs = 100 mg by mouth every 6 a.m. and 10 PM, Ultram 50 mg PO Q 2 PM and Ultram 50 mg 1 tab PO Q 8 hours PRN   CPT CODE: 08657

## 2013-04-28 ENCOUNTER — Non-Acute Institutional Stay (SKILLED_NURSING_FACILITY): Payer: Medicare Other | Admitting: Adult Health

## 2013-04-28 ENCOUNTER — Encounter: Payer: Self-pay | Admitting: Adult Health

## 2013-04-28 DIAGNOSIS — J309 Allergic rhinitis, unspecified: Secondary | ICD-10-CM

## 2013-04-28 DIAGNOSIS — M1710 Unilateral primary osteoarthritis, unspecified knee: Secondary | ICD-10-CM

## 2013-04-28 DIAGNOSIS — M792 Neuralgia and neuritis, unspecified: Secondary | ICD-10-CM | POA: Insufficient documentation

## 2013-04-28 DIAGNOSIS — E785 Hyperlipidemia, unspecified: Secondary | ICD-10-CM

## 2013-04-28 DIAGNOSIS — IMO0002 Reserved for concepts with insufficient information to code with codable children: Secondary | ICD-10-CM

## 2013-04-28 DIAGNOSIS — M1711 Unilateral primary osteoarthritis, right knee: Secondary | ICD-10-CM

## 2013-04-28 DIAGNOSIS — G2581 Restless legs syndrome: Secondary | ICD-10-CM

## 2013-04-28 NOTE — Progress Notes (Signed)
Patient ID: Brian KREJCI Sr., male   DOB: 06/27/1941, 72 y.o.   MRN: [id number]        PROGRESS NOTE  DATE: 04/28/2013   FACILITY: [facility]  LEVEL OF CARE: SNF (31)  Discharge Visit  CHIEF COMPLAINT:  Manage osteoarthritis status post right total knee arthroplasty, dyslipidemia, neuralgia. and restless leg syndrome  HISTORY OF PRESENT ILLNESS: This is a 72 year old male who is for discharge home with outpatient care. He has been admitted to [facility] on 04/21/13 from [facility] with osteoarthritis status post right total knee arthroplasty. Patient has completed SNF rehabilitation and therapy has cleared the patient for discharge.  Reassessment of ongoing problem(s):  DYSLIPIDEMIA: No complications from the medications presently being used.  ALLERGIC RHINITIS: Allergic rhinitis remains stable.  Patient denies ongoing symptoms such as runny nose sneezing or tearing. No complications reported from the current medication(s) being used.  PAST MEDICAL HISTORY : Reviewed.  No changes.  CURRENT MEDICATIONS: Reviewed per [facility]  REVIEW OF SYSTEMS:  GENERAL: no change in appetite, no fatigue, no weight changes, no fever, chills or weakness RESPIRATORY: no cough, SOB, DOE, wheezing, hemoptysis CARDIAC: no chest pain, edema or palpitations GI: no abdominal pain, diarrhea, constipation, heart burn, nausea or vomiting  PHYSICAL EXAMINATION  VS:  T 98       P 72       RR 18      BP 135/72      POX 97 %       WT 255 (Lb)  GENERAL: no acute distress, normal body habitus EYES: conjunctivae normal, sclerae normal, normal eye lids NECK: supple, trachea midline, no neck masses, no thyroid tenderness, no thyromegaly LYMPHATICS: no LAN in the neck, no supraclavicular LAN RESPIRATORY: breathing is even & unlabored, BS CTAB CARDIAC: RRR, no murmur,no extra heart sounds, no edema GI: abdomen soft, normal BS, no masses, no tenderness, no hepatomegaly, no  splenomegaly PSYCHIATRIC: the patient is alert & oriented to person, affect & behavior appropriate  LABS/RADIOLOGY: 04/20/13 WBC 11.9 hemoglobin 11.7 hematocrit 39.0 sodium 132 potassium 4.0 glucose 159 BUN 14 creatinine 0.87 calcium 8  ASSESSMENT/PLAN:  Osteoarthritis status post right total knee arthroplasty - for outpatient care  Dyslipidemia - continue fish oil  Allergic rhinitis - stable  Neuralgia - no complaints  GERD - stable  Restless leg syndrome - stable   I have filled out patient's discharge paperwork and written prescriptions.  Patient will receive outpatient care .    Total discharge time: Less than 30 minutes Discharge time involved coordination of the discharge process with Child psychotherapist, nursing staff and therapy department. Medical justification for home health services/DME verified.   CPT CODE: 40981

## 2013-04-30 ENCOUNTER — Non-Acute Institutional Stay (SKILLED_NURSING_FACILITY): Payer: Medicare Other | Admitting: Internal Medicine

## 2013-04-30 DIAGNOSIS — J309 Allergic rhinitis, unspecified: Secondary | ICD-10-CM

## 2013-04-30 DIAGNOSIS — IMO0002 Reserved for concepts with insufficient information to code with codable children: Secondary | ICD-10-CM

## 2013-04-30 DIAGNOSIS — M1711 Unilateral primary osteoarthritis, right knee: Secondary | ICD-10-CM

## 2013-04-30 DIAGNOSIS — M1710 Unilateral primary osteoarthritis, unspecified knee: Secondary | ICD-10-CM

## 2013-04-30 DIAGNOSIS — K219 Gastro-esophageal reflux disease without esophagitis: Secondary | ICD-10-CM

## 2013-05-01 ENCOUNTER — Encounter: Payer: Self-pay | Admitting: Adult Health

## 2013-05-05 ENCOUNTER — Other Ambulatory Visit: Payer: Self-pay | Admitting: *Deleted

## 2013-05-05 ENCOUNTER — Ambulatory Visit (HOSPITAL_COMMUNITY)
Admission: RE | Admit: 2013-05-05 | Discharge: 2013-05-05 | Disposition: A | Discharge status: Home / Self Care | Payer: Medicare Other | Source: Ambulatory Visit | Attending: Cardiovascular Disease | Admitting: Cardiovascular Disease

## 2013-05-05 DIAGNOSIS — M79604 Pain in right leg: Secondary | ICD-10-CM

## 2013-05-05 DIAGNOSIS — M79609 Pain in unspecified limb: Secondary | ICD-10-CM

## 2013-05-05 DIAGNOSIS — I82403 Acute embolism and thrombosis of unspecified deep veins of lower extremity, bilateral: Secondary | ICD-10-CM

## 2013-05-05 DIAGNOSIS — M7989 Other specified soft tissue disorders: Secondary | ICD-10-CM | POA: Insufficient documentation

## 2013-05-05 DIAGNOSIS — I82409 Acute embolism and thrombosis of unspecified deep veins of unspecified lower extremity: Secondary | ICD-10-CM | POA: Insufficient documentation

## 2013-05-05 NOTE — Progress Notes (Signed)
Venous Duplex Completed. Negative. Brianna L Mazza,RVT

## 2013-05-06 ENCOUNTER — Encounter: Payer: Self-pay | Admitting: Adult Health

## 2013-05-06 ENCOUNTER — Other Ambulatory Visit (HOSPITAL_COMMUNITY): Payer: Self-pay | Admitting: Orthopedic Surgery

## 2013-05-06 DIAGNOSIS — I82403 Acute embolism and thrombosis of unspecified deep veins of lower extremity, bilateral: Secondary | ICD-10-CM

## 2013-05-06 NOTE — Progress Notes (Signed)
This encounter was created in error - please disregard.

## 2013-06-01 DIAGNOSIS — K219 Gastro-esophageal reflux disease without esophagitis: Secondary | ICD-10-CM | POA: Insufficient documentation

## 2013-06-01 NOTE — Progress Notes (Signed)
Patient ID: Brian DOMBEK Sr., male   DOB: [birth year], 72 y.o.   MRN: [id number]        HISTORY & PHYSICAL  DATE: 04/30/2013   FACILITY: [facility]  LEVEL OF CARE: SNF (31)  ALLERGIES:  No Known Allergies  CHIEF COMPLAINT:  Manage right knee osteoarthritis, allergic rhinitis, and GERD.    HISTORY OF PRESENT ILLNESS:  The patient is a 72 year-old, Caucasian male.    KNEE OSTEOARTHRITIS: Patient had a history of pain and functional disability in the knee due to end-stage osteoarthritis and has failed nonsurgical conservative treatments. Patient had worsening of pain with activity and weight bearing, pain that interfered with activities of daily living & pain with passive range of motion. Therefore patient underwent total knee arthroplasty and tolerated the procedure well. Patient is admitted to this facility for sort short-term rehabilitation. Patient denies knee pain.    ALLERGIC RHINITIS: Allergic rhinitis remains stable. Patient denies ongoing symptoms such as runny nose, sneezing or tearing. No complications reported from the current medication(s) being used.    GERD: pt's GERD is stable.  Denies ongoing heartburn, abd. Pain, nausea or vomiting.  Currently on a PPI & tolerates it without any adverse reactions.    PAST MEDICAL HISTORY :  Past Medical History  Diagnosis Date  . Dyslipidemia     per pt questionable  . Allergic rhinitis     uses Nasal Spray prn and takes Allegra daily  . GERD (gastroesophageal reflux disease)     takes Omeprazole daily  . Sleep apnea     uses CPAP  . Pneumonia     hx of;last time 36yrs ago  . Tingling     right arm;takes Gabapentin  . Arthritis   . Joint pain   . Joint swelling   . Back pain     DDD  . Parkinson's disease   . History of colon polyps   . Diverticulosis   . History of kidney stones     many yrs ago  . Glaucoma   . History of shingles     PAST SURGICAL HISTORY: Past Surgical History  Procedure  Laterality Date  . Appendectomy    . Back surgery    . Right shoudler replacement      x 2  . Left shoulder arthroscopy      x 2  . Joint replacement Left   . Eye surgery      cataract surgery bilateral  . Left heel surgery    . Cervical fusion      x 2  . Penile prosthesis implant    . Pain stimulator placed    . Colonoscopy    . Detached retina surgery to right eye    . Total knee arthroplasty Right 04/17/2013    Dr Ranell Patrick  . Total knee arthroplasty Right 04/17/2013    Procedure: RIGHT TOTAL KNEE ARTHROPLASTY;  Surgeon: Verlee Rossetti, MD;  Location: [facility] OR;  Service: Orthopedics;  Laterality: Right;    SOCIAL HISTORY:  reports that he has quit smoking. His smoking use included Cigarettes. He has a 15 pack-year smoking history. He has never used smokeless tobacco. He reports that he does not drink alcohol or use illicit drugs.  FAMILY HISTORY: None  CURRENT MEDICATIONS: Reviewed per MAR  REVIEW OF SYSTEMS:  See HPI otherwise 14 point ROS is negative.  PHYSICAL EXAMINATION  VS:  T 98.9       P 73  RR 18      BP 132/78      POX 99% room air        WT (Lb) 251.40    GENERAL: no acute distress, morbidly obese body habitus EYES: conjunctivae normal, sclerae normal, normal eye lids MOUTH/THROAT: lips without lesions,no lesions in the mouth,tongue is without lesions,uvula elevates in midline NECK: supple, trachea midline, no neck masses, no thyroid tenderness, no thyromegaly LYMPHATICS: no LAN in the neck, no supraclavicular LAN RESPIRATORY: breathing is even & unlabored, BS CTAB CARDIAC: RRR, no murmur,no extra heart sounds, no edema GI:  ABDOMEN: abdomen soft, normal BS, no masses, no tenderness  LIVER/SPLEEN: no hepatomegaly, no splenomegaly HERNIA: umbilical hernia present    MUSCULOSKELETAL: HEAD: normal to inspection & palpation BACK: no kyphosis, scoliosis or spinal processes tenderness EXTREMITIES: LEFT UPPER EXTREMITY: full range of motion, normal strength &  tone RIGHT UPPER EXTREMITY:  full range of motion, normal strength & tone LEFT LOWER EXTREMITY:  full range of motion, normal strength & tone RIGHT LOWER EXTREMITY: strength and range of motion difficult to assess   PSYCHIATRIC: the patient is alert & oriented to person, affect & behavior appropriate  LABS/RADIOLOGY: White count 13, hemoglobin 13, platelets 170.    PT 14.4, INR 1.14.    Glucose 174, otherwise BMP normal.    ASSESSMENT/PLAN:  Right knee osteoarthritis.  Status post right total knee arthroplasty.  Patient has achieved in-facility goals.   Therefore, he is scheduled to be discharged tomorrow.     Allergic rhinitis.  Well controlled.    GERD.  Well controlled.      I have reviewed patient's medical records received at admission/from hospitalization.  CPT CODE: 78295

## 2013-07-16 ENCOUNTER — Other Ambulatory Visit: Payer: Self-pay

## 2013-09-23 ENCOUNTER — Encounter: Payer: Self-pay | Admitting: Pulmonary Disease

## 2013-09-23 ENCOUNTER — Ambulatory Visit (INDEPENDENT_AMBULATORY_CARE_PROVIDER_SITE_OTHER): Payer: Medicare Other | Admitting: Pulmonary Disease

## 2013-09-23 VITALS — BP 118/64 | HR 74 | Temp 98.0°F | Ht 72.0 in | Wt 258.6 lb

## 2013-09-23 DIAGNOSIS — G4733 Obstructive sleep apnea (adult) (pediatric): Secondary | ICD-10-CM

## 2013-09-23 NOTE — Progress Notes (Signed)
   Subjective:    Patient ID: Brian Mosses Sr., male    DOB: 1941-06-27, 73 y.o.   MRN: [id number]  HPI The patient comes in today for followup of his obstructive sleep apnea. He is continuing to wear CPAP compliantly, and is having no issues with his mask fit or pressure. He is continuing to have some sleep disruption because of chronic pain, but overall is doing adequately. He has lost 16 pounds since last visit, and I have commended him on this.   Review of Systems  Constitutional: Negative for fever and unexpected weight change.  HENT: Positive for congestion ( in AM). Negative for dental problem, ear pain, nosebleeds, postnasal drip, rhinorrhea, sinus pressure, sneezing, sore throat and trouble swallowing.        Allergies  Eyes: Negative for redness and itching.  Respiratory: Negative for cough, chest tightness, shortness of breath and wheezing.   Cardiovascular: Negative for palpitations and leg swelling.  Gastrointestinal: Negative for nausea and vomiting.  Genitourinary: Negative for dysuria.  Musculoskeletal: Negative for joint swelling.  Skin: Negative for rash.  Neurological: Negative for headaches.  Hematological: Does not bruise/bleed easily.  Psychiatric/Behavioral: Negative for dysphoric mood. The patient is not nervous/anxious.        Objective:   Physical Exam Overweight male in no acute distress Nose without purulence or discharge noted No skin breakdown or pressure necrosis from the CPAP mask Neck without lymphadenopathy or thyromegaly Lower extremities with mild edema, no cyanosis Alert and oriented, moves all 4 extremities.       Assessment & Plan:

## 2013-09-23 NOTE — Patient Instructions (Signed)
Continue with cpap, and keep up with mask changes and supplies. Keep working on weight loss followup with me in one year if doing well.

## 2013-09-23 NOTE — Assessment & Plan Note (Signed)
The patient appears to be doing well from a sleep apnea standpoint on his CPAP. He is wearing compliantly, and is having no issues with his equipment. He continues to have some sleep disruption related to musculoskeletal pain. He has lost significant weight since last visit, and I've asked him to continue doing this. He will followup with me again in one year.

## 2013-11-17 ENCOUNTER — Telehealth: Payer: Self-pay | Admitting: Pulmonary Disease

## 2013-11-17 DIAGNOSIS — G4733 Obstructive sleep apnea (adult) (pediatric): Secondary | ICD-10-CM

## 2013-11-17 NOTE — Telephone Encounter (Signed)
I spoke with the pt and he states he contacted [facility] x 1 week ago to get a new machine. He states he has not heard anything further. I advised that I do not see where we have sent an order so this may be the hold up. He states [facility] was supposed to contact us about this. I do not see anything in the pt chart. I called [facility] and was advised that we need to send an order along with ov note and sleep study. Order placed. Pt is aware. [place] Bing, CMA

## 2013-11-25 ENCOUNTER — Telehealth: Payer: Self-pay | Admitting: Pulmonary Disease

## 2013-11-25 NOTE — Telephone Encounter (Signed)
Spoke with [facility]-where order was sent. Was advised by Maudie Mercury they show they have been trying to contact pt but no answer. Verified they do have correct phone # listed for pt. They will try contacting pt again.   Called and made pt aware. aslo gave him [facility] #. Nothing further needed

## 2013-12-03 ENCOUNTER — Telehealth: Payer: Self-pay | Admitting: Pulmonary Disease

## 2013-12-03 NOTE — Telephone Encounter (Signed)
Called # listed and spoke with McCalla from [facility]. She is going to have to check with the local facility to see if they have s10 machines in and if they can order this machine for pt. They will give [place] a call back

## 2013-12-04 NOTE — Telephone Encounter (Signed)
Brian Ellison from [facility] has called back.  He states they do have an S10 now.

## 2014-02-04 ENCOUNTER — Encounter: Payer: Self-pay | Admitting: Pulmonary Disease

## 2014-02-04 ENCOUNTER — Ambulatory Visit (INDEPENDENT_AMBULATORY_CARE_PROVIDER_SITE_OTHER): Payer: Medicare Other | Admitting: Pulmonary Disease

## 2014-02-04 VITALS — BP 120/80 | HR 63 | Temp 98.1°F | Ht 72.0 in | Wt 263.4 lb

## 2014-02-04 DIAGNOSIS — G4733 Obstructive sleep apnea (adult) (pediatric): Secondary | ICD-10-CM

## 2014-02-04 NOTE — Patient Instructions (Signed)
Continue on cpap, and keep up with the mask changes and supplies. Work on weight loss.  Cancel upcoming apptm with me, and reschedule for one year.  Please call if you are not doing well.

## 2014-02-04 NOTE — Progress Notes (Signed)
   Subjective:    Patient ID: Brian Mosses Sr., male    DOB: November 09, 1940, 73 y.o.   MRN: [id number]  HPI The patient comes in today for followup of his obstructive sleep apnea. He is been started on a new device with the same pressure, and has done well since the last visit. He denies any issues with his mask fit or pressure, and feels that he sleeps fairly well with the device. He denies having any significant daytime sleepiness with inactivity. Of note, his weight is up 5 pounds since the last visit.   Review of Systems  Constitutional: Negative for fever and unexpected weight change.  HENT: Negative for congestion, dental problem, ear pain, nosebleeds, postnasal drip, rhinorrhea, sinus pressure, sneezing, sore throat and trouble swallowing.   Eyes: Negative for redness and itching.  Respiratory: Negative for cough, chest tightness, shortness of breath and wheezing.   Cardiovascular: Negative for palpitations and leg swelling.  Gastrointestinal: Negative for nausea and vomiting.  Genitourinary: Negative for dysuria.  Musculoskeletal: Negative for joint swelling.  Skin: Negative for rash.  Neurological: Negative for headaches.  Hematological: Does not bruise/bleed easily.  Psychiatric/Behavioral: Negative for dysphoric mood. The patient is not nervous/anxious.        Objective:   Physical Exam Obese male in no acute distress Nose without purulence or discharge noted No skin breakdown or pressure necrosis from the CPAP mask Neck without lymphadenopathy or thyromegaly Lower extremities with mild edema, no cyanosis Alert and oriented, moves all 4 extremities.       Assessment & Plan:

## 2014-02-04 NOTE — Assessment & Plan Note (Signed)
The patient appears to be doing fairly well on his new CPAP device, and I've asked him to keep up with his mask changes and supplies, and as well as working on aggressive weight loss.  I've also encouraged him to work aggressively on weight loss.

## 2014-05-21 ENCOUNTER — Telehealth: Payer: Self-pay | Admitting: Pulmonary Disease

## 2014-05-21 NOTE — Telephone Encounter (Signed)
Called pt. He reports he has not heard from [facility] about getting supplies for CPAP since March.  He called some [phone] # and was told they did not have anything from [place] for him to get supplies. I called local office [phone] and spoke with Iris. [facility] reports they have hand delivered a CPAP compliance form for [place] to sign x 3. They have not received anything back. Per iris they were told they can't fax this to [place], it needs to be hand delivered. She thinks [facility]ida may have this. Please advise thanks

## 2014-05-21 NOTE — Telephone Encounter (Signed)
lmomtcb for pt on both #s provided above.

## 2014-05-21 NOTE — Telephone Encounter (Signed)
Pt returned call [phone]

## 2014-05-21 NOTE — Telephone Encounter (Signed)
Dr Gwenette Greet signs cmns once a month, all were signed the weekend of labor day.  We have 1 form for recert pap that was hand delivered after Dr Gwenette Greet signing. Dr Gwenette Greet has signed this form today and was faxed to [place].  Called and spoke to [employer] and [employer] in re to this, they said will process pt supplies on Monday. Tried to call pt to inform and got VM. Marland KitchenVerdie Mosher

## 2014-05-24 ENCOUNTER — Telehealth: Payer: Self-pay | Admitting: Pulmonary Disease

## 2014-05-24 NOTE — Telephone Encounter (Signed)
Spoke with York Cerise at [employer] of [facility] for supplies)-states he is unsure why someone called our office about the patient needing CPAP supplies or not. They understand its their duty to contact the patient to check on this or the patient calls them when they need to order supplies. Nothing is needed from our office on this patient-will sign off on message.

## 2014-09-14 DIAGNOSIS — C44722 Squamous cell carcinoma of skin of right lower limb, including hip: Secondary | ICD-10-CM | POA: Diagnosis not present

## 2014-09-14 DIAGNOSIS — L858 Other specified epidermal thickening: Secondary | ICD-10-CM | POA: Diagnosis not present

## 2014-09-16 DIAGNOSIS — H524 Presbyopia: Secondary | ICD-10-CM | POA: Diagnosis not present

## 2014-09-16 DIAGNOSIS — E119 Type 2 diabetes mellitus without complications: Secondary | ICD-10-CM | POA: Diagnosis not present

## 2014-09-16 DIAGNOSIS — H4011X2 Primary open-angle glaucoma, moderate stage: Secondary | ICD-10-CM | POA: Diagnosis not present

## 2014-09-23 ENCOUNTER — Ambulatory Visit: Payer: Medicare Other | Admitting: Pulmonary Disease

## 2014-10-15 DIAGNOSIS — Z08 Encounter for follow-up examination after completed treatment for malignant neoplasm: Secondary | ICD-10-CM | POA: Diagnosis not present

## 2014-10-15 DIAGNOSIS — Z85828 Personal history of other malignant neoplasm of skin: Secondary | ICD-10-CM | POA: Diagnosis not present

## 2014-11-16 DIAGNOSIS — L821 Other seborrheic keratosis: Secondary | ICD-10-CM | POA: Diagnosis not present

## 2014-11-16 DIAGNOSIS — D485 Neoplasm of uncertain behavior of skin: Secondary | ICD-10-CM | POA: Diagnosis not present

## 2014-11-16 DIAGNOSIS — L820 Inflamed seborrheic keratosis: Secondary | ICD-10-CM | POA: Diagnosis not present

## 2014-11-16 DIAGNOSIS — D225 Melanocytic nevi of trunk: Secondary | ICD-10-CM | POA: Diagnosis not present

## 2014-11-16 DIAGNOSIS — L570 Actinic keratosis: Secondary | ICD-10-CM | POA: Diagnosis not present

## 2014-11-16 DIAGNOSIS — X32XXXD Exposure to sunlight, subsequent encounter: Secondary | ICD-10-CM | POA: Diagnosis not present

## 2014-11-30 DIAGNOSIS — S63652A Sprain of metacarpophalangeal joint of right middle finger, initial encounter: Secondary | ICD-10-CM | POA: Diagnosis not present

## 2014-12-02 DIAGNOSIS — N3941 Urge incontinence: Secondary | ICD-10-CM | POA: Diagnosis not present

## 2014-12-02 DIAGNOSIS — R350 Frequency of micturition: Secondary | ICD-10-CM | POA: Diagnosis not present

## 2014-12-31 DIAGNOSIS — M79671 Pain in right foot: Secondary | ICD-10-CM | POA: Diagnosis not present

## 2014-12-31 DIAGNOSIS — S63652D Sprain of metacarpophalangeal joint of right middle finger, subsequent encounter: Secondary | ICD-10-CM | POA: Diagnosis not present

## 2014-12-31 DIAGNOSIS — L600 Ingrowing nail: Secondary | ICD-10-CM | POA: Diagnosis not present

## 2015-01-13 DIAGNOSIS — M792 Neuralgia and neuritis, unspecified: Secondary | ICD-10-CM | POA: Diagnosis not present

## 2015-01-13 DIAGNOSIS — G609 Hereditary and idiopathic neuropathy, unspecified: Secondary | ICD-10-CM | POA: Diagnosis not present

## 2015-01-20 DIAGNOSIS — M792 Neuralgia and neuritis, unspecified: Secondary | ICD-10-CM | POA: Diagnosis not present

## 2015-01-20 DIAGNOSIS — G5751 Tarsal tunnel syndrome, right lower limb: Secondary | ICD-10-CM | POA: Diagnosis not present

## 2015-01-20 DIAGNOSIS — M7751 Other enthesopathy of right foot: Secondary | ICD-10-CM | POA: Diagnosis not present

## 2015-01-27 DIAGNOSIS — M792 Neuralgia and neuritis, unspecified: Secondary | ICD-10-CM | POA: Diagnosis not present

## 2015-01-27 DIAGNOSIS — B351 Tinea unguium: Secondary | ICD-10-CM | POA: Diagnosis not present

## 2015-01-27 DIAGNOSIS — M79671 Pain in right foot: Secondary | ICD-10-CM | POA: Diagnosis not present

## 2015-02-02 ENCOUNTER — Ambulatory Visit (INDEPENDENT_AMBULATORY_CARE_PROVIDER_SITE_OTHER): Payer: Medicare Other | Admitting: Pulmonary Disease

## 2015-02-02 ENCOUNTER — Encounter: Payer: Self-pay | Admitting: Pulmonary Disease

## 2015-02-02 VITALS — BP 118/72 | HR 66 | Temp 98.2°F | Ht 72.0 in | Wt 262.4 lb

## 2015-02-02 DIAGNOSIS — G4733 Obstructive sleep apnea (adult) (pediatric): Secondary | ICD-10-CM

## 2015-02-02 NOTE — Progress Notes (Signed)
   Subjective:    Patient ID: Brian Mosses Sr., male    DOB: [birth year], 74 y.o.   MRN: [id number]  HPI The patient comes in today for follow-up of his obstructive sleep apnea. He is wearing C Pap compliantly, and is having no issues with his mask fit or pressure. His download shows excellent compliance, and only intermittent mask leak. He is having some breakthrough apnea on his fixed pressure of 11 cm.   Review of Systems  Constitutional: Negative.  Negative for fever and unexpected weight change.  HENT: Positive for congestion and rhinorrhea. Negative for dental problem, ear pain, nosebleeds, postnasal drip, sinus pressure, sneezing, sore throat and trouble swallowing.   Eyes: Negative for redness and itching.  Respiratory: Negative for cough, chest tightness, shortness of breath and wheezing.   Cardiovascular: Negative for palpitations and leg swelling.  Gastrointestinal: Negative.  Negative for nausea and vomiting.  Endocrine: Negative.  Negative for cold intolerance and polydipsia.  Genitourinary: Negative.  Negative for dysuria, flank pain and penile swelling.  Musculoskeletal: Negative.  Negative for joint swelling.  Skin: Negative for rash.  Allergic/Immunologic: Negative.   Neurological: Negative.  Negative for headaches.  Hematological: Negative.  Does not bruise/bleed easily.  Psychiatric/Behavioral: Negative.  Negative for dysphoric mood. The patient is not nervous/anxious.        Objective:   Physical Exam Obese male in no acute distress Nose without purulence or discharge noted No skin breakdown or pressure necrosis from the C Pap mask Neck without lymphadenopathy or thyromegaly Lower extremities with minimal edema, no cyanosis Alert and oriented, moves all 4 extremities.       Assessment & Plan:

## 2015-02-02 NOTE — Assessment & Plan Note (Signed)
The patient feels that he is doing very well with this EPAP device, but he is having some breakthrough events and some restless sleep. He currently is on a set pressure, but will change him over to the auto setting to see if he does better on this. I have encouraged him to work aggressively on weight loss, and to keep up with his supplies. He will follow-up in one year, but is to call if he does not like the auto setting.

## 2015-02-02 NOTE — Patient Instructions (Signed)
Will have your cpap device put on auto setting to better control your sleep apnea. Keep up with mask changes and supplies. Work on weight loss followup again in one year with Dr. Halford Chessman.

## 2015-02-24 DIAGNOSIS — M79674 Pain in right toe(s): Secondary | ICD-10-CM | POA: Diagnosis not present

## 2015-02-24 DIAGNOSIS — M79675 Pain in left toe(s): Secondary | ICD-10-CM | POA: Diagnosis not present

## 2015-02-24 DIAGNOSIS — B351 Tinea unguium: Secondary | ICD-10-CM | POA: Diagnosis not present

## 2015-03-01 DIAGNOSIS — H43813 Vitreous degeneration, bilateral: Secondary | ICD-10-CM | POA: Diagnosis not present

## 2015-03-01 DIAGNOSIS — H35373 Puckering of macula, bilateral: Secondary | ICD-10-CM | POA: Diagnosis not present

## 2015-03-02 DIAGNOSIS — M19012 Primary osteoarthritis, left shoulder: Secondary | ICD-10-CM | POA: Diagnosis not present

## 2015-03-02 DIAGNOSIS — Z96611 Presence of right artificial shoulder joint: Secondary | ICD-10-CM | POA: Diagnosis not present

## 2015-03-02 DIAGNOSIS — Z96651 Presence of right artificial knee joint: Secondary | ICD-10-CM | POA: Diagnosis not present

## 2015-03-02 DIAGNOSIS — Z471 Aftercare following joint replacement surgery: Secondary | ICD-10-CM | POA: Diagnosis not present

## 2015-03-03 DIAGNOSIS — N401 Enlarged prostate with lower urinary tract symptoms: Secondary | ICD-10-CM | POA: Diagnosis not present

## 2015-03-03 DIAGNOSIS — N3941 Urge incontinence: Secondary | ICD-10-CM | POA: Diagnosis not present

## 2015-03-03 DIAGNOSIS — N138 Other obstructive and reflux uropathy: Secondary | ICD-10-CM | POA: Diagnosis not present

## 2015-03-28 DIAGNOSIS — B351 Tinea unguium: Secondary | ICD-10-CM | POA: Diagnosis not present

## 2015-03-28 DIAGNOSIS — M79674 Pain in right toe(s): Secondary | ICD-10-CM | POA: Diagnosis not present

## 2015-03-28 DIAGNOSIS — M79675 Pain in left toe(s): Secondary | ICD-10-CM | POA: Diagnosis not present

## 2015-03-29 DIAGNOSIS — M5136 Other intervertebral disc degeneration, lumbar region: Secondary | ICD-10-CM | POA: Diagnosis not present

## 2015-04-12 DIAGNOSIS — X32XXXD Exposure to sunlight, subsequent encounter: Secondary | ICD-10-CM | POA: Diagnosis not present

## 2015-04-12 DIAGNOSIS — L570 Actinic keratosis: Secondary | ICD-10-CM | POA: Diagnosis not present

## 2015-04-12 DIAGNOSIS — D485 Neoplasm of uncertain behavior of skin: Secondary | ICD-10-CM | POA: Diagnosis not present

## 2015-04-13 DIAGNOSIS — N3941 Urge incontinence: Secondary | ICD-10-CM | POA: Diagnosis not present

## 2015-04-13 DIAGNOSIS — R350 Frequency of micturition: Secondary | ICD-10-CM | POA: Diagnosis not present

## 2015-04-21 DIAGNOSIS — H4011X2 Primary open-angle glaucoma, moderate stage: Secondary | ICD-10-CM | POA: Diagnosis not present

## 2015-04-21 DIAGNOSIS — E119 Type 2 diabetes mellitus without complications: Secondary | ICD-10-CM | POA: Diagnosis not present

## 2015-04-25 DIAGNOSIS — B351 Tinea unguium: Secondary | ICD-10-CM | POA: Diagnosis not present

## 2015-04-25 DIAGNOSIS — M79675 Pain in left toe(s): Secondary | ICD-10-CM | POA: Diagnosis not present

## 2015-04-25 DIAGNOSIS — M79674 Pain in right toe(s): Secondary | ICD-10-CM | POA: Diagnosis not present

## 2015-06-03 DIAGNOSIS — N401 Enlarged prostate with lower urinary tract symptoms: Secondary | ICD-10-CM | POA: Diagnosis not present

## 2015-06-03 DIAGNOSIS — N138 Other obstructive and reflux uropathy: Secondary | ICD-10-CM | POA: Diagnosis not present

## 2015-06-08 ENCOUNTER — Other Ambulatory Visit: Payer: Self-pay | Admitting: Urology

## 2015-06-09 DIAGNOSIS — Z23 Encounter for immunization: Secondary | ICD-10-CM | POA: Diagnosis not present

## 2015-06-16 DIAGNOSIS — S63652D Sprain of metacarpophalangeal joint of right middle finger, subsequent encounter: Secondary | ICD-10-CM | POA: Diagnosis not present

## 2015-07-08 ENCOUNTER — Encounter (HOSPITAL_BASED_OUTPATIENT_CLINIC_OR_DEPARTMENT_OTHER): Payer: Self-pay | Admitting: *Deleted

## 2015-07-08 NOTE — Progress Notes (Signed)
NPO AFTER MN.  ARRIVE AT 0715. NEEDS ISTAT AND EKG.  WILL TAKE GABAPENTIN AND PRILOSEC AM DOS W/ SIPS OF WATER.

## 2015-07-15 ENCOUNTER — Encounter (HOSPITAL_BASED_OUTPATIENT_CLINIC_OR_DEPARTMENT_OTHER)
Admission: RE | Disposition: A | Discharge status: Home / Self Care | Payer: Self-pay | Source: Ambulatory Visit | Attending: Urology

## 2015-07-15 ENCOUNTER — Ambulatory Visit (HOSPITAL_BASED_OUTPATIENT_CLINIC_OR_DEPARTMENT_OTHER): Payer: Medicare Other | Admitting: Anesthesiology

## 2015-07-15 ENCOUNTER — Other Ambulatory Visit: Payer: Self-pay

## 2015-07-15 ENCOUNTER — Encounter (HOSPITAL_BASED_OUTPATIENT_CLINIC_OR_DEPARTMENT_OTHER): Payer: Self-pay | Admitting: *Deleted

## 2015-07-15 ENCOUNTER — Ambulatory Visit (HOSPITAL_BASED_OUTPATIENT_CLINIC_OR_DEPARTMENT_OTHER)
Admission: RE | Admit: 2015-07-15 | Discharge: 2015-07-15 | Disposition: A | Discharge status: Home / Self Care | Payer: Medicare Other | Source: Ambulatory Visit | Attending: Urology | Admitting: Urology

## 2015-07-15 DIAGNOSIS — N401 Enlarged prostate with lower urinary tract symptoms: Secondary | ICD-10-CM | POA: Insufficient documentation

## 2015-07-15 DIAGNOSIS — Z87442 Personal history of urinary calculi: Secondary | ICD-10-CM | POA: Diagnosis not present

## 2015-07-15 DIAGNOSIS — G4733 Obstructive sleep apnea (adult) (pediatric): Secondary | ICD-10-CM | POA: Diagnosis not present

## 2015-07-15 DIAGNOSIS — E785 Hyperlipidemia, unspecified: Secondary | ICD-10-CM | POA: Diagnosis not present

## 2015-07-15 DIAGNOSIS — Z9842 Cataract extraction status, left eye: Secondary | ICD-10-CM | POA: Diagnosis not present

## 2015-07-15 DIAGNOSIS — Z9841 Cataract extraction status, right eye: Secondary | ICD-10-CM | POA: Insufficient documentation

## 2015-07-15 DIAGNOSIS — Z961 Presence of intraocular lens: Secondary | ICD-10-CM | POA: Insufficient documentation

## 2015-07-15 DIAGNOSIS — G4730 Sleep apnea, unspecified: Secondary | ICD-10-CM | POA: Diagnosis not present

## 2015-07-15 DIAGNOSIS — Z96651 Presence of right artificial knee joint: Secondary | ICD-10-CM | POA: Diagnosis not present

## 2015-07-15 DIAGNOSIS — E119 Type 2 diabetes mellitus without complications: Secondary | ICD-10-CM | POA: Insufficient documentation

## 2015-07-15 DIAGNOSIS — Z79899 Other long term (current) drug therapy: Secondary | ICD-10-CM | POA: Insufficient documentation

## 2015-07-15 DIAGNOSIS — Z7984 Long term (current) use of oral hypoglycemic drugs: Secondary | ICD-10-CM | POA: Diagnosis not present

## 2015-07-15 DIAGNOSIS — R3915 Urgency of urination: Secondary | ICD-10-CM | POA: Insufficient documentation

## 2015-07-15 DIAGNOSIS — M1990 Unspecified osteoarthritis, unspecified site: Secondary | ICD-10-CM | POA: Insufficient documentation

## 2015-07-15 DIAGNOSIS — Z7982 Long term (current) use of aspirin: Secondary | ICD-10-CM | POA: Insufficient documentation

## 2015-07-15 DIAGNOSIS — N3080 Other cystitis without hematuria: Secondary | ICD-10-CM | POA: Diagnosis not present

## 2015-07-15 DIAGNOSIS — E669 Obesity, unspecified: Secondary | ICD-10-CM | POA: Diagnosis not present

## 2015-07-15 DIAGNOSIS — Z6833 Body mass index (BMI) 33.0-33.9, adult: Secondary | ICD-10-CM | POA: Diagnosis not present

## 2015-07-15 DIAGNOSIS — K219 Gastro-esophageal reflux disease without esophagitis: Secondary | ICD-10-CM | POA: Diagnosis not present

## 2015-07-15 DIAGNOSIS — Z87891 Personal history of nicotine dependence: Secondary | ICD-10-CM | POA: Insufficient documentation

## 2015-07-15 DIAGNOSIS — L538 Other specified erythematous conditions: Secondary | ICD-10-CM | POA: Insufficient documentation

## 2015-07-15 DIAGNOSIS — G709 Myoneural disorder, unspecified: Secondary | ICD-10-CM | POA: Diagnosis not present

## 2015-07-15 DIAGNOSIS — D414 Neoplasm of uncertain behavior of bladder: Secondary | ICD-10-CM | POA: Diagnosis not present

## 2015-07-15 DIAGNOSIS — R350 Frequency of micturition: Secondary | ICD-10-CM | POA: Diagnosis not present

## 2015-07-15 DIAGNOSIS — H409 Unspecified glaucoma: Secondary | ICD-10-CM | POA: Insufficient documentation

## 2015-07-15 DIAGNOSIS — N411 Chronic prostatitis: Secondary | ICD-10-CM | POA: Diagnosis not present

## 2015-07-15 DIAGNOSIS — D494 Neoplasm of unspecified behavior of bladder: Secondary | ICD-10-CM | POA: Diagnosis not present

## 2015-07-15 HISTORY — DX: Dependence on other enabling machines and devices: Z99.89

## 2015-07-15 HISTORY — DX: Personal history of other diseases of the digestive system: Z87.19

## 2015-07-15 HISTORY — DX: Induration penis plastica: N48.6

## 2015-07-15 HISTORY — DX: Benign prostatic hyperplasia without lower urinary tract symptoms: N40.0

## 2015-07-15 HISTORY — DX: Type 2 diabetes mellitus without complications: E11.9

## 2015-07-15 HISTORY — DX: Unspecified symptoms and signs involving the genitourinary system: R39.9

## 2015-07-15 HISTORY — DX: Presence of other specified functional implants: Z96.89

## 2015-07-15 HISTORY — DX: Other specified postprocedural states: Z98.890

## 2015-07-15 HISTORY — DX: Personal history of other (healed) physical injury and trauma: Z87.828

## 2015-07-15 HISTORY — PX: CYSTOSCOPY WITH BIOPSY: SHX5122

## 2015-07-15 HISTORY — DX: Neoplasm of uncertain behavior of bladder: D41.4

## 2015-07-15 HISTORY — DX: Other chronic pain: G89.29

## 2015-07-15 HISTORY — DX: Dorsalgia, unspecified: M54.9

## 2015-07-15 HISTORY — DX: Male erectile dysfunction, unspecified: N52.9

## 2015-07-15 HISTORY — DX: Obstructive sleep apnea (adult) (pediatric): G47.33

## 2015-07-15 LAB — POCT I-STAT 4, (NA,K, GLUC, HGB,HCT)
Glucose, Bld: 219 mg/dL — ABNORMAL HIGH (ref 65–99)
HCT: 42 % (ref 39.0–52.0)
HEMOGLOBIN: 14.3 g/dL (ref 13.0–17.0)
POTASSIUM: 3.8 mmol/L (ref 3.5–5.1)
Sodium: 140 mmol/L (ref 135–145)

## 2015-07-15 LAB — GLUCOSE, CAPILLARY: Glucose-Capillary: 187 mg/dL — ABNORMAL HIGH (ref 65–99)

## 2015-07-15 SURGERY — CYSTOSCOPY, WITH BIOPSY
Anesthesia: General | Site: Bladder

## 2015-07-15 MED ORDER — LACTATED RINGERS IV SOLN
INTRAVENOUS | Status: DC
  Administered 2015-07-15 (×2): via INTRAVENOUS
  Filled 2015-07-15: qty 1000

## 2015-07-15 MED ORDER — PROMETHAZINE HCL 25 MG/ML IJ SOLN
6.2500 mg | INTRAMUSCULAR | Status: DC | PRN
  Filled 2015-07-15: qty 1

## 2015-07-15 MED ORDER — FENTANYL CITRATE (PF) 100 MCG/2ML IJ SOLN
INTRAMUSCULAR | Status: AC
  Filled 2015-07-15: qty 2

## 2015-07-15 MED ORDER — FENTANYL CITRATE (PF) 100 MCG/2ML IJ SOLN
INTRAMUSCULAR | Status: DC | PRN
  Administered 2015-07-15 (×2): 25 ug via INTRAVENOUS
  Administered 2015-07-15: 50 ug via INTRAVENOUS

## 2015-07-15 MED ORDER — FENTANYL CITRATE (PF) 100 MCG/2ML IJ SOLN
25.0000 ug | INTRAMUSCULAR | Status: DC | PRN
  Filled 2015-07-15: qty 1

## 2015-07-15 MED ORDER — CEFAZOLIN SODIUM-DEXTROSE 2-3 GM-% IV SOLR
INTRAVENOUS | Status: AC
  Filled 2015-07-15: qty 50

## 2015-07-15 MED ORDER — CEFAZOLIN SODIUM 1-5 GM-% IV SOLN
1.0000 g | INTRAVENOUS | Status: DC
  Filled 2015-07-15: qty 50

## 2015-07-15 MED ORDER — LIDOCAINE HCL (CARDIAC) 20 MG/ML IV SOLN
INTRAVENOUS | Status: DC | PRN
  Administered 2015-07-15: 60 mg via INTRAVENOUS

## 2015-07-15 MED ORDER — PROPOFOL 10 MG/ML IV BOLUS
INTRAVENOUS | Status: DC | PRN
  Administered 2015-07-15: 180 mg via INTRAVENOUS

## 2015-07-15 MED ORDER — ONDANSETRON HCL 4 MG/2ML IJ SOLN
INTRAMUSCULAR | Status: DC | PRN
  Administered 2015-07-15: 4 mg via INTRAVENOUS

## 2015-07-15 MED ORDER — ASPIRIN 81 MG PO TABS: 81.0000 mg | ORAL_TABLET | Freq: Every day | ORAL | Status: DC

## 2015-07-15 MED ORDER — LIDOCAINE HCL 2 % EX GEL
CUTANEOUS | Status: DC | PRN
  Administered 2015-07-15: 1

## 2015-07-15 MED ORDER — SODIUM CHLORIDE 0.9 % IR SOLN
Status: DC | PRN
  Administered 2015-07-15 (×2): 3000 mL via INTRAVESICAL

## 2015-07-15 MED ORDER — CEFAZOLIN SODIUM-DEXTROSE 2-3 GM-% IV SOLR
2.0000 g | INTRAVENOUS | Status: AC
  Administered 2015-07-15: 2 g via INTRAVENOUS
  Filled 2015-07-15: qty 50

## 2015-07-15 SURGICAL SUPPLY — 17 items
BAG DRAIN URO-CYSTO SKYTR STRL (DRAIN) ×2 IMPLANT
BAG URINE DRAINAGE (UROLOGICAL SUPPLIES) ×2 IMPLANT
CATH FOLEY 2WAY SLVR  5CC 16FR (CATHETERS) ×1
CATH FOLEY 2WAY SLVR 5CC 16FR (CATHETERS) ×1 IMPLANT
CLOTH BEACON ORANGE TIMEOUT ST (SAFETY) ×2 IMPLANT
ELECT REM PT RETURN 9FT ADLT (ELECTROSURGICAL) ×2
ELECTRODE REM PT RTRN 9FT ADLT (ELECTROSURGICAL) ×1 IMPLANT
GLOVE BIO SURGEON STRL SZ7.5 (GLOVE) ×2 IMPLANT
GOWN STRL REUS W/ TWL XL LVL3 (GOWN DISPOSABLE) ×1 IMPLANT
GOWN STRL REUS W/TWL XL LVL3 (GOWN DISPOSABLE) ×1
HOLDER FOLEY CATH W/STRAP (MISCELLANEOUS) ×2 IMPLANT
IV NS IRRIG 3000ML ARTHROMATIC (IV SOLUTION) ×4 IMPLANT
KIT ROOM TURNOVER WOR (KITS) ×2 IMPLANT
LOOP CUT BIPOLAR 24F LRG (ELECTROSURGICAL) ×2 IMPLANT
MANIFOLD NEPTUNE II (INSTRUMENTS) ×2 IMPLANT
NS IRRIG 500ML POUR BTL (IV SOLUTION) ×2 IMPLANT
PACK CYSTO (CUSTOM PROCEDURE TRAY) ×2 IMPLANT

## 2015-07-15 NOTE — Anesthesia Preprocedure Evaluation (Signed)
Anesthesia Evaluation  Patient identified by MRN, date of birth, ID band Patient awake    Reviewed: Allergy & Precautions, NPO status , Patient's Chart, lab work & pertinent test results  Airway Mallampati: II  TM Distance: >3 FB Neck ROM: Full    Dental no notable dental hx.    Pulmonary sleep apnea and Continuous Positive Airway Pressure Ventilation , former smoker,    Pulmonary exam normal breath sounds clear to auscultation       Cardiovascular Exercise Tolerance: Good negative cardio ROS Normal cardiovascular exam Rhythm:Regular Rate:Normal     Neuro/Psych  Neuromuscular disease negative psych ROS   GI/Hepatic Neg liver ROS, GERD  Medicated,  Endo/Other  diabetes, Type 2, Oral Hypoglycemic Agents  Renal/GU negative Renal ROS  negative genitourinary   Musculoskeletal  (+) Arthritis ,   Abdominal (+) + obese,   Peds negative pediatric ROS (+)  Hematology negative hematology ROS (+)   Anesthesia Other Findings   Reproductive/Obstetrics negative OB ROS                             Anesthesia Physical Anesthesia Plan  ASA: II  Anesthesia Plan: General   Post-op Pain Management:    Induction: Intravenous  Airway Management Planned: LMA  Additional Equipment:   Intra-op Plan:   Post-operative Plan: Extubation in OR  Informed Consent: I have reviewed the patients History and Physical, chart, labs and discussed the procedure including the risks, benefits and alternatives for the proposed anesthesia with the patient or authorized representative who has indicated his/her understanding and acceptance.   Dental advisory given  Plan Discussed with: CRNA  Anesthesia Plan Comments:         Anesthesia Quick Evaluation

## 2015-07-15 NOTE — Op Note (Signed)
Preoperative diagnosis: Irritative voiding symptoms, bladder erythema, prostatic urethra neoplasm Postoperative diagnosis: Same  Procedure: Exam under anesthesia, cystoscopy with bladder biopsy and prostatic urethral biopsy with fulguration  Surgeon: Junious Silk  Anesthesia: Gen.  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  He was brought in for exam and biopsies.  Findings: On exam under anesthesia the penis was circumcised and without mass or lesion.  A penile prosthesis was palpably normal.  The penile prosthesis cycled normally.  The testicles were descended bilaterally and palpably normal, the pump was palpable in the scrotum and palpably normal.  On digital rectal exam the prostate was small and without hard area or nodules.    On cystoscopy the urethra appeared normal, the prostatic urethra was short with some mild lateral lobe obstructionWith the bladder neck was widely patent.  There was some papillary frons of the prostatic urethra near the apex.  In the bladder there was some  More glomerulations appearance than actually erythema or tumor.  This was biopsied and fulgurated.  The trigone was otherwise normal, the ureteral orifices were in the normal orthotopic position with clear reflux.  The bladder contained no stone or foreign body.  The remainder of the bladder mucosa appeared normal.  In the left space of Retzius the reservoir was noted to be indenting the bladder.  Description of procedure: After consent was obtained the patient brought to the operating room.  After adequate anesthesia is placed in lithotomy position and prepped and draped in the usual sterile fashion.  A timeout was performed to confirm the patient and procedure.  The cystoscope was passed per  Urethra.  The bladder was carefully inspected with the 30 and 70 lens.  The resectoscope was passed and the cold  cup used to bx the trigone. The loop was passed and the Area fulgurated.  The loop was then used to take a biopsy of the prostatic urethra on the right left apex.  This area was fulgurated.  There was excellent hemostasis.  I placed an 71 French catheter for wake up.  He was awakened and taken to the recovery room in stable condition.  Complications: None  Blood loss: Minimal  Specimens to pathology: #1 prostatic urethral biopsy, #2 trigone/bladder biopsy  Drains: 63 French Foley which will be removed in the PACU.  Disposition: Patient stable to PACU

## 2015-07-15 NOTE — H&P (Signed)
H&P  Chief Complaint: frequency, urgency   History of Present Illness: Pt with frequency, urgency noted to have some erythematous area in bladder and papillary changes in prostatic urethra. He was brought for cysto, bbx, poss TURP/TURBT today. He has been well with no fever, dysuria or gross hematuria.   Past Medical History  Diagnosis Date  . Dyslipidemia     per pt questionable  . Allergic rhinitis   . Tingling     right arm;takes Gabapentin  . History of colon polyps   . Diverticulosis   . History of kidney stones     many yrs ago  . Glaucoma     BOTH EYES  . History of shingles   . OSA on CPAP     very severe per study 01-31-2006  . Chronic back pain   . GERD (gastroesophageal reflux disease)   . 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 History of gastritis   . History of diverticulitis of colon   . Bladder polyp   . ED (erectile dysfunction) of organic origin   . Peyronie's disease   . History of kidney stones   . Type 2 diabetes mellitus ([place])   . BPH (benign prostatic hypertrophy)   . Lower urinary tract symptoms (LUTS)   . Arthritis     SPINAL AND JOINTS  . S/P insertion of spinal cord stimulator     2010   Past Surgical History  Procedure Laterality Date  . Penile prosthesis implant  11-26-2007  . Colonoscopy    . Total knee arthroplasty Right 04/17/2013    Procedure: RIGHT TOTAL KNEE ARTHROPLASTY;  Surgeon: Augustin Schooling, MD;  Location: [place];  Service: Orthopedics;  Laterality: Right;  . Permanant spinal cord stimulator implant via thoracic laminectomy  05-12-2009    Medtronic generator (left buttock)  . Right shoulder hemiarthroplasty  08-20-2008  . Revision total arthroplasty right shoulder  07-22-2009  . Total knee arthroplasty Left 02-10-2009  . Anterior cervical decomp/discectomy fusion  06-16-2004    C3 -- C5  . Posterior laminectomy / decompression cervical spine  03-08-2005    C3  -- C6  .  Shoulder open rotator cuff repair Bilateral   . Nasal septum surgery  yrs ago  . Cataract extraction w/ intraocular lens  implant, bilateral    . Retinal detachment surgery Right   . Right heel surgery      spurs  . Appendectomy  age 74  . Inguinal hernia repair Bilateral 1970's  . Rotator cuff repair Bilateral x1 right/  x2  left - last one 2013    Home Medications:  Prescriptions prior to admission  Medication Sig Dispense Refill Last Dose  . aspirin 81 MG tablet Take 81 mg by mouth daily.     07/10/2015  . brimonidine (ALPHAGAN P) 0.1 % SOLN Place 1 drop into both eyes 3 (three) times daily.   07/15/2015 at 0600  . cetirizine (ZYRTEC) 10 MG tablet Take 10 mg by mouth every morning.   07/14/2015 at Unknown time  . Cholecalciferol (VITAMIN D3) 2000 UNITS TABS Take 1 tablet by mouth daily.     07/10/2015  . gabapentin (NEURONTIN) 600 MG tablet Take 600 mg by mouth 2 (two) times daily.   07/14/2015 at Unknown time  . metFORMIN (GLUCOPHAGE) 500 MG tablet Take 500 mg by mouth every evening.    07/14/2015 at Unknown time  . Multiple Vitamin (  MULTIVITAMIN) capsule Take 1 capsule by mouth daily.     07/10/2015  . Omega-3 Fatty Acids (FISH OIL) 1200 MG CAPS Take 1 capsule by mouth daily.     07/10/2015  . omeprazole (PRILOSEC) 20 MG capsule Take 40 mg by mouth 2 (two) times daily.    07/15/2015 at 0600  . Potassium 99 MG TABS Take 1 tablet by mouth every morning.   07/10/2015  . rOPINIRole (REQUIP) 1 MG tablet Take 1 mg by mouth at bedtime.     07/14/2015 at Unknown time  . flunisolide (NASAREL) 29 MCG/ACT (0.025%) nasal spray Place 2 sprays into the nose as needed. Dose is for each nostril.    Unknown at Unknown time   Allergies: No Known Allergies  History reviewed. No pertinent family history. Social History:  reports that he quit smoking about 44 years ago. His smoking use included Cigarettes. He has a 15 pack-year smoking history. He has never used smokeless tobacco. He reports that he drinks  alcohol. He reports that he does not use illicit drugs.  ROS: A complete review of systems was performed.  All systems are negative except for pertinent findings as noted. ROS   Physical Exam:  Vital signs in last 24 hours: Temp:  [98.5 F (36.9 C)] 98.5 F (36.9 C) (11/04 0720) Pulse Rate:  [66] 66 (11/04 0720) Resp:  [16] 16 (11/04 0720) BP: (124)/(66) 124/66 mmHg (11/04 0720) SpO2:  [95 %] 95 % (11/04 0720) Weight:  [113.399 kg (250 lb)] 113.399 kg (250 lb) (11/04 0720) General:  Alert and oriented, No acute distress HEENT: Normocephalic, atraumatic Neck: No JVD or lymphadenopathy Cardiovascular: Regular rate and rhythm Lungs: Regular rate and effort Abdomen: Soft, nontender, nondistended, no abdominal masses Back: No CVA tenderness Extremities: No edema Neurologic: Grossly intact  Laboratory Data:  Results for orders placed or performed during the hospital encounter of 07/15/15 (from the past 24 hour(s))  I-STAT 4, (NA,K, GLUC, HGB,HCT)     Status: Abnormal   Collection Time: 07/15/15  8:07 AM  Result Value Ref Range   Sodium 140 135 - 145 mmol/L   Potassium 3.8 3.5 - 5.1 mmol/L   Glucose, Bld 219 (H) 65 - 99 mg/dL   HCT 42.0 39.0 - 52.0 %   Hemoglobin 14.3 13.0 - 17.0 g/dL   No results found for this or any previous visit (from the past 240 hour(s)). Creatinine: No results for input(s): CREATININE in the last 168 hours.  Impression/Assessment:  Urinary frequency, urgency Bladder erythema Prostatic urethra neoplasm  Plan:  I discussed with the patient the nature, potential benefits, risks and alternatives to cysto, bbx, poss TURP/TURBT, including side effects of the proposed treatment, the likelihood of the patient achieving the goals of the procedure, and any potential problems that might occur during the procedure or recuperation. Discussed possible foley post-op. All questions answered. Patient elects to proceed.    Jalyne Brodzinski 07/15/2015, 8:53 AM

## 2015-07-15 NOTE — Transfer of Care (Signed)
Immediate Anesthesia Transfer of Care Note  Patient: Brian Capizzi Spurr Sr.  Procedure(s) Performed: Procedure(s): CYSTOSCOPY WITH BLADDER AND PROSTATE BIOPSY (N/A)  Patient Location: PACU  Anesthesia Type:General  Level of Consciousness: awake, alert  and oriented  Airway & Oxygen Therapy: Patient Spontanous Breathing and Patient connected to nasal cannula oxygen  Post-op Assessment: Report given to RN and Post -op Vital signs reviewed and stable  Post vital signs: Reviewed and stable  Last Vitals:  Filed Vitals:   07/15/15 0720  BP: 124/66  Pulse: 66  Temp: 36.9 C  Resp: 16    Complications: No apparent anesthesia complications

## 2015-07-15 NOTE — Discharge Instructions (Signed)
Cystoscopy, Care After Refer to this sheet in the next few weeks. These instructions provide you with information on caring for yourself after your procedure. Your caregiver may also give you more specific instructions. Your treatment has been planned according to current medical practices, but problems sometimes occur. Call your caregiver if you have any problems or questions after your procedure. HOME CARE INSTRUCTIONS  Things you can do to ease any discomfort after your procedure include:  Drinking enough water and fluids to keep your urine clear or pale yellow.  Taking a warm bath to relieve any burning feelings. SEEK IMMEDIATE MEDICAL CARE IF:   You have an increase in blood in your urine.  You notice blood clots in your urine.  You have difficulty passing urine.  You have the chills.  You have abdominal pain.  You have a fever or persistent symptoms for more than 2-3 days.  You have a fever and your symptoms suddenly get worse. MAKE SURE YOU:   Understand these instructions.  Will watch your condition.  Will get help right away if you are not doing well or get worse.   This information is not intended to replace advice given to you by your health care provider. Make sure you discuss any questions you have with your health care provider.   Document Released: 03/16/2005 Document Revised: 09/17/2014 Document Reviewed: 02/18/2012 Elsevier Interactive Patient Education 2016 [place] Anesthesia Home Care Instructions  Activity: Get plenty of rest for the remainder of the day. A responsible adult should stay with you for 24 hours following the procedure.  For the next 24 hours, DO NOT: -Drive a car -Paediatric nurse -Drink alcoholic beverages -Take any medication unless instructed by your physician -Make any legal decisions or sign important papers.  Meals: Start with liquid foods such as gelatin or soup. Progress to regular foods as tolerated. Avoid  greasy, spicy, heavy foods. If nausea and/or vomiting occur, drink only clear liquids until the nausea and/or vomiting subsides. Call your physician if vomiting continues.  Special Instructions/Symptoms: Your throat may feel dry or sore from the anesthesia or the breathing tube placed in your throat during surgery. If this causes discomfort, gargle with warm salt water. The discomfort should disappear within 24 hours.  If you had a scopolamine patch placed behind your ear for the management of post- operative nausea and/or vomiting:  1. The medication in the patch is effective for 72 hours, after which it should be removed.  Wrap patch in a tissue and discard in the trash. Wash hands thoroughly with soap and water. 2. You may remove the patch earlier than 72 hours if you experience unpleasant side effects which may include dry mouth, dizziness or visual disturbances. 3. Avoid touching the patch. Wash your hands with soap and water after contact with the patch.

## 2015-07-15 NOTE — Anesthesia Postprocedure Evaluation (Signed)
  Anesthesia Post-op Note  Patient: Brian Shere Bruington Sr.  Procedure(s) Performed: Procedure(s) (LRB): CYSTOSCOPY WITH BLADDER AND PROSTATE BIOPSY (N/A)  Patient Location: PACU  Anesthesia Type: General  Level of Consciousness: awake and alert   Airway and Oxygen Therapy: Patient Spontanous Breathing  Post-op Pain: mild  Post-op Assessment: Post-op Vital signs reviewed, Patient's Cardiovascular Status Stable, Respiratory Function Stable, Patent Airway and No signs of Nausea or vomiting  Last Vitals:  Filed Vitals:   07/15/15 1156  BP: 129/59  Pulse: 62  Temp: 36.7 C  Resp: 16    Post-op Vital Signs: stable   Complications: No apparent anesthesia complications

## 2015-07-15 NOTE — Anesthesia Procedure Notes (Signed)
Procedure Name: LMA Insertion Date/Time: 07/15/2015 9:00 AM Performed by: Bethena Roys T Pre-anesthesia Checklist: Patient identified, Emergency Drugs available, Suction available and Patient being monitored Patient Re-evaluated:Patient Re-evaluated prior to inductionOxygen Delivery Method: Circle System Utilized Preoxygenation: Pre-oxygenation with 100% oxygen Intubation Type: IV induction Ventilation: Mask ventilation without difficulty LMA: LMA inserted LMA Size: 5.0 Number of attempts: 1 Airway Equipment and Method: Bite block Placement Confirmation: positive ETCO2 Dental Injury: Teeth and Oropharynx as per pre-operative assessment

## 2015-07-18 ENCOUNTER — Encounter (HOSPITAL_BASED_OUTPATIENT_CLINIC_OR_DEPARTMENT_OTHER): Payer: Self-pay | Admitting: Urology

## 2015-07-22 DIAGNOSIS — M79674 Pain in right toe(s): Secondary | ICD-10-CM | POA: Diagnosis not present

## 2015-07-22 DIAGNOSIS — M79675 Pain in left toe(s): Secondary | ICD-10-CM | POA: Diagnosis not present

## 2015-07-29 DIAGNOSIS — N3941 Urge incontinence: Secondary | ICD-10-CM | POA: Diagnosis not present

## 2015-08-17 DIAGNOSIS — M5136 Other intervertebral disc degeneration, lumbar region: Secondary | ICD-10-CM | POA: Diagnosis not present

## 2015-09-08 NOTE — H&P (Signed)
Brian Boles Nuttle Sr. is an 74 y.o. male.    Chief Complaint: left shoulder pain and weakness  HPI: Pt is a 74 y.o. male complaining of left shoulder pain for multiple years. Pain had continually increased since the beginning. X-rays in the clinic show end-stage arthritic changes of the left shoulder. Pt has tried various conservative treatments which have failed to alleviate their symptoms, including injections and therapy. Various options are discussed with the patient. Risks, benefits and expectations were discussed with the patient. Patient understand the risks, benefits and expectations and wishes to proceed with surgery.   PCP:  Stephens Shire, MD  D/C Plans: Home  PMH: Past Medical History  Diagnosis Date  . Dyslipidemia     per pt questionable  . Allergic rhinitis   . Tingling     right arm;takes Gabapentin  . History of colon polyps   . Diverticulosis   . History of kidney stones     many yrs ago  . Glaucoma     BOTH EYES  . History of shingles   . OSA on CPAP     very severe per study 01-31-2006  . Chronic back pain   . GERD (gastroesophageal reflux disease)   . 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 History of gastritis   . History of diverticulitis of colon   . Bladder polyp   . ED (erectile dysfunction) of organic origin   . Peyronie's disease   . History of kidney stones   . Type 2 diabetes mellitus ([place])   . BPH (benign prostatic hypertrophy)   . Lower urinary tract symptoms (LUTS)   . Arthritis     SPINAL AND JOINTS  . S/P insertion of spinal cord stimulator     2010    PSH: Past Surgical History  Procedure Laterality Date  . Penile prosthesis implant  11-26-2007  . Colonoscopy    . Total knee arthroplasty Right 04/17/2013    Procedure: RIGHT TOTAL KNEE ARTHROPLASTY;  Surgeon: Augustin Schooling, MD;  Location: [place];  Service: Orthopedics;  Laterality: Right;  . Permanant spinal cord stimulator  implant via thoracic laminectomy  05-12-2009    Medtronic generator (left buttock)  . Right shoulder hemiarthroplasty  08-20-2008  . Revision total arthroplasty right shoulder  07-22-2009  . Total knee arthroplasty Left 02-10-2009  . Anterior cervical decomp/discectomy fusion  06-16-2004    C3 -- C5  . Posterior laminectomy / decompression cervical spine  03-08-2005    C3  -- C6  . Shoulder open rotator cuff repair Bilateral   . Nasal septum surgery  yrs ago  . Cataract extraction w/ intraocular lens  implant, bilateral    . Retinal detachment surgery Right   . Right heel surgery      spurs  . Appendectomy  age 42  . Inguinal hernia repair Bilateral 1970's  . Rotator cuff repair Bilateral x1 right/  x2  left - last one 2013  . Cystoscopy with biopsy N/A 07/15/2015    Procedure: CYSTOSCOPY WITH BLADDER AND PROSTATE BIOPSY;  Surgeon: Festus Aloe, MD;  Location: [facility];  Service: Urology;  Laterality: N/A;    Social History:  reports that he quit smoking about 44 years ago. His smoking use included Cigarettes. He has a 15 pack-year smoking history. He has never used smokeless tobacco. He reports that he drinks alcohol. He reports that he does not use  illicit drugs.  Allergies:  No Known Allergies  Medications: No current facility-administered medications for this encounter.   Current Outpatient Prescriptions  Medication Sig Dispense Refill  . aspirin 81 MG tablet Take 1 tablet (81 mg total) by mouth daily. 30 tablet 0  . brimonidine (ALPHAGAN P) 0.1 % SOLN Place 1 drop into both eyes 3 (three) times daily.    . cetirizine (ZYRTEC) 10 MG tablet Take 10 mg by mouth every morning.    . Cholecalciferol (VITAMIN D3) 2000 UNITS TABS Take 1 tablet by mouth daily.      . flunisolide (NASAREL) 29 MCG/ACT (0.025%) nasal spray Place 2 sprays into the nose as needed. Dose is for each nostril.     Marland Kitchen gabapentin (NEURONTIN) 600 MG tablet Take 600 mg by mouth 2 (two) times  daily.    . metFORMIN (GLUCOPHAGE) 500 MG tablet Take 500 mg by mouth every evening.     . Multiple Vitamin (MULTIVITAMIN) capsule Take 1 capsule by mouth daily.      . Omega-3 Fatty Acids (FISH OIL) 1200 MG CAPS Take 1 capsule by mouth daily.      Marland Kitchen omeprazole (PRILOSEC) 20 MG capsule Take 40 mg by mouth 2 (two) times daily.     . Potassium 99 MG TABS Take 1 tablet by mouth every morning.    Marland Kitchen rOPINIRole (REQUIP) 1 MG tablet Take 1 mg by mouth at bedtime.        No results found for this or any previous visit (from the past 48 hour(s)). No results found.  ROS: Pain with rom of the left upper extremity  Physical Exam:  Alert and oriented 74 y.o. male in no acute distress Cranial nerves 2-12 intact Cervical spine: full rom with no tenderness, nv intact distally Chest: active breath sounds bilaterally, no wheeze rhonchi or rales Heart: regular rate and rhythm, no murmur Abd: non tender non distended with active bowel sounds Hip is stable with rom  Left shoulder with painful rom  Moderate weakness and crepitus with rom nv intact distally No rashes  Assessment/Plan Assessment: left rotator cuff insufficiency   Plan: Patient will undergo a left reverse total shoulder by Dr. Veverly Fells at [facility]. Risks benefits and expectations were discussed with the patient. Patient understand risks, benefits and expectations and wishes to proceed.

## 2015-09-11 HISTORY — PX: HAND SURGERY: SHX662

## 2015-09-14 ENCOUNTER — Encounter (HOSPITAL_COMMUNITY): Payer: Self-pay

## 2015-09-14 ENCOUNTER — Encounter (HOSPITAL_COMMUNITY)
Admission: RE | Admit: 2015-09-14 | Discharge: 2015-09-14 | Disposition: A | Discharge status: Home / Self Care | Payer: Medicare Other | Source: Ambulatory Visit | Attending: Orthopedic Surgery | Admitting: Orthopedic Surgery

## 2015-09-14 DIAGNOSIS — Z96611 Presence of right artificial shoulder joint: Secondary | ICD-10-CM | POA: Insufficient documentation

## 2015-09-14 DIAGNOSIS — E119 Type 2 diabetes mellitus without complications: Secondary | ICD-10-CM | POA: Insufficient documentation

## 2015-09-14 DIAGNOSIS — K219 Gastro-esophageal reflux disease without esophagitis: Secondary | ICD-10-CM | POA: Insufficient documentation

## 2015-09-14 DIAGNOSIS — G4733 Obstructive sleep apnea (adult) (pediatric): Secondary | ICD-10-CM | POA: Insufficient documentation

## 2015-09-14 DIAGNOSIS — M75102 Unspecified rotator cuff tear or rupture of left shoulder, not specified as traumatic: Secondary | ICD-10-CM | POA: Insufficient documentation

## 2015-09-14 DIAGNOSIS — Z7984 Long term (current) use of oral hypoglycemic drugs: Secondary | ICD-10-CM | POA: Diagnosis not present

## 2015-09-14 DIAGNOSIS — Z96651 Presence of right artificial knee joint: Secondary | ICD-10-CM | POA: Diagnosis not present

## 2015-09-14 DIAGNOSIS — Z01818 Encounter for other preprocedural examination: Secondary | ICD-10-CM | POA: Diagnosis not present

## 2015-09-14 DIAGNOSIS — Z981 Arthrodesis status: Secondary | ICD-10-CM | POA: Diagnosis not present

## 2015-09-14 DIAGNOSIS — Z01812 Encounter for preprocedural laboratory examination: Secondary | ICD-10-CM | POA: Insufficient documentation

## 2015-09-14 DIAGNOSIS — Z79899 Other long term (current) drug therapy: Secondary | ICD-10-CM | POA: Diagnosis not present

## 2015-09-14 DIAGNOSIS — Z7982 Long term (current) use of aspirin: Secondary | ICD-10-CM | POA: Insufficient documentation

## 2015-09-14 DIAGNOSIS — E785 Hyperlipidemia, unspecified: Secondary | ICD-10-CM | POA: Diagnosis not present

## 2015-09-14 DIAGNOSIS — Z87891 Personal history of nicotine dependence: Secondary | ICD-10-CM | POA: Diagnosis not present

## 2015-09-14 DIAGNOSIS — H409 Unspecified glaucoma: Secondary | ICD-10-CM | POA: Diagnosis not present

## 2015-09-14 LAB — CBC
HEMATOCRIT: 42.7 % (ref 39.0–52.0)
HEMOGLOBIN: 13.8 g/dL (ref 13.0–17.0)
MCH: 27.9 pg (ref 26.0–34.0)
MCHC: 32.3 g/dL (ref 30.0–36.0)
MCV: 86.3 fL (ref 78.0–100.0)
Platelets: 185 10*3/uL (ref 150–400)
RBC: 4.95 MIL/uL (ref 4.22–5.81)
RDW: 13.9 % (ref 11.5–15.5)
WBC: 6.8 10*3/uL (ref 4.0–10.5)

## 2015-09-14 LAB — BASIC METABOLIC PANEL
Anion gap: 10 (ref 5–15)
BUN: 15 mg/dL (ref 6–20)
CALCIUM: 9.2 mg/dL (ref 8.9–10.3)
CO2: 23 mmol/L (ref 22–32)
Chloride: 109 mmol/L (ref 101–111)
Creatinine, Ser: 0.78 mg/dL (ref 0.61–1.24)
GFR calc Af Amer: >60 mL/min (ref >60)
GFR calc non Af Amer: >60 mL/min (ref >60)
GLUCOSE: 212 mg/dL — AB (ref 65–99)
POTASSIUM: 4.2 mmol/L (ref 3.5–5.1)
Sodium: 142 mmol/L (ref 135–145)

## 2015-09-14 LAB — SURGICAL PCR SCREEN
MRSA, PCR: NEGATIVE
Staphylococcus aureus: NEGATIVE

## 2015-09-14 LAB — GLUCOSE, CAPILLARY: Glucose-Capillary: 202 mg/dL — ABNORMAL HIGH (ref 65–99)

## 2015-09-14 NOTE — Pre-Procedure Instructions (Signed)
Brian Ekleberry Mathers Sr.  09/14/2015      [id number] - [place], [place] AT [facility] [address] [place] [address] Phone: [phone] Fax: [phone]    Your procedure is scheduled on : Friday, Jan 13  Report to [facility] Admitting at 8:30 A.M.  Call this number if you have problems the morning of surgery:  [phone]               Any questions prior to surgery call [phone] Monday-Friday 8am-4pm   Remember:  Do not eat food or drink liquids after midnight.  Take these medicines the morning of surgery with A SIP OF WATER :eye drops, zyrtec, nasal spray if needed, gabapentin,loratadine, omeprazole              Stop aspirin, don't take any advil, motrin, naproxen,  Fish oil , herbal medicine 1 week prior to surgery              How to Manage Your Diabetes Before Surgery   Why is it important to control my blood sugar before and after surgery?   Improving blood sugar levels before and after surgery helps healing and can limit problems.  A way of improving blood sugar control is eating a healthy diet by:  - Eating less sugar and carbohydrates  - Increasing activity/exercise  - Talk with your doctor about reaching your blood sugar goals  High blood sugars (greater than 180 mg/dL) can raise your risk of infections and slow down your recovery so you will need to focus on controlling your diabetes during the weeks before surgery.  Make sure that the doctor who takes care of your diabetes knows about your planned surgery including the date and location.  How do I manage my blood sugars before surgery?   Check your blood sugar at least 4 times a day, 2 days before surgery to make sure that they are not too high or low.   Check your blood sugar the morning of your surgery when you wake up and every 2               hours until you get to the Short-Stay unit.  If your blood sugar is less than 70  mg/dL, you will need to treat for low blood sugar by:  Treat a low blood sugar (less than 70 mg/dL) with 1/2 cup of clear juice (cranberry or apple), 4 glucose tablets, OR glucose gel.  Recheck blood sugar in 15 minutes after treatment (to make sure it is greater than 70 mg/dL).  If blood sugar is not greater than 70 mg/dL on re-check, call [phone] for further instructions.   Report your blood sugar to the Short-Stay nurse when you get to Short-Stay.  References:  [facility], 2007 "How to Manage your Diabetes Before and After Surgery".  What do I do about my diabetes medications?   Do not take oral diabetes medicines (pills) the morning of surgery.    Do not wear jewelry, make-up or nail polish.  Do not wear lotions, powders, or perfumes.  You may wear deodorant.  Do not shave 48 hours prior to surgery.  Men may shave face and neck.  Do not bring valuables to the hospital.  [facility] is not responsible for any belongings or valuables.  Contacts, dentures or bridgework may not be worn into surgery.  Leave your  suitcase in the car.  After surgery it may be brought to your room.  For patients admitted to the hospital, discharge time will be determined by your treatment team.  Patients discharged the day of surgery will not be allowed to drive home.   Special instructions:  Review preparing for surgery  Please read over the following fact sheets that you were given. Pain Booklet, Coughing and Deep Breathing, Total Joint Packet, MRSA Information and Surgical Site Infection Prevention

## 2015-09-14 NOTE — Progress Notes (Signed)
PCP: Dr. Standley Dakins at [facility] in [place]  Last sleep study 9-10 yrs. Ago Pt. Doesn't know what blood sugars run. States he doesn't check them at home.

## 2015-09-15 LAB — HEMOGLOBIN A1C
Hgb A1c MFr Bld: 9.2 % — ABNORMAL HIGH (ref 4.8–5.6)
MEAN PLASMA GLUCOSE: 217 mg/dL

## 2015-09-16 NOTE — Progress Notes (Addendum)
Anesthesia Chart Review: Patient is a 75 year old male scheduled for left reverse total shoulder arthroplasty on 09/23/15 by Dr. Veverly Fells.  History includes DM2, former smoker, dyslipidemia, glaucoma, OSA on CPAP (Dr. Gwenette Greet), GERD, neck injury (central cord syndrome after fall '05), s/p ACDF, BPH, ED s/p penile prosthesis, Peyronie's disease, chronic back pain, spinal cord stimulator '10, nephrolithiasis, right TKA '14, right total shoulder, prostate biopsy 07/15/15 (Dr. Junious Silk).  PCP is Dr. Standley Dakins at [facility] in [place] who signed a note of medical clearance with recommendation to "monitor blood sugars." Local no-[facility] PCP is also listed as Dr. Juanita Craver.    Meds include ASA 81 mg, Requip, potassium, Prilosec, fish oil, metformin, loratadine, gabapentin, Nasarel, Alphagan soln.   PAT Vitals: BP 136/47, HR 62, RR 20, T 36.7C, O2 sat 94%. CBG 202.  07/14/16 EKG: NSR.  Preoperative labs noted. A1c 9.2 consistent with a mean plasma glucose of 217. Unfortunately he dose not check his glucose results at home, so could not tell his PAT RN what his sugars typically run.  Lori at Dr. Veverly Fells' office notified of A1c results and that fasting glucose results not known because he does not check his levels at home. Discussed that a glucose > 200 on the day of surgery may cancel his procedure. She will review with Dr. Veverly Fells to determine how he would like to proceed.   George Hugh [facility] Short Stay Center/Anesthesiology Phone [phone] 09/16/2015 2:58 PM  Addendum: I spoke with Judeen Hammans at Dr. Veverly Fells' office. Dr. Veverly Fells and his PA Leroy Sea are aware of A1c results. They have communicated with patient. He is monitoring his glucose and diet. He is suppose to contact his PCP if glucose is significantly elevated. As of now, Dr. Veverly Fells plans for patient to have a fasting CBG on the morning of surgery and if fasting glucose acceptable than proceed as planned.  George Hugh [facility] Short Stay  Center/Anesthesiology Phone [phone] 09/20/2015 5:12 PM

## 2015-09-22 MED ORDER — CHLORHEXIDINE GLUCONATE 4 % EX LIQD: 60.0000 mL | Freq: Once | CUTANEOUS | Status: DC

## 2015-09-22 MED ORDER — CEFAZOLIN SODIUM-DEXTROSE 2-3 GM-% IV SOLR
2.0000 g | INTRAVENOUS | Status: AC
  Administered 2015-09-23: 2 g via INTRAVENOUS
  Filled 2015-09-22: qty 50

## 2015-09-23 ENCOUNTER — Inpatient Hospital Stay (HOSPITAL_COMMUNITY): Payer: Medicare Other

## 2015-09-23 ENCOUNTER — Encounter (HOSPITAL_COMMUNITY): Payer: Self-pay | Admitting: Certified Registered Nurse Anesthetist

## 2015-09-23 ENCOUNTER — Inpatient Hospital Stay (HOSPITAL_COMMUNITY): Payer: Medicare Other | Admitting: Emergency Medicine

## 2015-09-23 ENCOUNTER — Inpatient Hospital Stay (HOSPITAL_COMMUNITY)
Admission: RE | Admit: 2015-09-23 | Discharge: 2015-09-25 | DRG: 483 | Disposition: A | Discharge status: Home / Self Care | Payer: Medicare Other | Source: Ambulatory Visit | Attending: Orthopedic Surgery | Admitting: Orthopedic Surgery

## 2015-09-23 ENCOUNTER — Inpatient Hospital Stay (HOSPITAL_COMMUNITY): Payer: Medicare Other | Admitting: Anesthesiology

## 2015-09-23 ENCOUNTER — Encounter (HOSPITAL_COMMUNITY)
Admission: RE | Disposition: A | Discharge status: Home / Self Care | Payer: Self-pay | Source: Ambulatory Visit | Attending: Orthopedic Surgery

## 2015-09-23 DIAGNOSIS — N400 Enlarged prostate without lower urinary tract symptoms: Secondary | ICD-10-CM | POA: Diagnosis present

## 2015-09-23 DIAGNOSIS — H409 Unspecified glaucoma: Secondary | ICD-10-CM | POA: Diagnosis present

## 2015-09-23 DIAGNOSIS — G4733 Obstructive sleep apnea (adult) (pediatric): Secondary | ICD-10-CM | POA: Diagnosis present

## 2015-09-23 DIAGNOSIS — Z79899 Other long term (current) drug therapy: Secondary | ICD-10-CM | POA: Diagnosis not present

## 2015-09-23 DIAGNOSIS — Z7982 Long term (current) use of aspirin: Secondary | ICD-10-CM | POA: Diagnosis not present

## 2015-09-23 DIAGNOSIS — K219 Gastro-esophageal reflux disease without esophagitis: Secondary | ICD-10-CM | POA: Diagnosis present

## 2015-09-23 DIAGNOSIS — E785 Hyperlipidemia, unspecified: Secondary | ICD-10-CM | POA: Diagnosis present

## 2015-09-23 DIAGNOSIS — Z96653 Presence of artificial knee joint, bilateral: Secondary | ICD-10-CM | POA: Diagnosis present

## 2015-09-23 DIAGNOSIS — Z471 Aftercare following joint replacement surgery: Secondary | ICD-10-CM | POA: Diagnosis not present

## 2015-09-23 DIAGNOSIS — M549 Dorsalgia, unspecified: Secondary | ICD-10-CM | POA: Diagnosis present

## 2015-09-23 DIAGNOSIS — M25512 Pain in left shoulder: Secondary | ICD-10-CM | POA: Diagnosis not present

## 2015-09-23 DIAGNOSIS — J309 Allergic rhinitis, unspecified: Secondary | ICD-10-CM | POA: Diagnosis present

## 2015-09-23 DIAGNOSIS — G8929 Other chronic pain: Secondary | ICD-10-CM | POA: Diagnosis present

## 2015-09-23 DIAGNOSIS — Z96612 Presence of left artificial shoulder joint: Secondary | ICD-10-CM

## 2015-09-23 DIAGNOSIS — Z87891 Personal history of nicotine dependence: Secondary | ICD-10-CM | POA: Diagnosis not present

## 2015-09-23 DIAGNOSIS — M19012 Primary osteoarthritis, left shoulder: Secondary | ICD-10-CM | POA: Diagnosis not present

## 2015-09-23 DIAGNOSIS — M7502 Adhesive capsulitis of left shoulder: Secondary | ICD-10-CM | POA: Diagnosis not present

## 2015-09-23 DIAGNOSIS — Z981 Arthrodesis status: Secondary | ICD-10-CM | POA: Diagnosis not present

## 2015-09-23 DIAGNOSIS — Z96619 Presence of unspecified artificial shoulder joint: Secondary | ICD-10-CM

## 2015-09-23 DIAGNOSIS — M75102 Unspecified rotator cuff tear or rupture of left shoulder, not specified as traumatic: Secondary | ICD-10-CM | POA: Diagnosis not present

## 2015-09-23 DIAGNOSIS — G8918 Other acute postprocedural pain: Secondary | ICD-10-CM | POA: Diagnosis not present

## 2015-09-23 DIAGNOSIS — E119 Type 2 diabetes mellitus without complications: Secondary | ICD-10-CM | POA: Diagnosis not present

## 2015-09-23 DIAGNOSIS — Z7984 Long term (current) use of oral hypoglycemic drugs: Secondary | ICD-10-CM | POA: Diagnosis not present

## 2015-09-23 HISTORY — PX: REVERSE SHOULDER ARTHROPLASTY: SHX5054

## 2015-09-23 LAB — GLUCOSE, CAPILLARY
GLUCOSE-CAPILLARY: 179 mg/dL — AB (ref 65–99)
GLUCOSE-CAPILLARY: 166 mg/dL — AB (ref 65–99)
Glucose-Capillary: 167 mg/dL — ABNORMAL HIGH (ref 65–99)
Glucose-Capillary: 175 mg/dL — ABNORMAL HIGH (ref 65–99)

## 2015-09-23 SURGERY — ARTHROPLASTY, SHOULDER, TOTAL, REVERSE
Anesthesia: General | Site: Shoulder | Laterality: Left

## 2015-09-23 MED ORDER — ACETAMINOPHEN 650 MG RE SUPP: 650.0000 mg | Freq: Four times a day (QID) | RECTAL | Status: DC | PRN

## 2015-09-23 MED ORDER — PROMETHAZINE HCL 25 MG/ML IJ SOLN: 6.2500 mg | INTRAMUSCULAR | Status: DC | PRN

## 2015-09-23 MED ORDER — OXYCODONE HCL 5 MG PO TABS
ORAL_TABLET | ORAL | Status: AC
  Filled 2015-09-23: qty 1

## 2015-09-23 MED ORDER — CEFAZOLIN SODIUM-DEXTROSE 2-3 GM-% IV SOLR
2.0000 g | Freq: Four times a day (QID) | INTRAVENOUS | Status: AC
  Administered 2015-09-23 – 2015-09-24 (×3): 2 g via INTRAVENOUS
  Filled 2015-09-23 (×3): qty 50

## 2015-09-23 MED ORDER — OMEGA-3-ACID ETHYL ESTERS 1 G PO CAPS
2.0000 g | ORAL_CAPSULE | Freq: Every day | ORAL | Status: DC
  Administered 2015-09-24 – 2015-09-25 (×2): 2 g via ORAL
  Filled 2015-09-23 (×2): qty 2

## 2015-09-23 MED ORDER — MEPERIDINE HCL 25 MG/ML IJ SOLN: 6.2500 mg | INTRAMUSCULAR | Status: DC | PRN

## 2015-09-23 MED ORDER — OXYCODONE HCL 5 MG PO TABS
5.0000 mg | ORAL_TABLET | ORAL | Status: DC | PRN
  Administered 2015-09-23 (×2): 5 mg via ORAL
  Administered 2015-09-23 – 2015-09-24 (×3): 10 mg via ORAL
  Administered 2015-09-24: 5 mg via ORAL
  Administered 2015-09-24 – 2015-09-25 (×6): 10 mg via ORAL
  Filled 2015-09-23 (×10): qty 2

## 2015-09-23 MED ORDER — ONDANSETRON HCL 4 MG/2ML IJ SOLN
INTRAMUSCULAR | Status: DC | PRN
  Administered 2015-09-23: 4 mg via INTRAVENOUS

## 2015-09-23 MED ORDER — ONDANSETRON HCL 4 MG/2ML IJ SOLN: 4.0000 mg | Freq: Four times a day (QID) | INTRAMUSCULAR | Status: DC | PRN

## 2015-09-23 MED ORDER — PANTOPRAZOLE SODIUM 40 MG PO TBEC
40.0000 mg | DELAYED_RELEASE_TABLET | Freq: Every day | ORAL | Status: DC
  Administered 2015-09-24 – 2015-09-25 (×2): 40 mg via ORAL
  Filled 2015-09-23 (×2): qty 1

## 2015-09-23 MED ORDER — PHENYLEPHRINE HCL 10 MG/ML IJ SOLN
10.0000 mg | INTRAMUSCULAR | Status: DC | PRN
  Administered 2015-09-23: 20 ug/min via INTRAVENOUS

## 2015-09-23 MED ORDER — DOCUSATE SODIUM 100 MG PO CAPS
100.0000 mg | ORAL_CAPSULE | Freq: Two times a day (BID) | ORAL | Status: DC
  Administered 2015-09-23 – 2015-09-25 (×4): 100 mg via ORAL
  Filled 2015-09-23 (×4): qty 1

## 2015-09-23 MED ORDER — METOCLOPRAMIDE HCL 5 MG PO TABS: 5.0000 mg | ORAL_TABLET | Freq: Three times a day (TID) | ORAL | Status: DC | PRN

## 2015-09-23 MED ORDER — FLUTICASONE PROPIONATE 50 MCG/ACT NA SUSP
2.0000 | Freq: Every day | NASAL | Status: DC
  Administered 2015-09-24 – 2015-09-25 (×2): 2 via NASAL
  Filled 2015-09-23: qty 16

## 2015-09-23 MED ORDER — FENTANYL CITRATE (PF) 100 MCG/2ML IJ SOLN
50.0000 ug | Freq: Once | INTRAMUSCULAR | Status: AC
  Administered 2015-09-23: 50 ug via INTRAVENOUS

## 2015-09-23 MED ORDER — ONDANSETRON HCL 4 MG/2ML IJ SOLN
INTRAMUSCULAR | Status: AC
  Filled 2015-09-23: qty 2

## 2015-09-23 MED ORDER — POLYETHYLENE GLYCOL 3350 17 G PO PACK: 17.0000 g | PACK | Freq: Every day | ORAL | Status: DC | PRN

## 2015-09-23 MED ORDER — OXYCODONE-ACETAMINOPHEN 7.5-325 MG PO TABS: 1.0000 | ORAL_TABLET | ORAL | Status: DC | PRN

## 2015-09-23 MED ORDER — MIDAZOLAM HCL 2 MG/2ML IJ SOLN
INTRAMUSCULAR | Status: AC
Start: 2015-09-23 — End: 2015-09-23
  Filled 2015-09-23: qty 2

## 2015-09-23 MED ORDER — ROPINIROLE HCL 1 MG PO TABS
1.0000 mg | ORAL_TABLET | Freq: Every day | ORAL | Status: DC
  Administered 2015-09-23 – 2015-09-24 (×2): 1 mg via ORAL
  Filled 2015-09-23 (×2): qty 1

## 2015-09-23 MED ORDER — FENTANYL CITRATE (PF) 100 MCG/2ML IJ SOLN
INTRAMUSCULAR | Status: AC
  Filled 2015-09-23: qty 2

## 2015-09-23 MED ORDER — HYDROMORPHONE HCL 1 MG/ML IJ SOLN
INTRAMUSCULAR | Status: AC
  Filled 2015-09-23: qty 1

## 2015-09-23 MED ORDER — LACTATED RINGERS IV SOLN
INTRAVENOUS | Status: DC
  Administered 2015-09-23: 09:00:00 via INTRAVENOUS

## 2015-09-23 MED ORDER — FENTANYL CITRATE (PF) 100 MCG/2ML IJ SOLN
INTRAMUSCULAR | Status: DC | PRN
  Administered 2015-09-23: 50 ug via INTRAVENOUS

## 2015-09-23 MED ORDER — ADULT MULTIVITAMIN W/MINERALS CH
1.0000 | ORAL_TABLET | Freq: Every day | ORAL | Status: DC
  Administered 2015-09-24 – 2015-09-25 (×2): 1 via ORAL
  Filled 2015-09-23 (×2): qty 1

## 2015-09-23 MED ORDER — FENTANYL CITRATE (PF) 100 MCG/2ML IJ SOLN
INTRAMUSCULAR | Status: AC
  Administered 2015-09-23: 25 ug via INTRAVENOUS
  Filled 2015-09-23: qty 2

## 2015-09-23 MED ORDER — PROPOFOL 10 MG/ML IV BOLUS
INTRAVENOUS | Status: AC
  Filled 2015-09-23: qty 20

## 2015-09-23 MED ORDER — METOCLOPRAMIDE HCL 5 MG/ML IJ SOLN: 5.0000 mg | Freq: Three times a day (TID) | INTRAMUSCULAR | Status: DC | PRN

## 2015-09-23 MED ORDER — HYDROMORPHONE HCL 1 MG/ML IJ SOLN
1.0000 mg | INTRAMUSCULAR | Status: DC | PRN
  Administered 2015-09-23 – 2015-09-24 (×4): 1 mg via INTRAVENOUS
  Filled 2015-09-23: qty 2
  Filled 2015-09-23 (×2): qty 1

## 2015-09-23 MED ORDER — INSULIN ASPART 100 UNIT/ML ~~LOC~~ SOLN
0.0000 [IU] | Freq: Three times a day (TID) | SUBCUTANEOUS | Status: DC
  Administered 2015-09-23 – 2015-09-25 (×4): 4 [IU] via SUBCUTANEOUS

## 2015-09-23 MED ORDER — METHOCARBAMOL 1000 MG/10ML IJ SOLN
500.0000 mg | INTRAVENOUS | Status: AC
  Administered 2015-09-23: 500 mg via INTRAVENOUS
  Filled 2015-09-23: qty 5

## 2015-09-23 MED ORDER — VITAMIN D 1000 UNITS PO TABS
2000.0000 [IU] | ORAL_TABLET | Freq: Every day | ORAL | Status: DC
  Administered 2015-09-23 – 2015-09-25 (×3): 2000 [IU] via ORAL
  Filled 2015-09-23 (×3): qty 2

## 2015-09-23 MED ORDER — METHOCARBAMOL 500 MG PO TABS
500.0000 mg | ORAL_TABLET | Freq: Four times a day (QID) | ORAL | Status: DC | PRN
  Administered 2015-09-25: 500 mg via ORAL
  Filled 2015-09-23: qty 1

## 2015-09-23 MED ORDER — METHOCARBAMOL 1000 MG/10ML IJ SOLN: 500.0000 mg | Freq: Four times a day (QID) | INTRAVENOUS | Status: DC | PRN

## 2015-09-23 MED ORDER — SUCCINYLCHOLINE CHLORIDE 20 MG/ML IJ SOLN
INTRAMUSCULAR | Status: DC | PRN
  Administered 2015-09-23: 100 mg via INTRAVENOUS

## 2015-09-23 MED ORDER — INSULIN ASPART 100 UNIT/ML ~~LOC~~ SOLN
6.0000 [IU] | Freq: Three times a day (TID) | SUBCUTANEOUS | Status: DC
  Administered 2015-09-23 – 2015-09-25 (×4): 6 [IU] via SUBCUTANEOUS

## 2015-09-23 MED ORDER — ASPIRIN 81 MG PO CHEW
81.0000 mg | CHEWABLE_TABLET | Freq: Every day | ORAL | Status: DC
  Administered 2015-09-23 – 2015-09-25 (×3): 81 mg via ORAL
  Filled 2015-09-23 (×3): qty 1

## 2015-09-23 MED ORDER — SODIUM CHLORIDE 0.9 % IV SOLN
INTRAVENOUS | Status: DC
  Administered 2015-09-24: 04:00:00 via INTRAVENOUS

## 2015-09-23 MED ORDER — BRIMONIDINE TARTRATE 0.2 % OP SOLN
1.0000 [drp] | Freq: Three times a day (TID) | OPHTHALMIC | Status: DC
  Administered 2015-09-23 – 2015-09-25 (×5): 1 [drp] via OPHTHALMIC
  Filled 2015-09-23: qty 5

## 2015-09-23 MED ORDER — LACTATED RINGERS IV SOLN: INTRAVENOUS | Status: DC

## 2015-09-23 MED ORDER — LORATADINE 10 MG PO TABS
10.0000 mg | ORAL_TABLET | Freq: Every day | ORAL | Status: DC
  Administered 2015-09-23 – 2015-09-25 (×3): 10 mg via ORAL
  Filled 2015-09-23 (×3): qty 1

## 2015-09-23 MED ORDER — ONDANSETRON HCL 4 MG PO TABS: 4.0000 mg | ORAL_TABLET | Freq: Four times a day (QID) | ORAL | Status: DC | PRN

## 2015-09-23 MED ORDER — PHENOL 1.4 % MT LIQD: 1.0000 | OROMUCOSAL | Status: DC | PRN

## 2015-09-23 MED ORDER — MENTHOL 3 MG MT LOZG: 1.0000 | LOZENGE | OROMUCOSAL | Status: DC | PRN

## 2015-09-23 MED ORDER — METHOCARBAMOL 500 MG PO TABS: 500.0000 mg | ORAL_TABLET | Freq: Three times a day (TID) | ORAL | Status: DC | PRN

## 2015-09-23 MED ORDER — SODIUM CHLORIDE 0.9 % IR SOLN
Status: DC | PRN
  Administered 2015-09-23: 1000 mL

## 2015-09-23 MED ORDER — INSULIN ASPART 100 UNIT/ML ~~LOC~~ SOLN: 0.0000 [IU] | Freq: Every day | SUBCUTANEOUS | Status: DC

## 2015-09-23 MED ORDER — METFORMIN HCL 500 MG PO TABS
500.0000 mg | ORAL_TABLET | Freq: Three times a day (TID) | ORAL | Status: DC
  Administered 2015-09-23 – 2015-09-25 (×4): 500 mg via ORAL
  Filled 2015-09-23 (×4): qty 1

## 2015-09-23 MED ORDER — FENTANYL CITRATE (PF) 100 MCG/2ML IJ SOLN
25.0000 ug | INTRAMUSCULAR | Status: DC | PRN
  Administered 2015-09-23 (×4): 25 ug via INTRAVENOUS

## 2015-09-23 MED ORDER — GABAPENTIN 300 MG PO CAPS
600.0000 mg | ORAL_CAPSULE | Freq: Two times a day (BID) | ORAL | Status: DC
  Administered 2015-09-23 – 2015-09-25 (×4): 600 mg via ORAL
  Filled 2015-09-23 (×4): qty 2

## 2015-09-23 MED ORDER — FENTANYL CITRATE (PF) 250 MCG/5ML IJ SOLN
INTRAMUSCULAR | Status: AC
  Filled 2015-09-23: qty 5

## 2015-09-23 MED ORDER — ACETAMINOPHEN 325 MG PO TABS
650.0000 mg | ORAL_TABLET | Freq: Four times a day (QID) | ORAL | Status: DC | PRN
  Administered 2015-09-24: 650 mg via ORAL
  Filled 2015-09-23: qty 2

## 2015-09-23 MED ORDER — BUPIVACAINE-EPINEPHRINE 0.25% -1:200000 IJ SOLN
INTRAMUSCULAR | Status: DC | PRN
  Administered 2015-09-23: 10 mL

## 2015-09-23 MED ORDER — PROPOFOL 10 MG/ML IV BOLUS
INTRAVENOUS | Status: DC | PRN
  Administered 2015-09-23: 150 mg via INTRAVENOUS

## 2015-09-23 MED ORDER — LACTATED RINGERS IV SOLN
INTRAVENOUS | Status: DC | PRN
  Administered 2015-09-23 (×2): via INTRAVENOUS

## 2015-09-23 MED ORDER — LIDOCAINE HCL (CARDIAC) 20 MG/ML IV SOLN
INTRAVENOUS | Status: DC | PRN
  Administered 2015-09-23: 60 mg via INTRAVENOUS

## 2015-09-23 MED ORDER — BUPIVACAINE-EPINEPHRINE (PF) 0.25% -1:200000 IJ SOLN
INTRAMUSCULAR | Status: AC
  Filled 2015-09-23: qty 30

## 2015-09-23 MED ORDER — LIDOCAINE HCL (CARDIAC) 20 MG/ML IV SOLN
INTRAVENOUS | Status: AC
  Filled 2015-09-23: qty 5

## 2015-09-23 MED ORDER — BISACODYL 10 MG RE SUPP: 10.0000 mg | Freq: Every day | RECTAL | Status: DC | PRN

## 2015-09-23 MED ORDER — POTASSIUM 99 MG PO TABS: 1.0000 | ORAL_TABLET | Freq: Every morning | ORAL | Status: DC

## 2015-09-23 SURGICAL SUPPLY — 69 items
BIT DRILL 170X2.5X (BIT) ×1 IMPLANT
BIT DRILL 5/64X5 DISP (BIT) ×2 IMPLANT
BIT DRL 170X2.5X (BIT) ×1
BLADE SAG 18X100X1.27 (BLADE) ×2 IMPLANT
CAPT SHLDR REVTOTAL 1 ×2 IMPLANT
COVER SURGICAL LIGHT HANDLE (MISCELLANEOUS) ×2 IMPLANT
DRAPE IMP U-DRAPE 54X76 (DRAPES) IMPLANT
DRAPE INCISE IOBAN 66X45 STRL (DRAPES) ×2 IMPLANT
DRAPE ORTHO SPLIT 77X108 STRL (DRAPES) ×2
DRAPE PROXIMA HALF (DRAPES) ×2 IMPLANT
DRAPE SURG ORHT 6 SPLT 77X108 (DRAPES) ×2 IMPLANT
DRAPE U-SHAPE 47X51 STRL (DRAPES) ×2 IMPLANT
DRAPE X-RAY CASS 24X20 (DRAPES) IMPLANT
DRILL 2.5 (BIT) ×1
DRSG ADAPTIC 3X8 NADH LF (GAUZE/BANDAGES/DRESSINGS) ×2 IMPLANT
DRSG PAD ABDOMINAL 8X10 ST (GAUZE/BANDAGES/DRESSINGS) ×2 IMPLANT
DURAPREP 26ML APPLICATOR (WOUND CARE) ×2 IMPLANT
ELECT BLADE 4.0 EZ CLEAN MEGAD (MISCELLANEOUS) ×2
ELECT NEEDLE TIP 2.8 STRL (NEEDLE) ×2 IMPLANT
ELECT REM PT RETURN 9FT ADLT (ELECTROSURGICAL) ×2
ELECTRODE BLDE 4.0 EZ CLN MEGD (MISCELLANEOUS) ×1 IMPLANT
ELECTRODE REM PT RTRN 9FT ADLT (ELECTROSURGICAL) ×1 IMPLANT
GAUZE SPONGE 4X4 12PLY STRL (GAUZE/BANDAGES/DRESSINGS) ×2 IMPLANT
GLOVE BIOGEL PI IND STRL 6.5 (GLOVE) ×1 IMPLANT
GLOVE BIOGEL PI INDICATOR 6.5 (GLOVE) ×1
GLOVE BIOGEL PI ORTHO PRO 7.5 (GLOVE) ×1
GLOVE BIOGEL PI ORTHO PRO SZ8 (GLOVE) ×1
GLOVE ORTHO TXT STRL SZ7.5 (GLOVE) ×2 IMPLANT
GLOVE PI ORTHO PRO STRL 7.5 (GLOVE) ×1 IMPLANT
GLOVE PI ORTHO PRO STRL SZ8 (GLOVE) ×1 IMPLANT
GLOVE SURG ORTHO 8.5 STRL (GLOVE) ×2 IMPLANT
GOWN STRL REUS W/ TWL LRG LVL3 (GOWN DISPOSABLE) ×1 IMPLANT
GOWN STRL REUS W/ TWL XL LVL3 (GOWN DISPOSABLE) ×2 IMPLANT
GOWN STRL REUS W/TWL LRG LVL3 (GOWN DISPOSABLE) ×1
GOWN STRL REUS W/TWL XL LVL3 (GOWN DISPOSABLE) ×2
HANDPIECE INTERPULSE COAX TIP (DISPOSABLE)
KIT BASIN OR (CUSTOM PROCEDURE TRAY) ×2 IMPLANT
KIT ROOM TURNOVER OR (KITS) ×2 IMPLANT
MANIFOLD NEPTUNE II (INSTRUMENTS) ×2 IMPLANT
NEEDLE 1/2 CIR MAYO (NEEDLE) ×2 IMPLANT
NEEDLE HYPO 25GX1X1/2 BEV (NEEDLE) ×2 IMPLANT
NS IRRIG 1000ML POUR BTL (IV SOLUTION) ×2 IMPLANT
PACK SHOULDER (CUSTOM PROCEDURE TRAY) ×2 IMPLANT
PAD ARMBOARD 7.5X6 YLW CONV (MISCELLANEOUS) ×4 IMPLANT
PIN GUIDE 1.2 (PIN) ×2 IMPLANT
PIN GUIDE GLENOPHERE 1.5MX300M (PIN) ×2 IMPLANT
PIN METAGLENE 2.5 (PIN) ×4 IMPLANT
SET HNDPC FAN SPRY TIP SCT (DISPOSABLE) IMPLANT
SLING ARM LRG ADULT FOAM STRAP (SOFTGOODS) ×2 IMPLANT
SLING ARM MED ADULT FOAM STRAP (SOFTGOODS) IMPLANT
SPONGE LAP 18X18 X RAY DECT (DISPOSABLE) IMPLANT
SPONGE LAP 4X18 X RAY DECT (DISPOSABLE) ×2 IMPLANT
STRIP CLOSURE SKIN 1/2X4 (GAUZE/BANDAGES/DRESSINGS) ×2 IMPLANT
SUCTION FRAZIER TIP 10 FR DISP (SUCTIONS) ×2 IMPLANT
SUT FIBERWIRE #2 38 T-5 BLUE (SUTURE) ×4
SUT MNCRL AB 4-0 PS2 18 (SUTURE) ×2 IMPLANT
SUT VIC AB 2-0 CT1 27 (SUTURE) ×1
SUT VIC AB 2-0 CT1 TAPERPNT 27 (SUTURE) ×1 IMPLANT
SUT VICRYL 0 CT 1 36IN (SUTURE) ×2 IMPLANT
SUTURE FIBERWR #2 38 T-5 BLUE (SUTURE) ×2 IMPLANT
SYR CONTROL 10ML LL (SYRINGE) ×2 IMPLANT
TAPE CLOTH SURG 6X10 WHT LF (GAUZE/BANDAGES/DRESSINGS) ×2 IMPLANT
TOWEL OR 17X24 6PK STRL BLUE (TOWEL DISPOSABLE) ×2 IMPLANT
TOWEL OR 17X26 10 PK STRL BLUE (TOWEL DISPOSABLE) ×2 IMPLANT
TOWER CARTRIDGE SMART MIX (DISPOSABLE) IMPLANT
TRAY FOLEY CATH 16FRSI W/METER (SET/KITS/TRAYS/PACK) IMPLANT
TUBE CONNECTING 12X1/4 (SUCTIONS) ×2 IMPLANT
WATER STERILE IRR 1000ML POUR (IV SOLUTION) ×2 IMPLANT
YANKAUER SUCT BULB TIP NO VENT (SUCTIONS) ×4 IMPLANT

## 2015-09-23 NOTE — Discharge Instructions (Signed)
Ice to the shoulder as much as you can.  Keep the shoulder incision clean and dry and covered for one week, then ok to shower.  Use sling for comfort, may remove as you need to.  Keep pillow or blankets propped behind the elbow to keep the arm across the waist.  Ok to use the left arm for gentle activity of daily living.  Do not push up out of a chair or lift heavy with the left arm.  Follow up in the office in two weeks.  [phone]

## 2015-09-23 NOTE — Anesthesia Postprocedure Evaluation (Signed)
Anesthesia Post Note  Patient: Brian Heintzelman Salvo Sr.  Procedure(s) Performed: Procedure(s) (LRB): LEFT REVERSE TOTAL SHOULDER ARTHROPLASTY (Left)  Patient location during evaluation: PACU Anesthesia Type: General and Regional Level of consciousness: awake and alert Pain management: pain level controlled Vital Signs Assessment: post-procedure vital signs reviewed and stable Respiratory status: spontaneous breathing, nonlabored ventilation, respiratory function stable and patient connected to nasal cannula oxygen Cardiovascular status: blood pressure returned to baseline and stable Postop Assessment: no signs of nausea or vomiting Anesthetic complications: no    Last Vitals:  Filed Vitals:   09/23/15 1345 09/23/15 1346  BP:  122/67  Pulse:  65  Temp: 36.1 C   Resp:  23    Last Pain:  Filed Vitals:   09/23/15 1356  PainSc: 7                  Effie Berkshire

## 2015-09-23 NOTE — Interval H&P Note (Signed)
History and Physical Interval Note:  09/23/2015 9:19 AM  Brian Moores E Mcveigh Sr.  has presented today for surgery, with the diagnosis of left shoulder rotator cuff insufficiency  The various methods of treatment have been discussed with the patient and family. After consideration of risks, benefits and other options for treatment, the patient has consented to  Procedure(s): LEFT REVERSE TOTAL SHOULDER ARTHROPLASTY (Left) as a surgical intervention .  The patient's history has been reviewed, patient examined, no change in status, stable for surgery.  I have reviewed the patient's chart and labs.  Questions were answered to the patient's satisfaction.     Zyeir Dymek,STEVEN R

## 2015-09-23 NOTE — Progress Notes (Signed)
Utilization review completed.  

## 2015-09-23 NOTE — Anesthesia Procedure Notes (Addendum)
Anesthesia Regional Block:  Interscalene brachial plexus block  Pre-Anesthetic Checklist: ,, timeout performed, Correct Patient, Correct Site, Correct Laterality, Correct Procedure, Correct Position, site marked, Risks and benefits discussed,  Surgical consent,  Pre-op evaluation,  At surgeon's request and post-op pain management  Laterality: Left  Prep: chloraprep       Needles:  Injection technique: Single-shot  Needle Type: Echogenic Needle     Needle Length: 9cm 9 cm Needle Gauge: 21 and 21 G    Additional Needles:  Procedures: ultrasound guided (picture in chart) Interscalene brachial plexus block Narrative:  Start time: 09/23/2015 9:35 AM End time: 09/23/2015 9:38 AM Injection made incrementally with aspirations every 5 mL.  Performed by: Personally  Anesthesiologist: Suella Broad D  Additional Notes: No immediate complications noted.    Procedure Name: Intubation Date/Time: 09/23/2015 10:04 AM Performed by: Garrison Columbus T Pre-anesthesia Checklist: Patient identified, Emergency Drugs available, Suction available and Patient being monitored Patient Re-evaluated:Patient Re-evaluated prior to inductionOxygen Delivery Method: Circle system utilized Preoxygenation: Pre-oxygenation with 100% oxygen Intubation Type: IV induction Ventilation: Mask ventilation without difficulty and Oral airway inserted - appropriate to patient size Laryngoscope Size: Sabra Heck and 2 Grade View: Grade I Tube type: Oral Tube size: 7.5 mm Number of attempts: 1 Airway Equipment and Method: Stylet and Oral airway Placement Confirmation: ETT inserted through vocal cords under direct vision,  positive ETCO2 and breath sounds checked- equal and bilateral Secured at: 22 cm Tube secured with: Tape Dental Injury: Teeth and Oropharynx as per pre-operative assessment

## 2015-09-23 NOTE — Transfer of Care (Signed)
Immediate Anesthesia Transfer of Care Note  Patient: Brian Bussell Penalver Sr.  Procedure(s) Performed: Procedure(s): LEFT REVERSE TOTAL SHOULDER ARTHROPLASTY (Left)  Patient Location: PACU  Anesthesia Type:General and Regional  Level of Consciousness: awake, alert  and oriented  Airway & Oxygen Therapy: Patient Spontanous Breathing and Patient connected to nasal cannula oxygen  Post-op Assessment: Report given to RN, Post -op Vital signs reviewed and stable and Patient moving all extremities X 4  Post vital signs: Reviewed and stable  Last Vitals:  Filed Vitals:   09/23/15 0945 09/23/15 0950  BP:  117/62  Pulse: 69 79  Temp:    Resp: 20 23    Complications: No apparent anesthesia complications

## 2015-09-23 NOTE — Anesthesia Preprocedure Evaluation (Addendum)
Anesthesia Evaluation  Patient identified by MRN, date of birth, ID band Patient awake    Reviewed: Allergy & Precautions, NPO status , Patient's Chart, lab work & pertinent test results  Airway Mallampati: I  TM Distance: >3 FB Neck ROM: Full    Dental  (+) Dental Advisory Given, Teeth Intact   Pulmonary sleep apnea and Continuous Positive Airway Pressure Ventilation , former smoker,    breath sounds clear to auscultation       Cardiovascular negative cardio ROS   Rhythm:Regular Rate:Normal     Neuro/Psych  Neuromuscular disease negative psych ROS   GI/Hepatic Neg liver ROS, GERD  Medicated,  Endo/Other  diabetes, Type 2, Oral Hypoglycemic AgentsMorbid obesity  Renal/GU negative Renal ROS  negative genitourinary   Musculoskeletal  (+) Arthritis , Osteoarthritis,    Abdominal   Peds negative pediatric ROS (+)  Hematology negative hematology ROS (+)   Anesthesia Other Findings - HLD -   Reproductive/Obstetrics negative OB ROS                           Lab Results  Component Value Date   WBC 6.8 09/14/2015   HGB 13.8 09/14/2015   HCT 42.7 09/14/2015   MCV 86.3 09/14/2015   PLT 185 09/14/2015   Lab Results  Component Value Date   CREATININE 0.78 09/14/2015   BUN 15 09/14/2015   NA 142 09/14/2015   K 4.2 09/14/2015   CL 109 09/14/2015   CO2 23 09/14/2015   Lab Results  Component Value Date   INR 1.48 04/21/2013   INR 1.39 04/20/2013   INR 1.24 04/19/2013   07/2015 EKG: normal EKG, normal sinus rhythm.    Anesthesia Physical Anesthesia Plan  ASA: III  Anesthesia Plan: General   Post-op Pain Management: GA combined w/ Regional for post-op pain   Induction: Intravenous  Airway Management Planned: Oral ETT  Additional Equipment:   Intra-op Plan:   Post-operative Plan: Extubation in OR  Informed Consent: I have reviewed the patients History and Physical, chart,  labs and discussed the procedure including the risks, benefits and alternatives for the proposed anesthesia with the patient or authorized representative who has indicated his/her understanding and acceptance.   Dental advisory given  Plan Discussed with: CRNA  Anesthesia Plan Comments:         Anesthesia Quick Evaluation

## 2015-09-23 NOTE — Brief Op Note (Signed)
09/23/2015  12:18 PM  PATIENT:  Brian Crazier Hibberd Sr.  75 y.o. male  PRE-OPERATIVE DIAGNOSIS:  left shoulder rotator cuff insufficiency, intractable shoulder pain  POST-OPERATIVE DIAGNOSIS:  left shoulder rotator cuff insufficiency, shoulder pain, severe scarring   PROCEDURE:  Procedure(s): LEFT REVERSE TOTAL SHOULDER ARTHROPLASTY (Left) DePuy Delta Xtend  SURGEON:  Surgeon(s) and Role:    * Netta Cedars, MD - Primary  PHYSICIAN ASSISTANT:   ASSISTANTS: Ventura Bruns, PA-C   ANESTHESIA:   regional and general  EBL:  Total I/O In: 1000 [I.V.:1000] Out: -   BLOOD ADMINISTERED:none  DRAINS: none   LOCAL MEDICATIONS USED:  MARCAINE     SPECIMEN:  No Specimen  DISPOSITION OF SPECIMEN:  none  COUNTS:  YES  TOURNIQUET:  * No tourniquets in log *  DICTATION: .other dictation # [id number]  PLAN OF CARE: Admit to inpatient   PATIENT DISPOSITION:  PACU - hemodynamically stable.   Delay start of Pharmacological VTE agent (>24hrs) due to surgical blood loss or risk of bleeding: no

## 2015-09-23 NOTE — Op Note (Signed)
NAME:  Brian Ellison, Brian Ellison                ACCOUNT NO.:  [id number]  MEDICAL RECORD NO.:  [id number]  LOCATION:  [facility]                         FACILITY:  [facility]  PHYSICIAN:  Doran Heater. Veverly Fells, M.D. DATE OF BIRTH:  [birth year]  DATE OF PROCEDURE:  09/23/2015 DATE OF DISCHARGE:                              OPERATIVE REPORT   PREOPERATIVE DIAGNOSIS:  Left shoulder pain and rotator cuff insufficiency.  POSTOPERATIVE DIAGNOSES:  Left shoulder pain and rotator cuff insufficiency as well as left shoulder severe frozen shoulder and extensive scar tissue.  PROCEDURE PERFORMED:  Left reverse total shoulder arthroplasty using DePuy Delta Xtend prosthesis.  ATTENDING SURGEON:  Doran Heater. Veverly Fells, MD.  ASSISTANT:  Charletta Cousin Dixon, [place], who has scrubbed the entire procedure and necessary for satisfactory completion of surgery.  ANESTHESIA:  General anesthesia was used plus interscalene block.  ESTIMATED BLOOD LOSS:  100 mL.  FLUID REPLACEMENT:  1200 mL crystalloid.  INSTRUMENT COUNTS:  Correct.  COMPLICATIONS:  There were no complications.  ANTIBIOTICS:  Perioperative antibiotics were given.  INDICATIONS:  The patient is a 75 year old male, with worsening left shoulder pain and dysfunction following rotator cuff repair.  The patient suffered postoperative stiffness and now has a shoulder that really is fairly functionless and has intractable pain.  The patient has loss of fixed fulcrum mechanics, extensive stiffness noted throughout all planes of motion and really is unable to perform ADLs with his shoulder.  Discussed options of management with the patient, recommending a reverse total shoulder arthroplasty, which would address range of motion issues and provided excellent pain relief and some restoration of function.  Risks and benefits of the surgery was discussed.  Informed consent obtained.  DESCRIPTION OF PROCEDURE:  After an adequate level of anesthesia achieved, the patient was  positioned in the modified beach-chair position.  Left shoulder correctly identified and sterilely prepped and draped in the usual manner.  Time-out was called.  We initiated surgery using standard sterile prep and drape and a time-out using a standard deltopectoral incision, started at the coracoid process, extending down to the anterior humerus, dissected out through subcutaneous tissues using needle-tip Bovie.  Identified the cephalic vein and took it laterally to the deltoid, pectoralis taken medially.  The upper centimeter of pectoralis tendon was released.  Conjoined tendon identified and retracted.  Subscapularis peeled off the lesser tuberosity and tagged for protecting the axillary nerve.  Extensive release performed around the humerus and down around the humeral neck, releasing the capsule and progressively externally rotated on delivery of the humeral head.  The wound released in the rotator cuff.  The repair was intact, but it was extensively scarred the deltoid.  This interval was released and the rotator cuff was resected.  We entered the proximal humerus with a 6 mm reamer, reaming up to a size #10.  We then went ahead and the patient's humeral shaft tapered and there was thick cortex distally, so we could only get a tendon.  We then went ahead and resected the humeral head, off the intramedullary guide, set on the 20 degrees retroversion.  Once we made that cut, we removed the excess osteophytes in the humerus with a rongeur.  We then milled for the epi to left epiphysis, set on 0 setting, placed in 20 degrees of retroversion.  Once we had that milled, we impacted the trial component into place.  We made sure that seated well and marked our actuator of the epiphysis for rotational placement at the time of final implantation.  We then went ahead and did a 360-degree exposure of the glenoid that required extensive scar tissue removal and rotator cuff remnant removal.  We did  a full capsular removal and glenoid labral removal.  At this point, we had a nice visualization to carefully protect the axillary nerve.  We had good visualization of the glenoid phase.  We placed our central guide pin, reamed for the metaglene, and then drilled a central peg hole for the metaglene, as well as the peripheral hand reaming.  We then placed or impacted a metaglene in position.  We were able to get a 48 inferiorly, 36 at the base of the coracoid, and then 24 locked anteriorly in an 18 nonlocked posteriorly. Excellent purchase with our screws and security with our metaglene.  We then introduced a 42 standard glenosphere, impacted that and screwed at home and were pleased with that position, inferior on the glenoid phase. We checked the axillary nerve to make sure it was free and clear.  We then went ahead and realized there would be no repair of the subscapularis, as it was extensively scarred and could not move very well.  At this point, we irrigated the humeral canal, used impaction grafting technique, with available bone graft in the humeral head and then placed our 10 body epi 2 left metaphyseal portion of the humeral component, placed again on the 0 setting 20 degrees retroversion, we impacted that in position, had great security and then selected a +6 trial poly.  We were able to reduce the shoulder, had excellent stability, nice conjoined tensioning.  No gapping with sulcus maneuver or with external rotation and selected that as our real poly size, so we removed the trial component and impacted the 42 +6 polyethylene into the humeral component and reduced the shoulder with a nice pop.  We had good stability in all areas of motion and no impingement either bony or soft tissue.  We thoroughly irrigated the shoulder, resected the remaining subscapularis tendon and then checked our axillary nerve one final time to make sure it was in good shape, it was not under too much  tension and then repaired deltopectoral interval with interrupted 0 Vicryl suture, followed by 2-0 Vicryl subcutaneous closure, and 4-0 Monocryl for skin. Steri-Strips applied followed by sterile dressing.  The patient tolerated surgery well.     Doran Heater. Veverly Fells, M.D.     SRN/MEDQ  D:  09/23/2015  T:  09/23/2015  Job:  [id number]

## 2015-09-24 LAB — BASIC METABOLIC PANEL
ANION GAP: 9 (ref 5–15)
BUN: 10 mg/dL (ref 6–20)
CALCIUM: 8.4 mg/dL — AB (ref 8.9–10.3)
CHLORIDE: 105 mmol/L (ref 101–111)
CO2: 24 mmol/L (ref 22–32)
Creatinine, Ser: 0.89 mg/dL (ref 0.61–1.24)
GFR calc Af Amer: >60 mL/min (ref >60)
GFR calc non Af Amer: >60 mL/min (ref >60)
GLUCOSE: 189 mg/dL — AB (ref 65–99)
Potassium: 3.8 mmol/L (ref 3.5–5.1)
Sodium: 138 mmol/L (ref 135–145)

## 2015-09-24 LAB — GLUCOSE, CAPILLARY
GLUCOSE-CAPILLARY: 172 mg/dL — AB (ref 65–99)
Glucose-Capillary: 172 mg/dL — ABNORMAL HIGH (ref 65–99)
Glucose-Capillary: 187 mg/dL — ABNORMAL HIGH (ref 65–99)
Glucose-Capillary: 156 mg/dL — ABNORMAL HIGH (ref 65–99)

## 2015-09-24 LAB — HEMOGLOBIN AND HEMATOCRIT, BLOOD
HCT: 35.4 % — ABNORMAL LOW (ref 39.0–52.0)
HEMOGLOBIN: 11.5 g/dL — AB (ref 13.0–17.0)

## 2015-09-24 NOTE — Progress Notes (Signed)
Occupational Therapy Treatment Patient Details Name: Brian BEHAR Sr. MRN: [id number] DOB: Mar 02, 1941 Today's Date: 09/24/2015    History of present illness Pt is a 75 y.o. male s/p LEFT REVERSE TOTAL SHOULDER ARTHROPLASTY (Left). PMH: DM 2, sleep apnea, neck injury, chronic back pain.    OT comments  Pt making progress toward OT goals; limited by pain but agreeable to participate in therapy. Pt tolerated LUE ROM exercises but reports significant increase in pain with shoulder exercises. Pt continues to require min hand held assist for functional mobility. Reviewed shoulder, ADL, and safety education. Will continue to follow acutely.    Follow Up Recommendations  Supervision/Assistance - 24 hour;Other (comment) (follow up per MD)    Equipment Recommendations  None recommended by OT    Recommendations for Other Services      Precautions / Restrictions Precautions Precautions: Shoulder;Fall Type of Shoulder Precautions: Active protocol: AROM/PROM FF 90, ABD 60, ER 30. AROM elbow, wrist, hand OK. Shoulder Interventions: Shoulder sling/immobilizer;For comfort (and sleep) Precaution Booklet Issued: Yes (comment) Precaution Comments: Reviewed all precautions Required Braces or Orthoses: Sling Restrictions Weight Bearing Restrictions: Yes LUE Weight Bearing: Non weight bearing       Mobility Bed Mobility Overal bed mobility: Needs Assistance Bed Mobility: Sit to Supine     Supine to sit: Min assist Sit to supine: Min guard   General bed mobility comments: Min guard for safety. HOB flat and no use of rails. Good technique.  Transfers Overall transfer level: Needs assistance Equipment used: 1 person hand held assist Transfers: Sit to/from Stand Sit to Stand: Min assist         General transfer comment: Min assist to boost up from chair. Good hand placement and technique.    Balance Overall balance assessment: Needs assistance Sitting-balance support: Feet  supported;Single extremity supported Sitting balance-Leahy Scale: Fair     Standing balance support: Single extremity supported Standing balance-Leahy Scale: Poor Standing balance comment: Hand held support required                   ADL Overall ADL's : Needs assistance/impaired Eating/Feeding: Set up;Sitting   Grooming: Minimal assistance;Standing Grooming Details (indicate cue type and reason): Min assist for support in standing secondary to decreased balance Upper Body Bathing: Moderate assistance;Sitting Upper Body Bathing Details (indicate cue type and reason): Educated on UB bathing technique Lower Body Bathing: Minimal assistance;Sit to/from stand   Upper Body Dressing : Moderate assistance;Sitting Upper Body Dressing Details (indicate cue type and reason): Mod assist to doff/don sling Lower Body Dressing: Minimal assistance;Sit to/from stand   Toilet Transfer: Minimal assistance;Ambulation;Regular Glass blower/designer Details (indicate cue type and reason): Discussed use of 3 in 1 over toilet for hand rails to push up on with sit <> stand. Toileting- Clothing Manipulation and Hygiene: Minimal assistance;Sit to/from stand       Functional mobility during ADLs: Minimal assistance General ADL Comments: No family present for OT eval. Pt continues to require min hand held assist for functional mobility. Reviewed sling management and wear schedule, ice for edema and pain, LUE positioning. LUE exercises.       Vision                     Perception     Praxis      Cognition   Behavior During Therapy: [facility] for tasks assessed/performed Overall Cognitive Status: Within Functional Limits for tasks assessed  Extremity/Trunk Assessment  Upper Extremity Assessment Upper Extremity Assessment: LUE deficits/detail LUE Deficits / Details: AROM elbow, wrist, hand WFL. Pt reports minor numbness/tingling in finger tips. LUE: Unable to  fully assess due to pain;Unable to fully assess due to immobilization   Lower Extremity Assessment Lower Extremity Assessment: Generalized weakness   Cervical / Trunk Assessment Cervical / Trunk Assessment: Kyphotic    Exercises Shoulder Exercises Shoulder Flexion: AAROM;Left;10 reps;Seated (0-45 degrees) Shoulder ABduction: AAROM;Left;10 reps;Seated (0-60 degrees) Shoulder External Rotation: AAROM;Left;10 reps;Seated (unable to acheive neutral) Elbow Flexion: AROM;Left;10 reps;Seated (full ROM with increased time) Wrist Flexion: AROM;Left;10 reps;Seated (full ROM) Digit Composite Flexion: AROM;Left;10 reps;Seated (full ROM) Donning/doffing shirt without moving shoulder: Moderate assistance Method for sponge bathing under operated UE: Moderate assistance Donning/doffing sling/immobilizer: Moderate assistance Correct positioning of sling/immobilizer: Moderate assistance ROM for elbow, wrist and digits of operated UE: Minimal assistance Sling wearing schedule (on at all times/off for ADL's): Supervision/safety Proper positioning of operated UE when showering: Minimal assistance Positioning of UE while sleeping: Minimal assistance   Shoulder Instructions Shoulder Instructions Donning/doffing shirt without moving shoulder: Moderate assistance Method for sponge bathing under operated UE: Moderate assistance Donning/doffing sling/immobilizer: Moderate assistance Correct positioning of sling/immobilizer: Moderate assistance ROM for elbow, wrist and digits of operated UE: Minimal assistance Sling wearing schedule (on at all times/off for ADL's): Supervision/safety Proper positioning of operated UE when showering: Minimal assistance Positioning of UE while sleeping: Minimal assistance     General Comments      Pertinent Vitals/ Pain       Pain Assessment: 0-10 Pain Score: 10-Worst pain ever Pain Location: L shoulder with exercises Pain Descriptors / Indicators:  Aching;Grimacing;Guarding;Moaning Pain Intervention(s): Limited activity within patient's tolerance;Monitored during session;Repositioned;Ice applied  Home Living Family/patient expects to be discharged to:: Private residence Living Arrangements: Spouse/significant other Available Help at Discharge: Family;Available 24 hours/day Type of Home: House Home Access: Level entry     Home Layout: One level     Bathroom Shower/Tub: Walk-in shower;Tub/shower unit (uses walk in)   [employer]: Standard     Home Equipment: Environmental consultant - 2 wheels;Cane - single point;Bedside commode;Shower seat          Prior Functioning/Environment Level of Independence: Independent            Frequency Min 3X/week     Progress Toward Goals  OT Goals(current goals can now be found in the care plan section)  Progress towards OT goals: Progressing toward goals  Acute Rehab OT Goals Patient Stated Goal: none stated  OT Goal Formulation: With patient Time For Goal Achievement: 10/08/15 Potential to Achieve Goals: Good ADL Goals Pt Will Perform Upper Body Bathing: with supervision;sitting Pt Will Perform Upper Body Dressing: with supervision;sitting Pt/caregiver will Perform Home Exercise Program: Increased ROM;Left upper extremity;With minimal assist;With written HEP provided Additional ADL Goal #1: Pt/caregiver will demonstrate independence donning/doffing sling.  Plan Discharge plan remains appropriate    Co-evaluation                 End of Session Equipment Utilized During Treatment: Other (comment) (sling)   Activity Tolerance Patient tolerated treatment well   Patient Left in bed;with call bell/phone within reach;with nursing/sitter in room   Nurse Communication         Time: [id number] OT Time Calculation (min): 28 min  Charges: OT General Charges $OT Visit: 1 Procedure OT Treatments $Self Care/Home Management : 8-22 mins $Therapeutic Exercise: 8-22 mins  Binnie Kand M.S., OTR/L Pager: [phone]  09/24/2015, 10:29  AM    

## 2015-09-24 NOTE — Evaluation (Signed)
Occupational Therapy Evaluation Patient Details Name: Brian Ellison Sr. MRN: [id number] DOB: 12-12-1940 Today's Date: 09/24/2015    History of Present Illness Pt is a 75 y.o. male s/p LEFT REVERSE TOTAL SHOULDER ARTHROPLASTY (Left). PMH: DM 2, sleep apnea, neck injury, chronic back pain.    Clinical Impression   Pt reports he was independent with ADLs and mobility PTA. Currently pt is overall min assist for ADLs and functional mobility with the exception of mod assist for UB ADLs. Began ADL, shoulder, and safety education with pt; he verbalized understanding. Pt planning to d/c home with 24/7 supervision from his wife. Pt would benefit from continued skilled OT to maximize independence and safety with UB ADLs and HEP prior to return home.     Follow Up Recommendations  Supervision/Assistance - 24 hour;Other (comment) (follow up per MD)    Equipment Recommendations  None recommended by OT    Recommendations for Other Services       Precautions / Restrictions Precautions Precautions: Shoulder;Fall Type of Shoulder Precautions: Active protocol: AROM/PROM FF 90, ABD 60, ER 30. AROM elbow, wrist, hand OK. Shoulder Interventions: Shoulder sling/immobilizer;For comfort (and sleep) Precaution Booklet Issued: Yes (comment) Precaution Comments: Educated on precautions Required Braces or Orthoses: Sling Restrictions Weight Bearing Restrictions: Yes LUE Weight Bearing: Non weight bearing      Mobility Bed Mobility Overal bed mobility: Needs Assistance Bed Mobility: Supine to Sit     Supine to sit: Min assist     General bed mobility comments: Min hand held assist to pull up from supine to sit. Assist given at back once in seated position for pt to regain balance and shift hips to EOB with feet flat on the floor.  Transfers Overall transfer level: Needs assistance Equipment used: 1 person hand held assist Transfers: Sit to/from Stand Sit to Stand: Min assist          General transfer comment: Min assist to boost up from EOB. VCs for hand placement on bed to assist with boosting up. Once in standing pt required min hand held assist for balance.    Balance Overall balance assessment: Needs assistance Sitting-balance support: Feet supported;Single extremity supported Sitting balance-Leahy Scale: Fair     Standing balance support: Single extremity supported Standing balance-Leahy Scale: Poor Standing balance comment: Relies on hand held support                            ADL Overall ADL's : Needs assistance/impaired Eating/Feeding: Set up;Sitting   Grooming: Minimal assistance;Standing Grooming Details (indicate cue type and reason): Min assist for support in standing secondary to decreased balance Upper Body Bathing: Moderate assistance;Sitting Upper Body Bathing Details (indicate cue type and reason): Educated on UB bathing technique Lower Body Bathing: Minimal assistance;Sit to/from stand   Upper Body Dressing : Moderate assistance;Sitting Upper Body Dressing Details (indicate cue type and reason): Educated on UB dressing technique Lower Body Dressing: Minimal assistance;Sit to/from stand   Toilet Transfer: Minimal assistance;Ambulation;Regular Glass blower/designer Details (indicate cue type and reason): Hand held assist provided for support with standing and functional mobility. Simulated toilet transfer with transfer from EOB to chair. Toileting- Clothing Manipulation and Hygiene: Minimal assistance;Sit to/from stand       Functional mobility during ADLs: Minimal assistance General ADL Comments: No family present for OT eval. Educated on sling management and wear schedule, ice for edema and pain, L UE positioning in bed and chair; pt verbalized understanding. Pt  reports dizziness coming from supine to sit and sit to stand. Resolved after a few minutes in sitting/standing.     Vision     Perception     Praxis       Pertinent Vitals/Pain Pain Assessment: 0-10 Pain Score: 8  Pain Location: L shoulder Pain Descriptors / Indicators: Aching;Grimacing;Operative site guarding Pain Intervention(s): Limited activity within patient's tolerance;Monitored during session;Repositioned;Patient requesting pain meds-RN notified;Ice applied     Hand Dominance Right   Extremity/Trunk Assessment Upper Extremity Assessment Upper Extremity Assessment: LUE deficits/detail LUE Deficits / Details: AROM elbow, wrist, hand WFL. Pt reports minor numbness/tingling in finger tips. LUE: Unable to fully assess due to pain;Unable to fully assess due to immobilization   Lower Extremity Assessment Lower Extremity Assessment: Generalized weakness   Cervical / Trunk Assessment Cervical / Trunk Assessment: Kyphotic   Communication Communication Communication: No difficulties   Cognition Arousal/Alertness: Awake/alert Behavior During Therapy: WFL for tasks assessed/performed Overall Cognitive Status: Within Functional Limits for tasks assessed                     General Comments       Exercises Exercises: Shoulder     Shoulder Instructions Shoulder Instructions Donning/doffing shirt without moving shoulder: Moderate assistance (educated) Method for sponge bathing under operated UE: Moderate assistance (educated) Donning/doffing sling/immobilizer: Maximal assistance (educated) Correct positioning of sling/immobilizer: Moderate assistance (educated) Sling wearing schedule (on at all times/off for ADL's): Supervision/safety (educated) Proper positioning of operated UE when showering: Minimal assistance (educated) Positioning of UE while sleeping: Minimal assistance (educated)    Home Living Family/patient expects to be discharged to:: Private residence Living Arrangements: Spouse/significant other Available Help at Discharge: Family;Available 24 hours/day Type of Home: House Home Access: Level entry      Home Layout: One level     Bathroom Shower/Tub: Walk-in shower;Tub/shower unit (uses walk in)   [employer]: Standard     Home Equipment: Environmental consultant - 2 wheels;Cane - single point;Bedside commode;Shower seat          Prior Functioning/Environment Level of Independence: Independent             OT Diagnosis: Generalized weakness;Acute pain   OT Problem List: Decreased strength;Decreased range of motion;Decreased activity tolerance;Impaired balance (sitting and/or standing);Decreased knowledge of use of DME or AE;Decreased knowledge of precautions;Impaired UE functional use;Pain   OT Treatment/Interventions: Self-care/ADL training;Therapeutic exercise;Energy conservation;DME and/or AE instruction;Patient/family education    OT Goals(Current goals can be found in the care plan section) Acute Rehab OT Goals Patient Stated Goal: return to PLOF OT Goal Formulation: With patient Time For Goal Achievement: 10/08/15 Potential to Achieve Goals: Good ADL Goals Pt Will Perform Upper Body Bathing: with supervision;sitting Pt Will Perform Upper Body Dressing: with supervision;sitting Pt/caregiver will Perform Home Exercise Program: Increased ROM;Left upper extremity;With minimal assist;With written HEP provided Additional ADL Goal #1: Pt/caregiver will demonstrate independence donning/doffing sling.  OT Frequency: Min 3X/week   Barriers to D/C:            Co-evaluation              End of Session Equipment Utilized During Treatment: Other (comment) (sling) Nurse Communication: Patient requests pain meds;Other (comment) (needs one more OT session prior to d/c)  Activity Tolerance: Patient tolerated treatment well Patient left: in chair;with call bell/phone within reach   Time: 0723-0747 OT Time Calculation (min): 24 min Charges:  OT General Charges $OT Visit: 1 Procedure OT Evaluation $OT Eval Moderate Complexity: 1 Procedure OT  Treatments $Self Care/Home Management  : 8-22 mins G-Codes:     Binnie Kand M.S., OTR/L Pager: [phone]  09/24/2015, 8:04 AM

## 2015-09-24 NOTE — Progress Notes (Addendum)
Subjective: 1 Day Post-Op Procedure(s) (LRB): LEFT REVERSE TOTAL SHOULDER ARTHROPLASTY (Left)  Patient reports pain as moderate.  Anxious to go home, but notes that pain is a concern.  Tolerating POs well.  Denies BM, however admits to flatulence.  Denies fever, chills, N/V.  Working with therapy upon arrival out of sling.  Objective:   VITALS:  Temp:  [97 F (36.1 C)-100.2 F (37.9 C)] 98.9 F (37.2 C) (01/14 [id number]) Pulse Rate:  [57-82] 80 (01/14 0632) Resp:  [9-31] 17 (01/14 0632) BP: (107-130)/(56-79) 111/57 mmHg (01/14 0632) SpO2:  [92 %-97 %] 93 % (01/14 [id number])  General: WDWN patient in NAD. Psych:  Appropriate mood and affect. Neuro:  A&O x 3, Moving all extremities, sensation intact to light touch HEENT:  EOMs intact Chest:  Even non-labored respirations Skin:  Dressing C/D/I, no rashes or lesions Extremities: warm/dry, mild edema, no erythmea or echymosis.  No lymphadenopathy. Pulses: Radial 2+ MSK:  ROM: Elbow 0-100 degrees, MMT: grip strength 5/5 compared bilaterally    LABS  Recent Labs  09/24/15 0535  HGB 11.5*    Recent Labs  09/24/15 0535  NA 138  K 3.8  CL 105  CO2 24  BUN 10  CREATININE 0.89  GLUCOSE 189*   No results for input(s): LABPT, INR in the last 72 hours.   Assessment/Plan: 1 Day Post-Op Procedure(s) (LRB): LEFT REVERSE TOTAL SHOULDER ARTHROPLASTY (Left)  Up with therapy D/C IV fluids Plan for discharge tomorrow  Plan for post-op visit in office with Dr. Veverly Fells.  Mechele Claude, PA-C, [employer] Office:  [phone]

## 2015-09-25 LAB — GLUCOSE, CAPILLARY
GLUCOSE-CAPILLARY: 158 mg/dL — AB (ref 65–99)
Glucose-Capillary: 178 mg/dL — ABNORMAL HIGH (ref 65–99)

## 2015-09-25 NOTE — Progress Notes (Signed)
Occupational Therapy Treatment Patient Details Name: Brian NOGUEZ Sr. MRN: [id number] DOB: 06-08-1941 Today's Date: 09/25/2015    History of present illness Pt is a 75 y.o. male s/p LEFT REVERSE TOTAL SHOULDER ARTHROPLASTY (Left). PMH: DM 2, sleep apnea, neck injury, chronic back pain.    OT comments  Pt making gradual progress toward OT goals. Pain continues to be a limiting factor with LUE ROM exercises (slight decrease in elbow and shoulder ROM today); but pt agreeable to therapy and tolerating all exercises. Pt currently mod assist for UB ADLs, min assist for LB ADLs, and min assist for balance in standing. Pt noted to have one major LOB backward, sitting back onto bed from standing position. Educated on home safety and need for someone to assist with ADLs and functional mobility upon return home. Pt seems to have a good understanding of shoulder, safety, and ADL education; but reviewed all. Pt is ready for d/c from an OT standpoint but will continue to follow acutely.    Follow Up Recommendations  Supervision/Assistance - 24 hour;Other (comment) (follow up per MD)    Equipment Recommendations  None recommended by OT    Recommendations for Other Services      Precautions / Restrictions Precautions Precautions: Shoulder;Fall Type of Shoulder Precautions: Active protocol: AROM/PROM FF 90, ABD 60, ER 30. AROM elbow, wrist, hand OK. Shoulder Interventions: Shoulder sling/immobilizer;For comfort (and sleep) Precaution Comments: Reviewed all precautions Required Braces or Orthoses: Sling Restrictions Weight Bearing Restrictions: Yes LUE Weight Bearing: Non weight bearing       Mobility Bed Mobility               General bed mobility comments: Pt sitting EOB upon arrival.  Transfers Overall transfer level: Needs assistance Equipment used: 1 person hand held assist Transfers: Sit to/from Stand Sit to Stand: Min assist         General transfer comment: Min assist to  boost up from EOB x 2. Multiple attempts required for each sit to stand. Pt using rocking technique to achieve sit to stand.    Balance Overall balance assessment: Needs assistance Sitting-balance support: Feet supported;No upper extremity supported Sitting balance-Leahy Scale: Good     Standing balance support: Single extremity supported Standing balance-Leahy Scale: Poor Standing balance comment: Hand held support required for balance in standing. One major LOB noted with LB dressing activity requiring pt to sit back down on bed behind him.                    ADL Overall ADL's : Needs assistance/impaired           Upper Body Bathing Details (indicate cue type and reason): Reviewed UB bathing technique.     Upper Body Dressing : Moderate assistance;Sitting Upper Body Dressing Details (indicate cue type and reason): Assist for donning button up shirt and sling. Reviewed UB dressing technique with functional activity. Lower Body Dressing: Minimal assistance;Sit to/from stand Lower Body Dressing Details (indicate cue type and reason): Assist to pull pants up in standing. One LOB backwards requiring pt to sit back down on bed. Educated on need for assist with ADLs and functional mobility, sitting for dressing activities, bed or chair behind him when pulling up pants incase he loses balance backward; pt reports daughter will be available to help initially.             Functional mobility during ADLs: Minimal assistance General ADL Comments: No family present for OT session. Reviewed sling management  and wear schedule, L UE positioning in bed/chair, ice for edema and pain, and LUE exercises; pt verbalized understanding. Educated pt on need for assist with ADLs and functional mobility upon return home for safety; pt verbalized understanding stating that his daughter would be with him to help out and he wouldnt get up alone.      Vision                     Perception      Praxis      Cognition   Behavior During Therapy: [facility] for tasks assessed/performed Overall Cognitive Status: Within Functional Limits for tasks assessed                       Extremity/Trunk Assessment               Exercises Shoulder Exercises Shoulder Flexion: AAROM;Left;10 reps;Seated (0-30 degrees) Shoulder ABduction: AAROM;Left;10 reps;Seated (0-45 degrees) Shoulder External Rotation: AAROM;Left;10 reps;Seated (able to acheive neutral) Elbow Flexion: AROM;Left;10 reps;Seated;Self ROM (0-100 degrees with self ROM) Wrist Flexion: AROM;Left;10 reps;Seated (full ROM) Digit Composite Flexion: AROM;Left;10 reps;Seated (full ROM) Donning/doffing shirt without moving shoulder: Moderate assistance (reviewed technique) Method for sponge bathing under operated UE: Moderate assistance (reviewed technique) Donning/doffing sling/immobilizer: Moderate assistance (reviewed technique) Correct positioning of sling/immobilizer: Supervision/safety (reviewed technique) ROM for elbow, wrist and digits of operated UE: Minimal assistance (reviewed technique) Sling wearing schedule (on at all times/off for ADL's): Supervision/safety (reviewed) Proper positioning of operated UE when showering: Minimal assistance (reviewed technique) Positioning of UE while sleeping: Minimal assistance (reviewed technique)   Shoulder Instructions Shoulder Instructions Donning/doffing shirt without moving shoulder: Moderate assistance (reviewed technique) Method for sponge bathing under operated UE: Moderate assistance (reviewed technique) Donning/doffing sling/immobilizer: Moderate assistance (reviewed technique) Correct positioning of sling/immobilizer: Supervision/safety (reviewed technique) ROM for elbow, wrist and digits of operated UE: Minimal assistance (reviewed technique) Sling wearing schedule (on at all times/off for ADL's): Supervision/safety (reviewed) Proper positioning of operated UE when  showering: Minimal assistance (reviewed technique) Positioning of UE while sleeping: Minimal assistance (reviewed technique)     General Comments      Pertinent Vitals/ Pain       Pain Assessment: 0-10 Pain Score: 9  (10 with exercises) Pain Location: L shoulder and sore in elbow Pain Descriptors / Indicators: Aching;Grimacing;Operative site guarding;Sharp;Sore Pain Intervention(s): Limited activity within patient's tolerance;Monitored during session;Repositioned;Ice applied;Patient requesting pain meds-RN notified  Home Living                                          Prior Functioning/Environment              Frequency Min 3X/week     Progress Toward Goals  OT Goals(current goals can now be found in the care plan section)  Progress towards OT goals: Progressing toward goals  Acute Rehab OT Goals Patient Stated Goal: to go home today OT Goal Formulation: With patient  Plan Discharge plan remains appropriate    Co-evaluation                 End of Session Equipment Utilized During Treatment: Other (comment) (sling)   Activity Tolerance Patient tolerated treatment well   Patient Left in chair;with call bell/phone within reach;with nursing/sitter in room   Nurse Communication Patient requests pain meds;Other (comment) (pt ready for d/c from OT standpoint)  Time: [id number] OT Time Calculation (min): 31 min  Charges: OT General Charges $OT Visit: 1 Procedure OT Treatments $Self Care/Home Management : 8-22 mins $Therapeutic Exercise: 8-22 mins  Binnie Kand M.S., OTR/L Pager: [phone]  09/25/2015, 8:20 AM

## 2015-09-25 NOTE — Progress Notes (Signed)
    Subjective: 2 Days Post-Op Procedure(s) (LRB): LEFT REVERSE TOTAL SHOULDER ARTHROPLASTY (Left) Patient reports pain as 3 on 0-10 scale.   Denies CP or SOB.  Voiding without difficulty. Positive flatus. Objective: Vital signs in last 24 hours: Temp:  [98.4 F (36.9 C)-99.9 F (37.7 C)] 98.4 F (36.9 C) (01/15 0636) Pulse Rate:  [81-84] 83 (01/15 0636) Resp:  [16-18] 16 (01/15 0636) BP: (111-127)/(54-61) 127/61 mmHg (01/15 0636) SpO2:  [91 %-92 %] 92 % (01/15 0636)  Intake/Output from previous day: 01/14 0701 - 01/15 0700 In: 720 [P.O.:720] Out: 900 [Urine:900] Intake/Output this shift: Total I/O In: -  Out: 700 [Urine:700]  Labs:  Recent Labs  09/24/15 0535  HGB 11.5*    Recent Labs  09/24/15 0535  HCT 35.4*    Recent Labs  09/24/15 0535  NA 138  K 3.8  CL 105  CO2 24  BUN 10  CREATININE 0.89  GLUCOSE 189*  CALCIUM 8.4*   No results for input(s): LABPT, INR in the last 72 hours.  Physical Exam: Neurologically intact ABD soft Intact pulses distally Incision: dressing C/D/I and no drainage  Assessment/Plan: 2 Days Post-Op Procedure(s) (LRB): LEFT REVERSE TOTAL SHOULDER ARTHROPLASTY (Left) Advance diet Up with therapy  Doing well Plan on d/c today after therapy  Hibba Schram D for Dr. Melina Schools [facility] [phone] 09/25/2015, 6:54 AM

## 2015-09-25 NOTE — Progress Notes (Signed)
Prescriptions and discharge instructions provided to patient and wife.  IV removed.  No discharge related questions at the time of discharge.  Patient escorted via wheelchair by [place], [place].

## 2015-09-26 ENCOUNTER — Encounter (HOSPITAL_COMMUNITY): Payer: Self-pay | Admitting: Orthopedic Surgery

## 2015-09-29 NOTE — Discharge Summary (Signed)
Physician Discharge Summary   Patient ID: Brian BOYTER Sr. MRN: [id number] DOB/AGE: 01/30/1941 75 y.o.  Admit date: 09/23/2015 Discharge date: 09/25/15  Admission Diagnoses:  Active Problems:   S/P shoulder replacement   Discharge Diagnoses:  Same   Surgeries: Procedure(s): LEFT REVERSE TOTAL SHOULDER ARTHROPLASTY on 09/23/2015   Consultants: PT/OT  Discharged Condition: Stable  Hospital Course: Brian BERMAN Sr. is an 75 y.o. male who was admitted 09/23/2015 with a chief complaint of No chief complaint on file. , and found to have a diagnosis of <principal problem not specified>.  They were brought to the operating room on 09/23/2015 and underwent the above named procedures.    The patient had an uncomplicated hospital course and was stable for discharge.  Recent vital signs:  Filed Vitals:   09/24/15 2210 09/25/15 0636  BP: 111/54 127/61  Pulse: 81 83  Temp: 99.9 F (37.7 C) 98.4 F (36.9 C)  Resp: 16 16    Recent laboratory studies:  Results for orders placed or performed during the hospital encounter of 09/23/15  Glucose, capillary  Result Value Ref Range   Glucose-Capillary 167 (H) 65 - 99 mg/dL  Glucose, capillary  Result Value Ref Range   Glucose-Capillary 175 (H) 65 - 99 mg/dL  Hemoglobin and hematocrit, blood  Result Value Ref Range   Hemoglobin 11.5 (L) 13.0 - 17.0 g/dL   HCT 35.4 (L) 39.0 - [id number] %  Basic metabolic panel  Result Value Ref Range   Sodium 138 135 - 145 mmol/L   Potassium 3.8 3.5 - 5.1 mmol/L   Chloride 105 101 - 111 mmol/L   CO2 24 22 - 32 mmol/L   Glucose, Bld 189 (H) 65 - 99 mg/dL   BUN 10 6 - 20 mg/dL   Creatinine, Ser 0.89 0.61 - 1.24 mg/dL   Calcium 8.4 (L) 8.9 - 10.3 mg/dL   GFR calc non Af Amer >60 >60 mL/min   GFR calc Af Amer >60 >60 mL/min   Anion gap 9 5 - 15  Glucose, capillary  Result Value Ref Range   Glucose-Capillary 179 (H) 65 - 99 mg/dL  Glucose, capillary  Result Value Ref Range   Glucose-Capillary 166 (H)  65 - 99 mg/dL  Glucose, capillary  Result Value Ref Range   Glucose-Capillary 172 (H) 65 - 99 mg/dL  Glucose, capillary  Result Value Ref Range   Glucose-Capillary 187 (H) 65 - 99 mg/dL  Glucose, capillary  Result Value Ref Range   Glucose-Capillary 172 (H) 65 - 99 mg/dL  Glucose, capillary  Result Value Ref Range   Glucose-Capillary 156 (H) 65 - 99 mg/dL  Glucose, capillary  Result Value Ref Range   Glucose-Capillary 158 (H) 65 - 99 mg/dL  Glucose, capillary  Result Value Ref Range   Glucose-Capillary 178 (H) 65 - 99 mg/dL    Discharge Medications:     Medication List    STOP taking these medications        cetirizine 10 MG tablet  Commonly known as:  ZYRTEC      TAKE these medications        aspirin 81 MG tablet  Take 1 tablet (81 mg total) by mouth daily.     brimonidine 0.1 % Soln  Commonly known as:  ALPHAGAN P  Place 1 drop into both eyes 3 (three) times daily.     Fish Oil 1200 MG Caps  Take 1 capsule by mouth daily.     flunisolide 29 MCG/ACT (0.025%)  nasal spray  Commonly known as:  NASAREL  Place 2 sprays into the nose daily as needed. For allergies     gabapentin 600 MG tablet  Commonly known as:  NEURONTIN  Take 600 mg by mouth 2 (two) times daily.     loratadine 10 MG tablet  Commonly known as:  CLARITIN  Take 10 mg by mouth daily.     metFORMIN 500 MG tablet  Commonly known as:  GLUCOPHAGE  Take 500 mg by mouth 3 (three) times daily.     methocarbamol 500 MG tablet  Commonly known as:  ROBAXIN  Take 1 tablet (500 mg total) by mouth 3 (three) times daily as needed.     multivitamin capsule  Take 1 capsule by mouth daily.     omeprazole 20 MG capsule  Commonly known as:  PRILOSEC  Take 40 mg by mouth 2 (two) times daily.     oxyCODONE-acetaminophen 7.5-325 MG tablet  Commonly known as:  PERCOCET  Take 1-2 tablets by mouth every 4 (four) hours as needed for moderate pain or severe pain.     Potassium 99 MG Tabs  Take 1 tablet by  mouth every morning.     rOPINIRole 1 MG tablet  Commonly known as:  REQUIP  Take 1 mg by mouth at bedtime.     Vitamin D3 2000 units Tabs  Take 1 tablet by mouth daily.        Diagnostic Studies: Dg Shoulder Left Port  09/23/2015  CLINICAL DATA:  Left shoulder replacement EXAM: LEFT SHOULDER - 1 VIEW COMPARISON:  CT 02/12/2011 FINDINGS: Reverse glenohumeral arthroplasty on the left without periprosthetic fracture. Alignment is unremarkable. Low lung volumes which accounts for interstitial crowding. IMPRESSION: No acute finding after left glenohumeral arthroplasty. Electronically Signed   By: Monte Fantasia M.D.   On: 09/23/2015 13:20    Disposition: 01-Home or Self Care        Follow-up Information    Follow up with NORRIS,STEVEN R, MD. Call in 2 weeks.   Specialty:  Orthopedic Surgery   Why:  [phone]   Contact information:   [address] [place] [address] [phone]        Signed: KAMREN, Brian Ellison 09/29/2015, 11:22 AM

## 2015-10-06 DIAGNOSIS — Z96612 Presence of left artificial shoulder joint: Secondary | ICD-10-CM | POA: Diagnosis not present

## 2015-10-06 DIAGNOSIS — M19012 Primary osteoarthritis, left shoulder: Secondary | ICD-10-CM | POA: Diagnosis not present

## 2015-10-06 DIAGNOSIS — Z471 Aftercare following joint replacement surgery: Secondary | ICD-10-CM | POA: Diagnosis not present

## 2015-10-20 DIAGNOSIS — N3941 Urge incontinence: Secondary | ICD-10-CM | POA: Diagnosis not present

## 2015-10-20 DIAGNOSIS — R350 Frequency of micturition: Secondary | ICD-10-CM | POA: Diagnosis not present

## 2015-10-27 DIAGNOSIS — E119 Type 2 diabetes mellitus without complications: Secondary | ICD-10-CM | POA: Diagnosis not present

## 2015-10-27 DIAGNOSIS — N3941 Urge incontinence: Secondary | ICD-10-CM | POA: Diagnosis not present

## 2015-10-27 DIAGNOSIS — H401132 Primary open-angle glaucoma, bilateral, moderate stage: Secondary | ICD-10-CM | POA: Diagnosis not present

## 2015-10-27 DIAGNOSIS — H524 Presbyopia: Secondary | ICD-10-CM | POA: Diagnosis not present

## 2015-11-03 DIAGNOSIS — N3941 Urge incontinence: Secondary | ICD-10-CM | POA: Diagnosis not present

## 2015-11-03 DIAGNOSIS — Z96612 Presence of left artificial shoulder joint: Secondary | ICD-10-CM | POA: Diagnosis not present

## 2015-11-03 DIAGNOSIS — Z471 Aftercare following joint replacement surgery: Secondary | ICD-10-CM | POA: Diagnosis not present

## 2015-11-03 DIAGNOSIS — M19012 Primary osteoarthritis, left shoulder: Secondary | ICD-10-CM | POA: Diagnosis not present

## 2015-11-10 DIAGNOSIS — N3941 Urge incontinence: Secondary | ICD-10-CM | POA: Diagnosis not present

## 2015-11-17 DIAGNOSIS — N3941 Urge incontinence: Secondary | ICD-10-CM | POA: Diagnosis not present

## 2015-11-24 DIAGNOSIS — N3941 Urge incontinence: Secondary | ICD-10-CM | POA: Diagnosis not present

## 2015-11-29 DIAGNOSIS — M5136 Other intervertebral disc degeneration, lumbar region: Secondary | ICD-10-CM | POA: Diagnosis not present

## 2015-12-01 DIAGNOSIS — N3941 Urge incontinence: Secondary | ICD-10-CM | POA: Diagnosis not present

## 2015-12-06 DIAGNOSIS — Z1283 Encounter for screening for malignant neoplasm of skin: Secondary | ICD-10-CM | POA: Diagnosis not present

## 2015-12-06 DIAGNOSIS — L570 Actinic keratosis: Secondary | ICD-10-CM | POA: Diagnosis not present

## 2015-12-06 DIAGNOSIS — X32XXXD Exposure to sunlight, subsequent encounter: Secondary | ICD-10-CM | POA: Diagnosis not present

## 2015-12-06 DIAGNOSIS — L821 Other seborrheic keratosis: Secondary | ICD-10-CM | POA: Diagnosis not present

## 2015-12-08 DIAGNOSIS — N3941 Urge incontinence: Secondary | ICD-10-CM | POA: Diagnosis not present

## 2015-12-15 DIAGNOSIS — N3941 Urge incontinence: Secondary | ICD-10-CM | POA: Diagnosis not present

## 2015-12-15 DIAGNOSIS — Z471 Aftercare following joint replacement surgery: Secondary | ICD-10-CM | POA: Diagnosis not present

## 2015-12-15 DIAGNOSIS — M19012 Primary osteoarthritis, left shoulder: Secondary | ICD-10-CM | POA: Diagnosis not present

## 2015-12-15 DIAGNOSIS — Z96612 Presence of left artificial shoulder joint: Secondary | ICD-10-CM | POA: Diagnosis not present

## 2015-12-22 DIAGNOSIS — N3941 Urge incontinence: Secondary | ICD-10-CM | POA: Diagnosis not present

## 2016-01-05 DIAGNOSIS — R350 Frequency of micturition: Secondary | ICD-10-CM | POA: Diagnosis not present

## 2016-01-05 DIAGNOSIS — N3941 Urge incontinence: Secondary | ICD-10-CM | POA: Diagnosis not present

## 2016-01-12 DIAGNOSIS — N3941 Urge incontinence: Secondary | ICD-10-CM | POA: Diagnosis not present

## 2016-02-14 DIAGNOSIS — N3941 Urge incontinence: Secondary | ICD-10-CM | POA: Diagnosis not present

## 2016-02-14 DIAGNOSIS — R350 Frequency of micturition: Secondary | ICD-10-CM | POA: Diagnosis not present

## 2016-02-16 DIAGNOSIS — H401132 Primary open-angle glaucoma, bilateral, moderate stage: Secondary | ICD-10-CM | POA: Diagnosis not present

## 2016-02-16 DIAGNOSIS — E119 Type 2 diabetes mellitus without complications: Secondary | ICD-10-CM | POA: Diagnosis not present

## 2016-02-22 DIAGNOSIS — M5136 Other intervertebral disc degeneration, lumbar region: Secondary | ICD-10-CM | POA: Diagnosis not present

## 2016-02-29 DIAGNOSIS — H31093 Other chorioretinal scars, bilateral: Secondary | ICD-10-CM | POA: Diagnosis not present

## 2016-02-29 DIAGNOSIS — H43813 Vitreous degeneration, bilateral: Secondary | ICD-10-CM | POA: Diagnosis not present

## 2016-02-29 DIAGNOSIS — H35373 Puckering of macula, bilateral: Secondary | ICD-10-CM | POA: Diagnosis not present

## 2016-03-01 DIAGNOSIS — S63652D Sprain of metacarpophalangeal joint of right middle finger, subsequent encounter: Secondary | ICD-10-CM | POA: Diagnosis not present

## 2016-03-06 DIAGNOSIS — N3941 Urge incontinence: Secondary | ICD-10-CM | POA: Diagnosis not present

## 2016-03-27 DIAGNOSIS — M19012 Primary osteoarthritis, left shoulder: Secondary | ICD-10-CM | POA: Diagnosis not present

## 2016-03-27 DIAGNOSIS — Z96612 Presence of left artificial shoulder joint: Secondary | ICD-10-CM | POA: Diagnosis not present

## 2016-03-27 DIAGNOSIS — Z471 Aftercare following joint replacement surgery: Secondary | ICD-10-CM | POA: Diagnosis not present

## 2016-03-28 DIAGNOSIS — L03031 Cellulitis of right toe: Secondary | ICD-10-CM | POA: Diagnosis not present

## 2016-04-03 DIAGNOSIS — N3941 Urge incontinence: Secondary | ICD-10-CM | POA: Diagnosis not present

## 2016-04-03 DIAGNOSIS — R350 Frequency of micturition: Secondary | ICD-10-CM | POA: Diagnosis not present

## 2016-04-10 DIAGNOSIS — S63652D Sprain of metacarpophalangeal joint of right middle finger, subsequent encounter: Secondary | ICD-10-CM | POA: Diagnosis not present

## 2016-05-01 DIAGNOSIS — N3941 Urge incontinence: Secondary | ICD-10-CM | POA: Diagnosis not present

## 2016-05-04 DIAGNOSIS — R197 Diarrhea, unspecified: Secondary | ICD-10-CM | POA: Diagnosis not present

## 2016-05-21 DIAGNOSIS — S63652D Sprain of metacarpophalangeal joint of right middle finger, subsequent encounter: Secondary | ICD-10-CM | POA: Diagnosis not present

## 2016-05-23 DIAGNOSIS — R197 Diarrhea, unspecified: Secondary | ICD-10-CM | POA: Diagnosis not present

## 2016-05-29 DIAGNOSIS — N3941 Urge incontinence: Secondary | ICD-10-CM | POA: Diagnosis not present

## 2016-06-11 DIAGNOSIS — A0472 Enterocolitis due to Clostridium difficile, not specified as recurrent: Secondary | ICD-10-CM | POA: Diagnosis not present

## 2016-06-15 DIAGNOSIS — R197 Diarrhea, unspecified: Secondary | ICD-10-CM | POA: Diagnosis not present

## 2016-06-21 DIAGNOSIS — N3941 Urge incontinence: Secondary | ICD-10-CM | POA: Diagnosis not present

## 2016-06-29 DIAGNOSIS — R197 Diarrhea, unspecified: Secondary | ICD-10-CM | POA: Diagnosis not present

## 2016-07-10 DIAGNOSIS — Z23 Encounter for immunization: Secondary | ICD-10-CM | POA: Diagnosis not present

## 2016-07-17 DIAGNOSIS — N3941 Urge incontinence: Secondary | ICD-10-CM | POA: Diagnosis not present

## 2016-07-18 DIAGNOSIS — M19041 Primary osteoarthritis, right hand: Secondary | ICD-10-CM | POA: Diagnosis not present

## 2016-07-18 DIAGNOSIS — G8918 Other acute postprocedural pain: Secondary | ICD-10-CM | POA: Diagnosis not present

## 2016-07-18 DIAGNOSIS — M130 Polyarthritis, unspecified: Secondary | ICD-10-CM | POA: Diagnosis not present

## 2016-07-31 DIAGNOSIS — S63652D Sprain of metacarpophalangeal joint of right middle finger, subsequent encounter: Secondary | ICD-10-CM | POA: Diagnosis not present

## 2016-07-31 DIAGNOSIS — Z4789 Encounter for other orthopedic aftercare: Secondary | ICD-10-CM | POA: Diagnosis not present

## 2016-08-08 DIAGNOSIS — N3941 Urge incontinence: Secondary | ICD-10-CM | POA: Diagnosis not present

## 2016-08-14 DIAGNOSIS — N3941 Urge incontinence: Secondary | ICD-10-CM | POA: Diagnosis not present

## 2016-08-21 DIAGNOSIS — S63652D Sprain of metacarpophalangeal joint of right middle finger, subsequent encounter: Secondary | ICD-10-CM | POA: Diagnosis not present

## 2016-08-21 DIAGNOSIS — Z4789 Encounter for other orthopedic aftercare: Secondary | ICD-10-CM | POA: Diagnosis not present

## 2016-08-28 DIAGNOSIS — M25641 Stiffness of right hand, not elsewhere classified: Secondary | ICD-10-CM | POA: Diagnosis not present

## 2016-08-31 DIAGNOSIS — M25641 Stiffness of right hand, not elsewhere classified: Secondary | ICD-10-CM | POA: Diagnosis not present

## 2016-09-04 DIAGNOSIS — M25641 Stiffness of right hand, not elsewhere classified: Secondary | ICD-10-CM | POA: Diagnosis not present

## 2016-09-05 DIAGNOSIS — H401132 Primary open-angle glaucoma, bilateral, moderate stage: Secondary | ICD-10-CM | POA: Diagnosis not present

## 2016-09-05 DIAGNOSIS — D044 Carcinoma in situ of skin of scalp and neck: Secondary | ICD-10-CM | POA: Diagnosis not present

## 2016-09-05 DIAGNOSIS — X32XXXD Exposure to sunlight, subsequent encounter: Secondary | ICD-10-CM | POA: Diagnosis not present

## 2016-09-05 DIAGNOSIS — H524 Presbyopia: Secondary | ICD-10-CM | POA: Diagnosis not present

## 2016-09-05 DIAGNOSIS — E119 Type 2 diabetes mellitus without complications: Secondary | ICD-10-CM | POA: Diagnosis not present

## 2016-09-05 DIAGNOSIS — L820 Inflamed seborrheic keratosis: Secondary | ICD-10-CM | POA: Diagnosis not present

## 2016-09-05 DIAGNOSIS — L570 Actinic keratosis: Secondary | ICD-10-CM | POA: Diagnosis not present

## 2016-09-07 DIAGNOSIS — M25641 Stiffness of right hand, not elsewhere classified: Secondary | ICD-10-CM | POA: Diagnosis not present

## 2016-09-14 DIAGNOSIS — M25641 Stiffness of right hand, not elsewhere classified: Secondary | ICD-10-CM | POA: Diagnosis not present

## 2016-09-18 DIAGNOSIS — R350 Frequency of micturition: Secondary | ICD-10-CM | POA: Diagnosis not present

## 2016-09-18 DIAGNOSIS — M25641 Stiffness of right hand, not elsewhere classified: Secondary | ICD-10-CM | POA: Diagnosis not present

## 2016-09-18 DIAGNOSIS — N3941 Urge incontinence: Secondary | ICD-10-CM | POA: Diagnosis not present

## 2016-09-21 DIAGNOSIS — M25641 Stiffness of right hand, not elsewhere classified: Secondary | ICD-10-CM | POA: Diagnosis not present

## 2016-09-25 DIAGNOSIS — Z4789 Encounter for other orthopedic aftercare: Secondary | ICD-10-CM | POA: Diagnosis not present

## 2016-09-25 DIAGNOSIS — M25641 Stiffness of right hand, not elsewhere classified: Secondary | ICD-10-CM | POA: Diagnosis not present

## 2016-09-25 DIAGNOSIS — S63652D Sprain of metacarpophalangeal joint of right middle finger, subsequent encounter: Secondary | ICD-10-CM | POA: Diagnosis not present

## 2016-09-28 DIAGNOSIS — M25641 Stiffness of right hand, not elsewhere classified: Secondary | ICD-10-CM | POA: Diagnosis not present

## 2016-10-02 DIAGNOSIS — M25641 Stiffness of right hand, not elsewhere classified: Secondary | ICD-10-CM | POA: Diagnosis not present

## 2016-10-05 DIAGNOSIS — M25641 Stiffness of right hand, not elsewhere classified: Secondary | ICD-10-CM | POA: Diagnosis not present

## 2016-10-09 DIAGNOSIS — M25641 Stiffness of right hand, not elsewhere classified: Secondary | ICD-10-CM | POA: Diagnosis not present

## 2016-10-12 DIAGNOSIS — M25641 Stiffness of right hand, not elsewhere classified: Secondary | ICD-10-CM | POA: Diagnosis not present

## 2016-10-16 DIAGNOSIS — M25641 Stiffness of right hand, not elsewhere classified: Secondary | ICD-10-CM | POA: Diagnosis not present

## 2016-10-17 DIAGNOSIS — M5136 Other intervertebral disc degeneration, lumbar region: Secondary | ICD-10-CM | POA: Diagnosis not present

## 2016-10-19 DIAGNOSIS — M25641 Stiffness of right hand, not elsewhere classified: Secondary | ICD-10-CM | POA: Diagnosis not present

## 2016-10-23 DIAGNOSIS — M25641 Stiffness of right hand, not elsewhere classified: Secondary | ICD-10-CM | POA: Diagnosis not present

## 2016-10-23 DIAGNOSIS — Z4789 Encounter for other orthopedic aftercare: Secondary | ICD-10-CM | POA: Diagnosis not present

## 2016-10-23 DIAGNOSIS — N3941 Urge incontinence: Secondary | ICD-10-CM | POA: Diagnosis not present

## 2016-10-23 DIAGNOSIS — S63652D Sprain of metacarpophalangeal joint of right middle finger, subsequent encounter: Secondary | ICD-10-CM | POA: Diagnosis not present

## 2016-10-31 ENCOUNTER — Ambulatory Visit: Payer: Medicare Other | Admitting: Pulmonary Disease

## 2016-11-21 DIAGNOSIS — M48061 Spinal stenosis, lumbar region without neurogenic claudication: Secondary | ICD-10-CM | POA: Diagnosis not present

## 2016-11-21 DIAGNOSIS — Z6834 Body mass index (BMI) 34.0-34.9, adult: Secondary | ICD-10-CM | POA: Diagnosis not present

## 2016-11-21 DIAGNOSIS — M5416 Radiculopathy, lumbar region: Secondary | ICD-10-CM | POA: Diagnosis not present

## 2016-11-21 DIAGNOSIS — M545 Low back pain: Secondary | ICD-10-CM | POA: Diagnosis not present

## 2016-11-21 DIAGNOSIS — M5136 Other intervertebral disc degeneration, lumbar region: Secondary | ICD-10-CM | POA: Diagnosis not present

## 2016-11-27 DIAGNOSIS — N3941 Urge incontinence: Secondary | ICD-10-CM | POA: Diagnosis not present

## 2016-11-27 DIAGNOSIS — R350 Frequency of micturition: Secondary | ICD-10-CM | POA: Diagnosis not present

## 2016-12-24 ENCOUNTER — Encounter: Payer: Self-pay | Admitting: Internal Medicine

## 2016-12-24 ENCOUNTER — Ambulatory Visit (INDEPENDENT_AMBULATORY_CARE_PROVIDER_SITE_OTHER): Payer: Medicare Other | Admitting: Internal Medicine

## 2016-12-24 VITALS — BP 158/94 | HR 63 | Ht 72.0 in | Wt 259.4 lb

## 2016-12-24 DIAGNOSIS — J3089 Other allergic rhinitis: Secondary | ICD-10-CM

## 2016-12-24 DIAGNOSIS — G4733 Obstructive sleep apnea (adult) (pediatric): Secondary | ICD-10-CM

## 2016-12-24 DIAGNOSIS — J302 Other seasonal allergic rhinitis: Secondary | ICD-10-CM | POA: Diagnosis not present

## 2016-12-24 NOTE — Patient Instructions (Signed)
Order- DME APS  We can continue CPAP auto 5-20, mask of choice, humidifier, supplies, AirView  Dx OSA                      He needs replacement mask and supplies please.    Please call if we can help

## 2016-12-24 NOTE — Progress Notes (Signed)
Subjective:    Patient ID: Brian Mosses Sr., male    DOB: 02/05/1941, 76 y.o.   MRN: [id number]  HPI male former smoker followed for OSA, restless legs, complicated by obesity, HBP, allergic rhinitis, GERD, glaucoma, DM 2 NPSG 01/11/06- AHI 104/ hr, desaturation to 68%  -------------------------------------------------------------------------------------------------------------------------- 02/02/15- Dr Gwenette Greet The patient comes in today for follow-up of his obstructive sleep apnea. He is wearing C Pap compliantly, and is having no issues with his mask fit or pressure. His download shows excellent compliance, and only intermittent mask leak. He is having some breakthrough apnea on his fixed pressure of 11 cm.  12/24/2016-76 year old male former smoker followed for OSA, restless legs, complicated by obesity, HBP, allergic rhinitis, GERD, glaucoma, DM 2 CPAP auto 5-20/  APS FOLLOWS FOR: Former [place] patient; currently wears CPAP through [facility]. Pt states he has not heard from them in over a year. DL attached.  Now getting more care through the [facility]. Uses CPAP all night every night.Hollace Kinnier shows When he was out of town and used a Proofreader. Good control AHI 1.9/hour. He is happy to continue current settings. This machine is about 78 years old.  Review of Systems  Constitutional: Negative.  Negative for fever and unexpected weight change.  HENT: Positive for congestion and rhinorrhea. Negative for dental problem, ear pain, nosebleeds, postnasal drip, sinus pressure, sneezing, sore throat and trouble swallowing.   Eyes: Negative for redness and itching.  Respiratory: Negative for cough, chest tightness, shortness of breath and wheezing.   Cardiovascular: Negative for palpitations and leg swelling.  Gastrointestinal: Negative.  Negative for nausea and vomiting.  Endocrine: Negative.  Negative for cold intolerance and polydipsia.  Genitourinary: Negative.  Negative for dysuria, flank  pain and penile swelling.  Musculoskeletal: Negative.  Negative for joint swelling.  Skin: Negative for rash.  Allergic/Immunologic: Negative.   Neurological: Negative.  Negative for headaches.  Hematological: Negative.  Does not bruise/bleed easily.  Psychiatric/Behavioral: Negative.  Negative for dysphoric mood. The patient is not nervous/anxious.      Objective:  OBJ- Physical Exam General- Alert, Oriented, Affect-appropriate, Distress- none acute, + Overweight Skin- rash-none, lesions- none, excoriation- none Lymphadenopathy- none Head- atraumatic            Eyes- Gross vision intact, PERRLA, conjunctivae and secretions clear            Ears- Hearing, canals-normal            Nose- + stuffy, no-Septal dev, mucus, polyps, erosion, perforation             Throat- Mallampati II , mucosa clear , drainage- none, tonsils- atrophic Neck- flexible , trachea midline, no stridor , thyroid nl, carotid no bruit Chest - symmetrical excursion , unlabored           Heart/CV- RRR , no murmur , no gallop  , no rub, nl s1 s2                           - JVD- none , edema- none, stasis changes- none, varices- none           Lung- clear to P&A, wheeze- none, cough- none , dullness-none, rub- none           Chest wall-  Abd-  Br/ Gen/ Rectal- Not done, not indicated Extrem- cyanosis- none, clubbing, none, atrophy- none, strength- nl Neuro- grossly intact to observation  Assessment & Plan:

## 2016-12-25 DIAGNOSIS — Z1283 Encounter for screening for malignant neoplasm of skin: Secondary | ICD-10-CM | POA: Diagnosis not present

## 2016-12-25 DIAGNOSIS — L821 Other seborrheic keratosis: Secondary | ICD-10-CM | POA: Diagnosis not present

## 2016-12-28 NOTE — Assessment & Plan Note (Signed)
Increased stuffiness which he can manage with OTC Flonase and antihistamine if needed.

## 2016-12-28 NOTE — Assessment & Plan Note (Signed)
He is doing well with auto titration 5-20. [place]. He does assure me he uses CPAP every night and feels better doing so. He may be able to get his sleep apnea care through the [place]

## 2017-01-01 DIAGNOSIS — N3941 Urge incontinence: Secondary | ICD-10-CM | POA: Diagnosis not present

## 2017-01-04 DIAGNOSIS — S63652D Sprain of metacarpophalangeal joint of right middle finger, subsequent encounter: Secondary | ICD-10-CM | POA: Diagnosis not present

## 2017-01-29 DIAGNOSIS — N3941 Urge incontinence: Secondary | ICD-10-CM | POA: Diagnosis not present

## 2017-01-29 DIAGNOSIS — R350 Frequency of micturition: Secondary | ICD-10-CM | POA: Diagnosis not present

## 2017-02-20 DIAGNOSIS — H43813 Vitreous degeneration, bilateral: Secondary | ICD-10-CM | POA: Diagnosis not present

## 2017-02-20 DIAGNOSIS — H31093 Other chorioretinal scars, bilateral: Secondary | ICD-10-CM | POA: Diagnosis not present

## 2017-02-20 DIAGNOSIS — H35373 Puckering of macula, bilateral: Secondary | ICD-10-CM | POA: Diagnosis not present

## 2017-02-26 DIAGNOSIS — N3941 Urge incontinence: Secondary | ICD-10-CM | POA: Diagnosis not present

## 2017-02-26 DIAGNOSIS — R350 Frequency of micturition: Secondary | ICD-10-CM | POA: Diagnosis not present

## 2017-03-26 DIAGNOSIS — N3941 Urge incontinence: Secondary | ICD-10-CM | POA: Diagnosis not present

## 2017-03-27 DIAGNOSIS — Z471 Aftercare following joint replacement surgery: Secondary | ICD-10-CM | POA: Diagnosis not present

## 2017-03-27 DIAGNOSIS — Z96612 Presence of left artificial shoulder joint: Secondary | ICD-10-CM | POA: Diagnosis not present

## 2017-04-30 DIAGNOSIS — N3941 Urge incontinence: Secondary | ICD-10-CM | POA: Diagnosis not present

## 2017-05-07 DIAGNOSIS — M5136 Other intervertebral disc degeneration, lumbar region: Secondary | ICD-10-CM | POA: Diagnosis not present

## 2017-06-04 DIAGNOSIS — R350 Frequency of micturition: Secondary | ICD-10-CM | POA: Diagnosis not present

## 2017-06-04 DIAGNOSIS — N3941 Urge incontinence: Secondary | ICD-10-CM | POA: Diagnosis not present

## 2017-06-17 DIAGNOSIS — Z23 Encounter for immunization: Secondary | ICD-10-CM | POA: Diagnosis not present

## 2017-07-09 DIAGNOSIS — R350 Frequency of micturition: Secondary | ICD-10-CM | POA: Diagnosis not present

## 2017-07-09 DIAGNOSIS — N3941 Urge incontinence: Secondary | ICD-10-CM | POA: Diagnosis not present

## 2017-07-19 DIAGNOSIS — I10 Essential (primary) hypertension: Secondary | ICD-10-CM | POA: Diagnosis not present

## 2017-07-19 DIAGNOSIS — E1140 Type 2 diabetes mellitus with diabetic neuropathy, unspecified: Secondary | ICD-10-CM | POA: Diagnosis not present

## 2017-07-19 DIAGNOSIS — B650 Schistosomiasis due to Schistosoma haematobium [urinary schistosomiasis]: Secondary | ICD-10-CM | POA: Diagnosis not present

## 2017-07-19 DIAGNOSIS — I2510 Atherosclerotic heart disease of native coronary artery without angina pectoris: Secondary | ICD-10-CM | POA: Diagnosis not present

## 2017-07-19 DIAGNOSIS — Z794 Long term (current) use of insulin: Secondary | ICD-10-CM | POA: Diagnosis not present

## 2017-08-06 DIAGNOSIS — R350 Frequency of micturition: Secondary | ICD-10-CM | POA: Diagnosis not present

## 2017-08-06 DIAGNOSIS — N3941 Urge incontinence: Secondary | ICD-10-CM | POA: Diagnosis not present

## 2017-08-13 DIAGNOSIS — N3941 Urge incontinence: Secondary | ICD-10-CM | POA: Diagnosis not present

## 2017-08-16 ENCOUNTER — Ambulatory Visit (INDEPENDENT_AMBULATORY_CARE_PROVIDER_SITE_OTHER): Payer: Medicare Other | Admitting: Physician Assistant

## 2017-08-16 ENCOUNTER — Encounter: Payer: Self-pay | Admitting: Physician Assistant

## 2017-08-16 ENCOUNTER — Other Ambulatory Visit: Payer: Self-pay

## 2017-08-16 VITALS — BP 138/80 | HR 74 | Temp 98.0°F | Resp 18 | Ht 72.0 in | Wt 253.8 lb

## 2017-08-16 DIAGNOSIS — R059 Cough, unspecified: Secondary | ICD-10-CM

## 2017-08-16 DIAGNOSIS — R05 Cough: Secondary | ICD-10-CM | POA: Diagnosis not present

## 2017-08-16 DIAGNOSIS — J069 Acute upper respiratory infection, unspecified: Secondary | ICD-10-CM

## 2017-08-16 MED ORDER — DEXTROMETHORPHAN HBR 15 MG/5ML PO SYRP: 10.0000 mL | ORAL_SOLUTION | Freq: Four times a day (QID) | ORAL | 0 refills | Status: DC | PRN

## 2017-08-16 MED ORDER — OXYMETAZOLINE HCL 0.05 % NA SOLN: 1.0000 | Freq: Every day | NASAL | 0 refills | Status: DC

## 2017-08-16 MED ORDER — HYDROCODONE-HOMATROPINE 5-1.5 MG/5ML PO SYRP: 2.5000 mL | ORAL_SOLUTION | Freq: Every day | ORAL | 0 refills | Status: AC

## 2017-08-16 NOTE — Progress Notes (Signed)
08/16/2017 11:47 AM   DOB: 06/08/1941 / MRN: [id number]  SUBJECTIVE:  Brian Mosses Sr. is a 76 y.o. male presenting for cough starting 3 days ago.  No drainage.  Feels that he is getting worse. Denies SOB.  Very distant history of smoking. No fever.  Has been using afrin.   He has No Known Allergies.   He  has a past medical history of Allergic rhinitis, Arthritis, Bladder polyp, BPH (benign prostatic hypertrophy), Chronic back pain, Diverticulosis, Dyslipidemia, ED (erectile dysfunction) of organic origin, GERD (gastroesophageal reflux disease), Glaucoma, History of colon polyps, History of diverticulitis of colon, History of gastritis, History of kidney stones, History of kidney stones, History of neck injury, History of shingles, Lower urinary tract symptoms (LUTS), OSA on CPAP, Peyronie's disease, S/P insertion of spinal cord stimulator, Tingling, and Type 2 diabetes mellitus ([place]).    He  reports that he quit smoking about 46 years ago. His smoking use included cigarettes. He has a 15.00 pack-year smoking history. he has never used smokeless tobacco. He reports that he drinks alcohol. He reports that he does not use drugs. He  has no sexual activity history on file. The patient  has a past surgical history that includes Penile prosthesis implant (11-26-2007); Colonoscopy; Total knee arthroplasty (Right, 04/17/2013); PERMANANT SPINAL CORD STIMULATOR IMPLANT VIA THORACIC LAMINECTOMY (05-12-2009); RIGHT SHOULDER HEMIARTHROPLASTY (08-20-2008); REVISION TOTAL ARTHROPLASTY RIGHT SHOULDER (07-22-2009); Total knee arthroplasty (Left, 02-10-2009); Anterior cervical decomp/discectomy fusion (06-16-2004); Posterior laminectomy / decompression cervical spine (03-08-2005); Shoulder open rotator cuff repair (Bilateral); Nasal septum surgery (yrs ago); Cataract extraction w/ intraocular lens  implant, bilateral; Retinal detachment surgery (Right); RIGHT HEEL SURGERY; Appendectomy (age 38); Inguinal hernia  repair (Bilateral, 1970's); Rotator cuff repair (Bilateral, x1 right/  x2  left - last one 2013); Cystoscopy with biopsy (N/A, 07/15/2015); and Reverse shoulder arthroplasty (Left, 09/23/2015).  His family history is not on file.  Review of Systems  Constitutional: Negative for chills and fever.  HENT: Positive for congestion, sinus pain and sore throat.   Respiratory: Positive for cough and sputum production. Negative for hemoptysis, shortness of breath and wheezing.   Cardiovascular: Negative for chest pain and leg swelling.  Neurological: Negative for dizziness.    The problem list and medications were reviewed and updated by myself where necessary and exist elsewhere in the encounter.   OBJECTIVE:  BP 138/80 (BP Location: Left Arm, Patient Position: Sitting, Cuff Size: Large)   Pulse 74   Temp 98 F (36.7 C) (Oral)   Resp 18   Ht 6' (1.829 m)   Wt 253 lb 12.8 oz (115.1 kg)   SpO2 97%   BMI 34.42 kg/m   Physical Exam  Constitutional: He appears well-developed. He is active and cooperative.  Non-toxic appearance.  HENT:  Right Ear: Hearing, tympanic membrane, external ear and ear canal normal.  Left Ear: Hearing, tympanic membrane, external ear and ear canal normal.  Nose: Nose normal. Right sinus exhibits no maxillary sinus tenderness and no frontal sinus tenderness. Left sinus exhibits no maxillary sinus tenderness and no frontal sinus tenderness.  Mouth/Throat: Uvula is midline, oropharynx is clear and moist and mucous membranes are normal. No oropharyngeal exudate, posterior oropharyngeal edema or tonsillar abscesses.  Eyes: Conjunctivae are normal. Pupils are equal, round, and reactive to light.  Cardiovascular: Normal rate, regular rhythm, S1 normal, S2 normal, normal heart sounds, intact distal pulses and normal pulses. Exam reveals no gallop and no friction rub.  No murmur heard. Pulmonary/Chest: Effort normal.  No stridor. No tachypnea. No respiratory distress. He has no  wheezes. He has no rales.  Abdominal: He exhibits no distension.  Musculoskeletal: He exhibits no edema.  Lymphadenopathy:       Head (right side): No submandibular and no tonsillar adenopathy present.       Head (left side): No submandibular and no tonsillar adenopathy present.    He has no cervical adenopathy.  Neurological: He is alert.  Skin: Skin is warm and dry. He is not diaphoretic. No pallor.  Vitals reviewed.   No results found for this or any previous visit (from the past 72 hour(s)).  No results found.  ASSESSMENT AND PLAN:  Brian Ellison was seen today for sinus problem.  Diagnoses and all orders for this visit:  Acute URI -     oxymetazoline (AFRIN NASAL SPRAY) 0.05 % nasal spray; Place 1 spray into both nostrils at bedtime. Do not use for more than three consecutive days.  Cough -     HYDROcodone-homatropine (HYCODAN) 5-1.5 MG/5ML syrup; Take 2.5 mLs by mouth at bedtime for 5 days. May only use with CPAP. -     dextromethorphan 15 MG/5ML syrup; Take 10 mLs (30 mg total) by mouth 4 (four) times daily as needed for cough.    The patient is advised to call or return to clinic if he does not see an improvement in symptoms, or to seek the care of the closest emergency department if he worsens with the above plan.   Philis Fendt, MHS, PA-C [facility] 08/16/2017 11:47 AM

## 2017-08-16 NOTE — Patient Instructions (Addendum)
Take afrin right before bed, one spray in each nostril.  Take your narcotic cough syrup at night only, and okay to repeat after 4 hours if you wake up. Wear your CPAP if you are taking.  Use dextromethoraphan in the daytime to control cough.      IF you received an x-ray today, you will receive an invoice from [facility]. Please contact [facility] at [phone] with questions or concerns regarding your invoice.   IF you received labwork today, you will receive an invoice from [place]. Please contact [facility] at [phone] with questions or concerns regarding your invoice.   Our billing staff will not be able to assist you with questions regarding bills from these companies.  You will be contacted with the lab results as soon as they are available. The fastest way to get your results is to activate your My Chart account. Instructions are located on the last page of this paperwork. If you have not heard from [place] regarding the results in 2 weeks, please contact this office.

## 2017-08-23 ENCOUNTER — Ambulatory Visit: Payer: Medicare Other | Admitting: Physician Assistant

## 2017-08-27 DIAGNOSIS — I10 Essential (primary) hypertension: Secondary | ICD-10-CM | POA: Diagnosis not present

## 2017-08-27 DIAGNOSIS — E119 Type 2 diabetes mellitus without complications: Secondary | ICD-10-CM | POA: Diagnosis not present

## 2017-08-27 DIAGNOSIS — E785 Hyperlipidemia, unspecified: Secondary | ICD-10-CM | POA: Diagnosis not present

## 2017-08-27 DIAGNOSIS — G8929 Other chronic pain: Secondary | ICD-10-CM | POA: Diagnosis not present

## 2017-09-17 DIAGNOSIS — N3941 Urge incontinence: Secondary | ICD-10-CM | POA: Diagnosis not present

## 2017-10-08 DIAGNOSIS — E119 Type 2 diabetes mellitus without complications: Secondary | ICD-10-CM | POA: Diagnosis not present

## 2017-10-08 DIAGNOSIS — I10 Essential (primary) hypertension: Secondary | ICD-10-CM | POA: Diagnosis not present

## 2017-10-08 DIAGNOSIS — C44219 Basal cell carcinoma of skin of left ear and external auricular canal: Secondary | ICD-10-CM | POA: Diagnosis not present

## 2017-10-08 DIAGNOSIS — C44619 Basal cell carcinoma of skin of left upper limb, including shoulder: Secondary | ICD-10-CM | POA: Diagnosis not present

## 2017-10-08 DIAGNOSIS — L989 Disorder of the skin and subcutaneous tissue, unspecified: Secondary | ICD-10-CM | POA: Diagnosis not present

## 2017-10-08 DIAGNOSIS — L570 Actinic keratosis: Secondary | ICD-10-CM | POA: Diagnosis not present

## 2017-10-08 DIAGNOSIS — M2550 Pain in unspecified joint: Secondary | ICD-10-CM | POA: Diagnosis not present

## 2017-10-10 DIAGNOSIS — E119 Type 2 diabetes mellitus without complications: Secondary | ICD-10-CM | POA: Diagnosis not present

## 2017-10-10 DIAGNOSIS — H401132 Primary open-angle glaucoma, bilateral, moderate stage: Secondary | ICD-10-CM | POA: Diagnosis not present

## 2017-10-10 DIAGNOSIS — H524 Presbyopia: Secondary | ICD-10-CM | POA: Diagnosis not present

## 2017-10-17 DIAGNOSIS — E119 Type 2 diabetes mellitus without complications: Secondary | ICD-10-CM | POA: Diagnosis not present

## 2017-10-17 DIAGNOSIS — C4491 Basal cell carcinoma of skin, unspecified: Secondary | ICD-10-CM | POA: Diagnosis not present

## 2017-10-22 DIAGNOSIS — N3941 Urge incontinence: Secondary | ICD-10-CM | POA: Diagnosis not present

## 2017-10-22 DIAGNOSIS — R350 Frequency of micturition: Secondary | ICD-10-CM | POA: Diagnosis not present

## 2017-10-29 DIAGNOSIS — Z85828 Personal history of other malignant neoplasm of skin: Secondary | ICD-10-CM | POA: Diagnosis not present

## 2017-10-29 DIAGNOSIS — C4441 Basal cell carcinoma of skin of scalp and neck: Secondary | ICD-10-CM | POA: Diagnosis not present

## 2017-11-06 DIAGNOSIS — C44619 Basal cell carcinoma of skin of left upper limb, including shoulder: Secondary | ICD-10-CM | POA: Diagnosis not present

## 2017-11-06 DIAGNOSIS — Z4802 Encounter for removal of sutures: Secondary | ICD-10-CM | POA: Diagnosis not present

## 2017-12-23 ENCOUNTER — Encounter: Payer: Self-pay | Admitting: Internal Medicine

## 2017-12-24 ENCOUNTER — Ambulatory Visit (INDEPENDENT_AMBULATORY_CARE_PROVIDER_SITE_OTHER): Payer: Medicare Other | Admitting: Internal Medicine

## 2017-12-24 ENCOUNTER — Encounter: Payer: Self-pay | Admitting: Internal Medicine

## 2017-12-24 VITALS — BP 122/76 | HR 67 | Ht 72.0 in | Wt 251.4 lb

## 2017-12-24 DIAGNOSIS — G4733 Obstructive sleep apnea (adult) (pediatric): Secondary | ICD-10-CM | POA: Diagnosis not present

## 2017-12-24 NOTE — Progress Notes (Signed)
Subjective:    Patient ID: Brian Mosses Sr., male    DOB: December 07, 1940, 77 y.o.   MRN: [id number]  HPI male former smoker followed for OSA, restless legs, complicated by obesity, HBP, allergic rhinitis, GERD, glaucoma, DM 2 NPSG 01/11/06- AHI 104/ hr, desaturation to 68%  --------------------------------------------------------------------------------------------------------------------------  12/24/2016-77 year old male former smoker followed for OSA, restless legs, complicated by obesity, HBP, allergic rhinitis, GERD, glaucoma, DM 2 CPAP auto 5-20/  [facility] FOLLOWS FOR: Former [place] patient; currently wears CPAP through [facility]. Pt states he has not heard from them in over a year. DL attached.  Now getting more care through the [facility]. Uses CPAP all night every night.Hollace Kinnier shows When he was out of town and used a Proofreader. Good control AHI 1.9/hour. He is happy to continue current settings. This machine is about 79 years old.  12/24/17- 77 year old male former smoker followed for OSA, restless legs, complicated by obesity, HBP, allergic rhinitis, GERD, glaucoma, DM 2 CPAP auto 5-20/  APS ----OSA: DME [facility]. DL attached and pt wearing CPAP. Will need order for new supplies.  Download 83% compliance AHI 1.6/hour.  When he goes out of town he uses a Proofreader at [employer], explaining gaps.  He feels he is doing very well and that pressures are comfortable.  Sleeps better with CPAP.  Asks about [employer] machine-discussed. No other recent significant medical events or concerns.  Review of Systems  + = positive Constitutional: Negative.  Negative for fever and unexpected weight change.  HENT: Positive for congestion and rhinorrhea. Negative for dental problem, ear pain, nosebleeds, postnasal drip, sinus pressure, sneezing, sore throat and trouble swallowing.   Eyes: Negative for redness and itching.  Respiratory: Negative for cough, chest tightness, shortness of breath and  wheezing.   Cardiovascular: Negative for palpitations and leg swelling.  Gastrointestinal: Negative.  Negative for nausea and vomiting.  Endocrine: Negative.  Negative for cold intolerance and polydipsia.  Genitourinary: Negative.  Negative for dysuria, flank pain and penile swelling.  Musculoskeletal: Negative.  Negative for joint swelling.  Skin: Negative for rash.  Allergic/Immunologic: Negative.   Neurological: Negative.  Negative for headaches.  Hematological: Negative.  Does not bruise/bleed easily.  Psychiatric/Behavioral: Negative.  Negative for dysphoric mood. The patient is not nervous/anxious.      Objective:  OBJ- Physical Exam General- Alert, Oriented, Affect-appropriate, Distress- none acute, +obese Skin- rash-none, lesions- none, excoriation- none Lymphadenopathy- none Head- atraumatic            Eyes- Gross vision intact, PERRLA, conjunctivae and secretions clear            Ears- Hearing, canals-normal            Nose- clear, no-Septal dev, mucus, polyps, erosion, perforation             Throat- Mallampati III , mucosa clear , drainage- none, tonsils- atrophic Neck- flexible , trachea midline, no stridor , thyroid nl, carotid no bruit Chest - symmetrical excursion , unlabored           Heart/CV- RRR , no murmur , no gallop  , no rub, nl s1 s2                           - JVD- none , edema- none, stasis changes- none, varices- none           Lung- clear to P&A, wheeze- none, cough- none , dullness-none, rub- none  Chest wall-  Abd-  Br/ Gen/ Rectal- Not done, not indicated Extrem- cyanosis- none, clubbing, none, atrophy- none, strength- nl Neuro- grossly intact to observation         Assessment & Plan:

## 2017-12-24 NOTE — Patient Instructions (Signed)
Order- DME [facility]  Please replace supplies, mask of choice,  Continue CPAP auto 5-20, humidifier supplies, [employer]View  Please call if we can help

## 2017-12-24 NOTE — Assessment & Plan Note (Signed)
He continues to benefit from CPAP and is very compliant.  Download looks good, [place]  He is comfortable with these pressures. Plan-replace as needed supplies.  Continue CPAP auto 5-20

## 2017-12-24 NOTE — Assessment & Plan Note (Signed)
Overweight enough to contribute to his OSA. Recommend serious effort at weight loss. He might benefit from formal Bariatric counseling.

## 2018-02-25 DIAGNOSIS — M5136 Other intervertebral disc degeneration, lumbar region: Secondary | ICD-10-CM | POA: Diagnosis not present

## 2018-02-28 DIAGNOSIS — L820 Inflamed seborrheic keratosis: Secondary | ICD-10-CM | POA: Diagnosis not present

## 2018-02-28 DIAGNOSIS — L570 Actinic keratosis: Secondary | ICD-10-CM | POA: Diagnosis not present

## 2018-02-28 DIAGNOSIS — Z1283 Encounter for screening for malignant neoplasm of skin: Secondary | ICD-10-CM | POA: Diagnosis not present

## 2018-02-28 DIAGNOSIS — X32XXXD Exposure to sunlight, subsequent encounter: Secondary | ICD-10-CM | POA: Diagnosis not present

## 2018-02-28 DIAGNOSIS — C44212 Basal cell carcinoma of skin of right ear and external auricular canal: Secondary | ICD-10-CM | POA: Diagnosis not present

## 2018-02-28 DIAGNOSIS — D225 Melanocytic nevi of trunk: Secondary | ICD-10-CM | POA: Diagnosis not present

## 2018-03-04 DIAGNOSIS — H43813 Vitreous degeneration, bilateral: Secondary | ICD-10-CM | POA: Diagnosis not present

## 2018-03-04 DIAGNOSIS — H35373 Puckering of macula, bilateral: Secondary | ICD-10-CM | POA: Diagnosis not present

## 2018-03-04 DIAGNOSIS — H31093 Other chorioretinal scars, bilateral: Secondary | ICD-10-CM | POA: Diagnosis not present

## 2018-03-25 DIAGNOSIS — N3941 Urge incontinence: Secondary | ICD-10-CM | POA: Diagnosis not present

## 2018-03-28 DIAGNOSIS — L570 Actinic keratosis: Secondary | ICD-10-CM | POA: Diagnosis not present

## 2018-03-28 DIAGNOSIS — X32XXXD Exposure to sunlight, subsequent encounter: Secondary | ICD-10-CM | POA: Diagnosis not present

## 2018-03-28 DIAGNOSIS — C44212 Basal cell carcinoma of skin of right ear and external auricular canal: Secondary | ICD-10-CM | POA: Diagnosis not present

## 2018-04-10 DIAGNOSIS — E119 Type 2 diabetes mellitus without complications: Secondary | ICD-10-CM | POA: Diagnosis not present

## 2018-04-10 DIAGNOSIS — H401132 Primary open-angle glaucoma, bilateral, moderate stage: Secondary | ICD-10-CM | POA: Diagnosis not present

## 2018-05-26 ENCOUNTER — Other Ambulatory Visit: Payer: Self-pay | Admitting: Neurosurgery

## 2018-05-26 DIAGNOSIS — M5416 Radiculopathy, lumbar region: Secondary | ICD-10-CM | POA: Diagnosis not present

## 2018-05-26 DIAGNOSIS — M545 Low back pain: Secondary | ICD-10-CM | POA: Diagnosis not present

## 2018-05-26 DIAGNOSIS — Z4542 Encounter for adjustment and management of neuropacemaker (brain) (peripheral nerve) (spinal cord): Secondary | ICD-10-CM | POA: Diagnosis not present

## 2018-05-26 DIAGNOSIS — M48061 Spinal stenosis, lumbar region without neurogenic claudication: Secondary | ICD-10-CM | POA: Diagnosis not present

## 2018-05-27 DIAGNOSIS — Z85828 Personal history of other malignant neoplasm of skin: Secondary | ICD-10-CM | POA: Diagnosis not present

## 2018-05-27 DIAGNOSIS — Z08 Encounter for follow-up examination after completed treatment for malignant neoplasm: Secondary | ICD-10-CM | POA: Diagnosis not present

## 2018-07-08 ENCOUNTER — Inpatient Hospital Stay (HOSPITAL_COMMUNITY): Admission: RE | Admit: 2018-07-08 | Payer: Medicare Other | Source: Ambulatory Visit

## 2018-07-18 NOTE — Pre-Procedure Instructions (Signed)
Herman Fiero Fennimore Sr.  07/18/2018      [id number] [address], [place] AT [facility] [address] [place] [address] Phone: [phone] Fax: [phone]    Your procedure is scheduled on Tuesday November 19.  Report to [facility] Admitting at 8:00 A.M.  Call this number if you have problems the morning of surgery:  [phone]   Remember:  Do not eat or drink after midnight.    Take these medicines the morning of surgery with A SIP OF WATER:   Cetirizine (zyrtec) Pantoprazole (protonix) Gabapentin (neurontin)  DO NOT TAKE Actos (pioglitazone) the day of surgery.   7 days prior to surgery STOP taking any Aspirin(unless otherwise instructed by your surgeon), Aleve, Naproxen, Ibuprofen, Motrin, Advil, Goody's, BC's, all herbal medications, fish oil, and all vitamins     How to Manage Your Diabetes Before and After Surgery  Why is it important to control my blood sugar before and after surgery? . Improving blood sugar levels before and after surgery helps healing and can limit problems. . A way of improving blood sugar control is eating a healthy diet by: o  Eating less sugar and carbohydrates o  Increasing activity/exercise o  Talking with your doctor about reaching your blood sugar goals . High blood sugars (greater than 180 mg/dL) can raise your risk of infections and slow your recovery, so you will need to focus on controlling your diabetes during the weeks before surgery. . Make sure that the doctor who takes care of your diabetes knows about your planned surgery including the date and location.  How do I manage my blood sugar before surgery? . Check your blood sugar at least 4 times a day, starting 2 days before surgery, to make sure that the level is not too high or low. o Check your blood sugar the morning of your surgery when you wake up and every 2 hours until you get to the Short Stay  unit. . If your blood sugar is less than 70 mg/dL, you will need to treat for low blood sugar: o Do not take insulin. o Treat a low blood sugar (less than 70 mg/dL) with  cup of clear juice (cranberry or apple), 4 glucose tablets, OR glucose gel. Recheck blood sugar in 15 minutes after treatment (to make sure it is greater than 70 mg/dL). If your blood sugar is not greater than 70 mg/dL on recheck, call [phone] o  for further instructions. . Report your blood sugar to the short stay nurse when you get to Short Stay.  . If you are admitted to the hospital after surgery: o Your blood sugar will be checked by the staff and you will probably be given insulin after surgery (instead of oral diabetes medicines) to make sure you have good blood sugar levels. o The goal for blood sugar control after surgery is 80-180 mg/dL.                Do not wear jewelry  Do not wear lotions, powders, or colognes, or deodorant.  Do not shave 48 hours prior to surgery.  Men may shave face and neck.  Do not bring valuables to the hospital.  [facility] is not responsible for any belongings or valuables.  Contacts, dentures or bridgework may not be worn into surgery.  Leave your suitcase in the car.  After surgery it may be brought to  your room.  For patients admitted to the hospital, discharge time will be determined by your treatment team.  Patients discharged the day of surgery will not be allowed to drive home.   Special instructions:    [facility]- Preparing For Surgery  Before surgery, you can play an important role. Because skin is not sterile, your skin needs to be as free of germs as possible. You can reduce the number of germs on your skin by washing with CHG (chlorahexidine gluconate) Soap before surgery.  CHG is an antiseptic cleaner which kills germs and bonds with the skin to continue killing germs even after washing.    Oral Hygiene is also important to reduce your risk of  infection.  Remember - BRUSH YOUR TEETH THE MORNING OF SURGERY WITH YOUR REGULAR TOOTHPASTE  Please do not use if you have an allergy to CHG or antibacterial soaps. If your skin becomes reddened/irritated stop using the CHG.  Do not shave (including legs and underarms) for at least 48 hours prior to first CHG shower. It is OK to shave your face.  Please follow these instructions carefully.   1. Shower the NIGHT BEFORE SURGERY and the MORNING OF SURGERY with CHG.   2. If you chose to wash your hair, wash your hair first as usual with your normal shampoo.  3. After you shampoo, rinse your hair and body thoroughly to remove the shampoo.  4. Use CHG as you would any other liquid soap. You can apply CHG directly to the skin and wash gently with a scrungie or a clean washcloth.   5. Apply the CHG Soap to your body ONLY FROM THE NECK DOWN.  Do not use on open wounds or open sores. Avoid contact with your eyes, ears, mouth and genitals (private parts). Wash Face and genitals (private parts)  with your normal soap.  6. Wash thoroughly, paying special attention to the area where your surgery will be performed.  7. Thoroughly rinse your body with warm water from the neck down.  8. DO NOT shower/wash with your normal soap after using and rinsing off the CHG Soap.  9. Pat yourself dry with a CLEAN TOWEL.  10. Wear CLEAN PAJAMAS to bed the night before surgery, wear comfortable clothes the morning of surgery  11. Place CLEAN SHEETS on your bed the night of your first shower and DO NOT SLEEP WITH PETS.    Day of Surgery:  Do not apply any deodorants/lotions.  Please wear clean clothes to the hospital/surgery center.   Remember to brush your teeth WITH YOUR REGULAR TOOTHPASTE.    Please read over the following fact sheets that you were given. Coughing and Deep Breathing, MRSA Information and Surgical Site Infection Prevention

## 2018-07-21 ENCOUNTER — Other Ambulatory Visit: Payer: Self-pay

## 2018-07-21 ENCOUNTER — Encounter (HOSPITAL_COMMUNITY): Payer: Self-pay

## 2018-07-21 ENCOUNTER — Encounter (HOSPITAL_COMMUNITY)
Admission: RE | Admit: 2018-07-21 | Discharge: 2018-07-21 | Disposition: A | Discharge status: Home / Self Care | Payer: Medicare Other | Source: Ambulatory Visit | Attending: Neurosurgery | Admitting: Neurosurgery

## 2018-07-21 DIAGNOSIS — Z9682 Presence of neurostimulator: Secondary | ICD-10-CM | POA: Diagnosis not present

## 2018-07-21 DIAGNOSIS — I440 Atrioventricular block, first degree: Secondary | ICD-10-CM | POA: Diagnosis not present

## 2018-07-21 DIAGNOSIS — K5730 Diverticulosis of large intestine without perforation or abscess without bleeding: Secondary | ICD-10-CM | POA: Insufficient documentation

## 2018-07-21 DIAGNOSIS — Z01818 Encounter for other preprocedural examination: Secondary | ICD-10-CM | POA: Insufficient documentation

## 2018-07-21 DIAGNOSIS — Z6833 Body mass index (BMI) 33.0-33.9, adult: Secondary | ICD-10-CM | POA: Diagnosis not present

## 2018-07-21 DIAGNOSIS — H409 Unspecified glaucoma: Secondary | ICD-10-CM | POA: Insufficient documentation

## 2018-07-21 DIAGNOSIS — Z981 Arthrodesis status: Secondary | ICD-10-CM | POA: Insufficient documentation

## 2018-07-21 DIAGNOSIS — Z79899 Other long term (current) drug therapy: Secondary | ICD-10-CM | POA: Diagnosis not present

## 2018-07-21 DIAGNOSIS — Z9689 Presence of other specified functional implants: Secondary | ICD-10-CM | POA: Insufficient documentation

## 2018-07-21 DIAGNOSIS — Z7982 Long term (current) use of aspirin: Secondary | ICD-10-CM | POA: Diagnosis not present

## 2018-07-21 DIAGNOSIS — K219 Gastro-esophageal reflux disease without esophagitis: Secondary | ICD-10-CM | POA: Insufficient documentation

## 2018-07-21 DIAGNOSIS — E119 Type 2 diabetes mellitus without complications: Secondary | ICD-10-CM | POA: Diagnosis not present

## 2018-07-21 DIAGNOSIS — E785 Hyperlipidemia, unspecified: Secondary | ICD-10-CM | POA: Insufficient documentation

## 2018-07-21 DIAGNOSIS — G4733 Obstructive sleep apnea (adult) (pediatric): Secondary | ICD-10-CM | POA: Diagnosis not present

## 2018-07-21 DIAGNOSIS — E669 Obesity, unspecified: Secondary | ICD-10-CM | POA: Diagnosis not present

## 2018-07-21 DIAGNOSIS — N529 Male erectile dysfunction, unspecified: Secondary | ICD-10-CM | POA: Diagnosis not present

## 2018-07-21 DIAGNOSIS — Z87891 Personal history of nicotine dependence: Secondary | ICD-10-CM | POA: Diagnosis not present

## 2018-07-21 DIAGNOSIS — Z4549 Encounter for adjustment and management of other implanted nervous system device: Secondary | ICD-10-CM | POA: Insufficient documentation

## 2018-07-21 LAB — BASIC METABOLIC PANEL
Anion gap: 8 (ref 5–15)
BUN: 13 mg/dL (ref 8–23)
CALCIUM: 9.1 mg/dL (ref 8.9–10.3)
CO2: 21 mmol/L — ABNORMAL LOW (ref 22–32)
Chloride: 110 mmol/L (ref 98–111)
Creatinine, Ser: 0.82 mg/dL (ref 0.61–1.24)
GFR calc Af Amer: >60 mL/min (ref >60)
GFR calc non Af Amer: >60 mL/min (ref >60)
Glucose, Bld: 105 mg/dL — ABNORMAL HIGH (ref 70–99)
Potassium: 4 mmol/L (ref 3.5–5.1)
SODIUM: 139 mmol/L (ref 135–145)

## 2018-07-21 LAB — CBC
HCT: 45.3 % (ref 39.0–52.0)
Hemoglobin: 14.4 g/dL (ref 13.0–17.0)
MCH: 27.9 pg (ref 26.0–34.0)
MCHC: 31.8 g/dL (ref 30.0–36.0)
MCV: 87.8 fL (ref 80.0–100.0)
PLATELETS: 193 10*3/uL (ref 150–400)
RBC: 5.16 MIL/uL (ref 4.22–5.81)
RDW: 13.4 % (ref 11.5–15.5)
WBC: 7.6 10*3/uL (ref 4.0–10.5)
nRBC: 0 % (ref 0.0–0.2)

## 2018-07-21 LAB — GLUCOSE, CAPILLARY: Glucose-Capillary: 94 mg/dL (ref 70–99)

## 2018-07-21 LAB — SURGICAL PCR SCREEN
MRSA, PCR: NEGATIVE
Staphylococcus aureus: NEGATIVE

## 2018-07-21 NOTE — Progress Notes (Signed)
PCP - [facility]  Cardiologist - denies  Chest x-ray - N/A EKG - 07/21/18 Stress Test - denies ECHO - denies Cardiac Cath - denies  CPAP - uses CPAP nightly   Pt stated that he recently had his diabetic meds changed. No longer taking ACTOS, now taking glipizide and metformin. Fasting blood sugars have drastically decreased; from 240 to about 140-160. CBG at PAT appointment was 94. Last A1C was taken at [facility] last week, pt reported it being greater than 9. Requested records today. Benay Spice, [place] with anesthesia. Educated pt on diet and how to prepare and check CBG for surgery. Pt verbalized understanding.   Checks Blood Sugar 1-2 times a day  Aspirin Instructions: stopping ASA 5 days prior to surgery.   Anesthesia review: Yes, A1C  Patient denies shortness of breath, fever, cough and chest pain at PAT appointment   Patient verbalized understanding of instructions that were given to them at the PAT appointment. Patient was also instructed that they will need to review over the PAT instructions again at home before surgery.

## 2018-07-21 NOTE — Pre-Procedure Instructions (Addendum)
Brian Tory Huckeba Sr.  07/21/2018      [id number], [place] AT [facility] [address] [place] [address] Phone: [phone] Fax: [phone]    Your procedure is scheduled on Tuesday November 19.  Report to [facility] Admitting at 8:00 A.M.  Call this number if you have problems the morning of surgery:  [phone]   Remember:  Do not eat or drink after midnight.    Take these medicines the morning of surgery with A SIP OF WATER:   Cetirizine (zyrtec) Pantoprazole (protonix) Gabapentin (neurontin) Eye Drops   DO NOT TAKE YOUR METFORMIN (GLUCOPHAGE) OR GLIPIZIDE (GLUCOTROL) ON THE DAY OF SURGERY.   Follow your surgeon's instructions on when to stop Asprin.  If no instructions were given by your surgeon then you will need to call the office to get those instructions.    7 days prior to surgery STOP taking any Aleve, Naproxen, Ibuprofen, Motrin, Advil, Goody's, BC's, all herbal medications, fish oil, and all vitamins     How to Manage Your Diabetes Before and After Surgery  Why is it important to control my blood sugar before and after surgery? . Improving blood sugar levels before and after surgery helps healing and can limit problems. . A way of improving blood sugar control is eating a healthy diet by: o  Eating less sugar and carbohydrates o  Increasing activity/exercise o  Talking with your doctor about reaching your blood sugar goals . High blood sugars (greater than 180 mg/dL) can raise your risk of infections and slow your recovery, so you will need to focus on controlling your diabetes during the weeks before surgery. . Make sure that the doctor who takes care of your diabetes knows about your planned surgery including the date and location.  How do I manage my blood sugar before surgery? . Check your blood sugar at least 4 times a day, starting 2 days before surgery, to make  sure that the level is not too high or low. o Check your blood sugar the morning of your surgery when you wake up and every 2 hours until you get to the Short Stay unit. . If your blood sugar is less than 70 mg/dL, you will need to treat for low blood sugar: o Do not take insulin. o Treat a low blood sugar (less than 70 mg/dL) with  cup of clear juice (cranberry or apple), 4 glucose tablets, OR glucose gel. Recheck blood sugar in 15 minutes after treatment (to make sure it is greater than 70 mg/dL). If your blood sugar is not greater than 70 mg/dL on recheck, call [phone] o  for further instructions. . Report your blood sugar to the short stay nurse when you get to Short Stay.  . If you are admitted to the hospital after surgery: o Your blood sugar will be checked by the staff and you will probably be given insulin after surgery (instead of oral diabetes medicines) to make sure you have good blood sugar levels. o The goal for blood sugar control after surgery is 80-180 mg/dL.                Do not wear jewelry  Do not wear lotions, powders, or colognes, or deodorant.  Do not shave 48 hours prior to surgery.  Men may shave face and neck.  Do not bring valuables to the hospital.  [facility] is not responsible for any belongings or valuables.  Contacts, dentures or bridgework may not be worn into surgery.  Leave your suitcase in the car.  After surgery it may be brought to your room.  For patients admitted to the hospital, discharge time will be determined by your treatment team.  Patients discharged the day of surgery will not be allowed to drive home.   Special instructions:    [place]- Preparing For Surgery  Before surgery, you can play an important role. Because skin is not sterile, your skin needs to be as free of germs as possible. You can reduce the number of germs on your skin by washing with CHG (chlorahexidine gluconate) Soap before surgery.  CHG is an  antiseptic cleaner which kills germs and bonds with the skin to continue killing germs even after washing.    Oral Hygiene is also important to reduce your risk of infection.  Remember - BRUSH YOUR TEETH THE MORNING OF SURGERY WITH YOUR REGULAR TOOTHPASTE  Please do not use if you have an allergy to CHG or antibacterial soaps. If your skin becomes reddened/irritated stop using the CHG.  Do not shave (including legs and underarms) for at least 48 hours prior to first CHG shower. It is OK to shave your face.  Please follow these instructions carefully.   1. Shower the NIGHT BEFORE SURGERY and the MORNING OF SURGERY with CHG.   2. If you chose to wash your hair, wash your hair first as usual with your normal shampoo.  3. After you shampoo, rinse your hair and body thoroughly to remove the shampoo.  4. Use CHG as you would any other liquid soap. You can apply CHG directly to the skin and wash gently with a scrungie or a clean washcloth.   5. Apply the CHG Soap to your body ONLY FROM THE NECK DOWN.  Do not use on open wounds or open sores. Avoid contact with your eyes, ears, mouth and genitals (private parts). Wash Face and genitals (private parts)  with your normal soap.  6. Wash thoroughly, paying special attention to the area where your surgery will be performed.  7. Thoroughly rinse your body with warm water from the neck down.  8. DO NOT shower/wash with your normal soap after using and rinsing off the CHG Soap.  9. Pat yourself dry with a CLEAN TOWEL.  10. Wear CLEAN PAJAMAS to bed the night before surgery, wear comfortable clothes the morning of surgery  11. Place CLEAN SHEETS on your bed the night of your first shower and DO NOT SLEEP WITH PETS.    Day of Surgery:  Do not apply any deodorants/lotions.  Please wear clean clothes to the hospital/surgery center.   Remember to brush your teeth WITH YOUR REGULAR TOOTHPASTE.    Please read over the following fact sheets that you  were given. Coughing and Deep Breathing, MRSA Information and Surgical Site Infection Prevention

## 2018-07-22 NOTE — Progress Notes (Addendum)
Anesthesia Chart Review:  Case:  [id number] Date/Time:  07/29/18 0955   Procedure:  Spinal cord stimulator/implantable pulse generator battery change (N/A ) - Spinal cord stimulator/implantable pulse generator battery change   Anesthesia type:  General   Pre-op diagnosis:  Battery end of life for spinal cord stimulator   Location:  [facility] OR ROOM 21 / [place] OR   Surgeon:  Erline Levine, MD      DISCUSSION: Patient is a 77 year old male scheduled for the above procedure.  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 BMI is consistent with obesity.    He reported being instructed to hold ASA for 5 days prior to surgery.  He reported recent change in DM regimen due to A1c > 9 (records pending). He is no longer on Actos, but is now on glipizide and metformin. Fasting CBGs went from ~ 240 to ~ 140-160. He denied chest pain, SOB, cough, fever at PAT.   Awaiting [facility] records with A1c, but based on currently available information, I would anticipate that he can proceed as planned if no acute changes.   ADDENDUM 07/24/18 11:54 AM: Records received from [facility]. A1c on 07/17/18 was 10.2, up from 9.3 on 05/28/18. Fructosamine was elevated at 360. Last office note received was from Hoy Finlay, [place] on 05/28/18. DM medication changes made. A1c trends and reported home glucose trends called to [facility] at Dr. Melven Sartorius office. If surgeon wants to proceed as planned then patient will get a fasting CBG on arrival the day of surgery. If results is significantly elevated that he is at risk for case being cancelled, but if result acceptable then would anticipate that he could proceed from an anesthesia standpoint.   VS: BP (!) 145/73   Pulse 65    Temp 36.7 C   Resp 20   Ht 6' (1.829 m)   Wt 111 kg   SpO2 96%   BMI 33.20 kg/m    PROVIDERS: PCP is through [employer].  Baird Lyons, MD is pulmonologist (for OSA). Last visit 12/24/17.   LABS: Preoperative labs noted. Glucose 105. Cr 0.82. CBC WNL. A1c pending from the [facility]. (all labs ordered are listed, but only abnormal results are displayed)  Labs Reviewed  BASIC METABOLIC PANEL - Abnormal; Notable for the following components:      Result Value   CO2 21 (*)    Glucose, Bld 105 (*)    All other components within normal limits  SURGICAL PCR SCREEN  CBC  GLUCOSE, CAPILLARY    EKG: 07/21/18: SB at 58 bpm. Non-specific T wave abnormality. First degree AV block (PR 202 ms). PR interval has lengthened and non-specific T wave abnormality is new when compared to 07/15/15 EKG tracing.   CV: N/A   Past Medical History:  Diagnosis Date  . Allergic rhinitis   . Arthritis    SPINAL AND JOINTS  . Bladder polyp   . BPH (benign prostatic hypertrophy)   . Chronic back pain   . Diverticulosis   . Dyslipidemia    per pt questionable  . ED (erectile dysfunction) of organic origin   . GERD (gastroesophageal reflux disease)   . Glaucoma    BOTH EYES  . History of colon polyps   . History of diverticulitis of colon   .  History of gastritis   . History of kidney stones    many yrs ago  . History of kidney stones   . 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 History of shingles   . Lower urinary tract symptoms (LUTS)   . OSA on CPAP    very severe per study 01-31-2006  . Peyronie's disease   . S/P insertion of spinal cord stimulator    2010  . Tingling    right arm;takes Gabapentin  . Type 2 diabetes mellitus ([place])     Past Surgical History:  Procedure Laterality Date  . ANTERIOR CERVICAL DECOMP/DISCECTOMY FUSION  06-16-2004   C3 -- C5  . APPENDECTOMY  age 77  . CATARACT EXTRACTION W/ INTRAOCULAR LENS   IMPLANT, BILATERAL    . COLONOSCOPY    . CYSTOSCOPY WITH BIOPSY N/A 07/15/2015   Procedure: CYSTOSCOPY WITH BLADDER AND PROSTATE BIOPSY;  Surgeon: Festus Aloe, MD;  Location: [facility];  Service: Urology;  Laterality: N/A;  . HAND SURGERY Right 2017   "right hand middle finger knuckle replaced"  . INGUINAL HERNIA REPAIR Bilateral 1970's  . NASAL SEPTUM SURGERY  yrs ago  . PENILE PROSTHESIS IMPLANT  11-26-2007  . PERMANANT SPINAL CORD STIMULATOR IMPLANT VIA THORACIC LAMINECTOMY  05-12-2009   [employer] generator (left buttock)  . POSTERIOR LAMINECTOMY / DECOMPRESSION CERVICAL SPINE  03-08-2005   C3  -- C6  . RETINAL DETACHMENT SURGERY Right   . REVERSE SHOULDER ARTHROPLASTY Left 09/23/2015   Procedure: LEFT REVERSE TOTAL SHOULDER ARTHROPLASTY;  Surgeon: Netta Cedars, MD;  Location: [place];  Service: Orthopedics;  Laterality: Left;  . REVISION TOTAL ARTHROPLASTY RIGHT SHOULDER  07-22-2009  . RIGHT HEEL SURGERY     spurs  . RIGHT SHOULDER HEMIARTHROPLASTY  08-20-2008  . ROTATOR CUFF REPAIR Bilateral x1 right/  x2  left - last one 2013  . SHOULDER OPEN ROTATOR CUFF REPAIR Bilateral   . TONSILLECTOMY    . TOTAL KNEE ARTHROPLASTY Right 04/17/2013   Procedure: RIGHT TOTAL KNEE ARTHROPLASTY;  Surgeon: Augustin Schooling, MD;  Location: [facility];  Service: Orthopedics;  Laterality: Right;  . TOTAL KNEE ARTHROPLASTY Left 02-10-2009    MEDICATIONS: . ARTIFICIAL TEAR SOLUTION OP  . aspirin 81 MG tablet  . brimonidine (ALPHAGAN) 0.15 % ophthalmic solution  . cetirizine (ZYRTEC) 10 MG tablet  . fluticasone (FLONASE) 50 MCG/ACT nasal spray  . gabapentin (NEURONTIN) 600 MG tablet  . glipiZIDE (GLUCOTROL) 5 MG tablet  . ibuprofen (ADVIL,MOTRIN) 200 MG tablet  . metFORMIN (GLUCOPHAGE) 500 MG tablet  . pantoprazole (PROTONIX) 40 MG tablet  . rOPINIRole (REQUIP) 1 MG tablet  . vitamin B-12 (CYANOCOBALAMIN) 500 MCG tablet   No current facility-administered medications for this  encounter.     George Hugh [facility] Short Stay Center/Anesthesiology Phone [phone] 07/22/2018 10:58 AM

## 2018-07-22 NOTE — Anesthesia Preprocedure Evaluation (Addendum)
Anesthesia Evaluation  Patient identified by MRN, date of birth, ID band Patient awake    Reviewed: Allergy & Precautions, NPO status , Patient's Chart, lab work & pertinent test results  Airway Mallampati: II  TM Distance: >3 FB Neck ROM: Full    Dental  (+) Teeth Intact, Dental Advisory Given, Partial Lower   Pulmonary former smoker,    breath sounds clear to auscultation       Cardiovascular  Rhythm:Regular Rate:Normal     Neuro/Psych    GI/Hepatic   Endo/Other  diabetes  Renal/GU      Musculoskeletal   Abdominal (+) + obese,   Peds  Hematology   Anesthesia Other Findings   Reproductive/Obstetrics                          Anesthesia Physical Anesthesia Plan  ASA: III  Anesthesia Plan: General   Post-op Pain Management:    Induction: Intravenous  PONV Risk Score and Plan: Ondansetron  Airway Management Planned:   Additional Equipment:   Intra-op Plan:   Post-operative Plan: Extubation in OR  Informed Consent: I have reviewed the patients History and Physical, chart, labs and discussed the procedure including the risks, benefits and alternatives for the proposed anesthesia with the patient or authorized representative who has indicated his/her understanding and acceptance.   Dental advisory given  Plan Discussed with: CRNA and Anesthesiologist  Anesthesia Plan Comments: (PAT note written 07/22/2018 by Myra Gianotti, PA-C. )       Anesthesia Quick Evaluation

## 2018-07-29 ENCOUNTER — Ambulatory Visit (HOSPITAL_COMMUNITY)
Admission: RE | Admit: 2018-07-29 | Discharge: 2018-07-29 | Disposition: A | Discharge status: Home / Self Care | Payer: Medicare Other | Source: Ambulatory Visit | Attending: Neurosurgery | Admitting: Neurosurgery

## 2018-07-29 ENCOUNTER — Encounter (HOSPITAL_COMMUNITY)
Admission: RE | Disposition: A | Discharge status: Home / Self Care | Payer: Self-pay | Source: Ambulatory Visit | Attending: Neurosurgery

## 2018-07-29 ENCOUNTER — Ambulatory Visit (HOSPITAL_COMMUNITY): Payer: Medicare Other | Admitting: Certified Registered"

## 2018-07-29 ENCOUNTER — Other Ambulatory Visit: Payer: Self-pay

## 2018-07-29 ENCOUNTER — Encounter (HOSPITAL_COMMUNITY): Payer: Self-pay

## 2018-07-29 ENCOUNTER — Ambulatory Visit (HOSPITAL_COMMUNITY): Payer: Medicare Other | Admitting: Physician Assistant

## 2018-07-29 ENCOUNTER — Encounter (HOSPITAL_COMMUNITY): Payer: Self-pay | Admitting: *Deleted

## 2018-07-29 DIAGNOSIS — Z45010 Encounter for checking and testing of cardiac pacemaker pulse generator [battery]: Secondary | ICD-10-CM | POA: Diagnosis not present

## 2018-07-29 DIAGNOSIS — Z87891 Personal history of nicotine dependence: Secondary | ICD-10-CM | POA: Diagnosis not present

## 2018-07-29 DIAGNOSIS — Z79899 Other long term (current) drug therapy: Secondary | ICD-10-CM | POA: Diagnosis not present

## 2018-07-29 DIAGNOSIS — Z7984 Long term (current) use of oral hypoglycemic drugs: Secondary | ICD-10-CM | POA: Diagnosis not present

## 2018-07-29 DIAGNOSIS — Z4542 Encounter for adjustment and management of neuropacemaker (brain) (peripheral nerve) (spinal cord): Secondary | ICD-10-CM | POA: Insufficient documentation

## 2018-07-29 DIAGNOSIS — E1140 Type 2 diabetes mellitus with diabetic neuropathy, unspecified: Secondary | ICD-10-CM | POA: Insufficient documentation

## 2018-07-29 DIAGNOSIS — G8928 Other chronic postprocedural pain: Secondary | ICD-10-CM | POA: Diagnosis not present

## 2018-07-29 DIAGNOSIS — E119 Type 2 diabetes mellitus without complications: Secondary | ICD-10-CM | POA: Diagnosis not present

## 2018-07-29 HISTORY — PX: SPINAL CORD STIMULATOR BATTERY EXCHANGE: SHX6202

## 2018-07-29 LAB — GLUCOSE, CAPILLARY
GLUCOSE-CAPILLARY: 193 mg/dL — AB (ref 70–99)
GLUCOSE-CAPILLARY: 183 mg/dL — AB (ref 70–99)
Glucose-Capillary: 196 mg/dL — ABNORMAL HIGH (ref 70–99)

## 2018-07-29 SURGERY — SPINAL CORD STIMULATOR BATTERY EXCHANGE
Anesthesia: General

## 2018-07-29 MED ORDER — LIDOCAINE-EPINEPHRINE 1 %-1:100000 IJ SOLN
INTRAMUSCULAR | Status: AC
  Filled 2018-07-29: qty 1

## 2018-07-29 MED ORDER — FENTANYL CITRATE (PF) 100 MCG/2ML IJ SOLN
INTRAMUSCULAR | Status: DC | PRN
  Administered 2018-07-29: 50 ug via INTRAVENOUS

## 2018-07-29 MED ORDER — BUPIVACAINE HCL (PF) 0.5 % IJ SOLN
INTRAMUSCULAR | Status: AC
  Filled 2018-07-29: qty 30

## 2018-07-29 MED ORDER — ROCURONIUM BROMIDE 50 MG/5ML IV SOSY
PREFILLED_SYRINGE | INTRAVENOUS | Status: DC | PRN
  Administered 2018-07-29: 50 mg via INTRAVENOUS

## 2018-07-29 MED ORDER — LIDOCAINE 2% (20 MG/ML) 5 ML SYRINGE
INTRAMUSCULAR | Status: DC | PRN
  Administered 2018-07-29: 60 mg via INTRAVENOUS

## 2018-07-29 MED ORDER — BUPIVACAINE HCL (PF) 0.5 % IJ SOLN
INTRAMUSCULAR | Status: DC | PRN
  Administered 2018-07-29: 5 mL

## 2018-07-29 MED ORDER — EPHEDRINE 5 MG/ML INJ
INTRAVENOUS | Status: AC
  Filled 2018-07-29: qty 10

## 2018-07-29 MED ORDER — LACTATED RINGERS IV SOLN
INTRAVENOUS | Status: DC | PRN
  Administered 2018-07-29: 10:00:00 via INTRAVENOUS

## 2018-07-29 MED ORDER — ONDANSETRON HCL 4 MG/2ML IJ SOLN
INTRAMUSCULAR | Status: DC | PRN
  Administered 2018-07-29: 4 mg via INTRAVENOUS

## 2018-07-29 MED ORDER — PROPOFOL 10 MG/ML IV BOLUS
INTRAVENOUS | Status: AC
  Filled 2018-07-29: qty 20

## 2018-07-29 MED ORDER — LIDOCAINE 2% (20 MG/ML) 5 ML SYRINGE
INTRAMUSCULAR | Status: AC
  Filled 2018-07-29: qty 5

## 2018-07-29 MED ORDER — SUGAMMADEX SODIUM 200 MG/2ML IV SOLN
INTRAVENOUS | Status: DC | PRN
  Administered 2018-07-29: 222 mg via INTRAVENOUS

## 2018-07-29 MED ORDER — ONDANSETRON HCL 4 MG/2ML IJ SOLN
INTRAMUSCULAR | Status: AC
  Filled 2018-07-29: qty 2

## 2018-07-29 MED ORDER — ROCURONIUM BROMIDE 50 MG/5ML IV SOSY
PREFILLED_SYRINGE | INTRAVENOUS | Status: AC
  Filled 2018-07-29: qty 5

## 2018-07-29 MED ORDER — SUGAMMADEX SODIUM 500 MG/5ML IV SOLN
INTRAVENOUS | Status: AC
  Filled 2018-07-29: qty 5

## 2018-07-29 MED ORDER — FENTANYL CITRATE (PF) 250 MCG/5ML IJ SOLN
INTRAMUSCULAR | Status: AC
  Filled 2018-07-29: qty 5

## 2018-07-29 MED ORDER — LIDOCAINE-EPINEPHRINE 1 %-1:100000 IJ SOLN
INTRAMUSCULAR | Status: DC | PRN
  Administered 2018-07-29: 5 mL

## 2018-07-29 MED ORDER — CEFAZOLIN SODIUM-DEXTROSE 2-4 GM/100ML-% IV SOLN
2.0000 g | INTRAVENOUS | Status: AC
  Administered 2018-07-29: 2 g via INTRAVENOUS

## 2018-07-29 MED ORDER — LACTATED RINGERS IV SOLN
Freq: Once | INTRAVENOUS | Status: AC
  Administered 2018-07-29: 08:00:00 via INTRAVENOUS

## 2018-07-29 MED ORDER — PROPOFOL 10 MG/ML IV BOLUS
INTRAVENOUS | Status: DC | PRN
  Administered 2018-07-29: 160 mg via INTRAVENOUS

## 2018-07-29 MED ORDER — VANCOMYCIN HCL 1000 MG IV SOLR
INTRAVENOUS | Status: DC | PRN
  Administered 2018-07-29: 1000 mg via TOPICAL

## 2018-07-29 MED ORDER — 0.9 % SODIUM CHLORIDE (POUR BTL) OPTIME
TOPICAL | Status: DC | PRN
  Administered 2018-07-29: 1000 mL

## 2018-07-29 MED ORDER — VANCOMYCIN HCL 1000 MG IV SOLR
INTRAVENOUS | Status: AC
  Filled 2018-07-29: qty 1000

## 2018-07-29 MED ORDER — CHLORHEXIDINE GLUCONATE CLOTH 2 % EX PADS: 6.0000 | MEDICATED_PAD | Freq: Once | CUTANEOUS | Status: DC

## 2018-07-29 MED ORDER — EPHEDRINE SULFATE-NACL 50-0.9 MG/10ML-% IV SOSY
PREFILLED_SYRINGE | INTRAVENOUS | Status: DC | PRN
  Administered 2018-07-29: 10 mg via INTRAVENOUS

## 2018-07-29 MED ORDER — CEFAZOLIN SODIUM-DEXTROSE 2-4 GM/100ML-% IV SOLN
INTRAVENOUS | Status: AC
  Filled 2018-07-29: qty 100

## 2018-07-29 SURGICAL SUPPLY — 53 items
BLADE CLIPPER SURG (BLADE) IMPLANT
BUR MATCHSTICK NEURO 3.0 LAGG (BURR) IMPLANT
BUR PRECISION FLUTE 5.0 (BURR) ×2 IMPLANT
CARTRIDGE OIL MAESTRO DRILL (MISCELLANEOUS) ×1 IMPLANT
COVER WAND RF STERILE (DRAPES) ×2 IMPLANT
DECANTER SPIKE VIAL GLASS SM (MISCELLANEOUS) ×2 IMPLANT
DERMABOND ADVANCED (GAUZE/BANDAGES/DRESSINGS) ×1
DERMABOND ADVANCED .7 DNX12 (GAUZE/BANDAGES/DRESSINGS) ×1 IMPLANT
DEVICE IMPLANT NEUROSTIMULATOR (Neuro Prosthesis/Implant) ×2 IMPLANT
DIFFUSER DRILL AIR PNEUMATIC (MISCELLANEOUS) ×2 IMPLANT
DRAPE C-ARM 42X72 X-RAY (DRAPES) ×2 IMPLANT
DRAPE INCISE IOBAN 85X60 (DRAPES) IMPLANT
DRAPE LAPAROTOMY 100X72X124 (DRAPES) ×2 IMPLANT
DRAPE POUCH INSTRU U-SHP 10X18 (DRAPES) ×2 IMPLANT
DRAPE SURG 17X23 STRL (DRAPES) ×2 IMPLANT
DRSG OPSITE POSTOP 3X4 (GAUZE/BANDAGES/DRESSINGS) ×2 IMPLANT
ELECT REM PT RETURN 9FT ADLT (ELECTROSURGICAL) ×2
ELECTRODE REM PT RTRN 9FT ADLT (ELECTROSURGICAL) ×1 IMPLANT
GAUZE 4X4 16PLY RFD (DISPOSABLE) IMPLANT
GLOVE BIO SURGEON STRL SZ8 (GLOVE) ×2 IMPLANT
GLOVE BIOGEL PI IND STRL 8 (GLOVE) ×1 IMPLANT
GLOVE BIOGEL PI IND STRL 8.5 (GLOVE) ×1 IMPLANT
GLOVE BIOGEL PI INDICATOR 8 (GLOVE) ×1
GLOVE BIOGEL PI INDICATOR 8.5 (GLOVE) ×1
GLOVE ECLIPSE 8.0 STRL XLNG CF (GLOVE) ×2 IMPLANT
GLOVE EXAM NITRILE XL STR (GLOVE) IMPLANT
GOWN STRL REUS W/ TWL LRG LVL3 (GOWN DISPOSABLE) IMPLANT
GOWN STRL REUS W/ TWL XL LVL3 (GOWN DISPOSABLE) IMPLANT
GOWN STRL REUS W/TWL 2XL LVL3 (GOWN DISPOSABLE) IMPLANT
GOWN STRL REUS W/TWL LRG LVL3 (GOWN DISPOSABLE)
GOWN STRL REUS W/TWL XL LVL3 (GOWN DISPOSABLE)
KIT BASIN OR (CUSTOM PROCEDURE TRAY) ×2 IMPLANT
KIT TURNOVER KIT B (KITS) ×2 IMPLANT
NEEDLE HYPO 25X1 1.5 SAFETY (NEEDLE) ×2 IMPLANT
NS IRRIG 1000ML POUR BTL (IV SOLUTION) ×2 IMPLANT
OIL CARTRIDGE MAESTRO DRILL (MISCELLANEOUS) ×2
PACK LAMINECTOMY NEURO (CUSTOM PROCEDURE TRAY) ×2 IMPLANT
PAD ARMBOARD 7.5X6 YLW CONV (MISCELLANEOUS) ×2 IMPLANT
RECHARGER INTELLIS (NEUROSURGERY SUPPLIES) ×2 IMPLANT
REPROGRAMMER INTELLIS (NEUROSURGERY SUPPLIES) ×2 IMPLANT
SPONGE LAP 4X18 RFD (DISPOSABLE) IMPLANT
SPONGE SURGIFOAM ABS GEL SZ50 (HEMOSTASIS) ×2 IMPLANT
STAPLER SKIN PROX WIDE 3.9 (STAPLE) ×2 IMPLANT
SUT SILK 0 TIES 10X30 (SUTURE) IMPLANT
SUT SILK 2 0 PERMA HAND 18 BK (SUTURE) ×8 IMPLANT
SUT SILK 2 0 TIES 10X30 (SUTURE) ×2 IMPLANT
SUT VIC AB 0 CT1 18XCR BRD8 (SUTURE) ×1 IMPLANT
SUT VIC AB 0 CT1 8-18 (SUTURE) ×1
SUT VIC AB 2-0 CP2 18 (SUTURE) ×2 IMPLANT
SUT VIC AB 3-0 SH 8-18 (SUTURE) ×2 IMPLANT
TOWEL GREEN STERILE (TOWEL DISPOSABLE) ×2 IMPLANT
TOWEL GREEN STERILE FF (TOWEL DISPOSABLE) ×2 IMPLANT
WATER STERILE IRR 1000ML POUR (IV SOLUTION) ×2 IMPLANT

## 2018-07-29 NOTE — Brief Op Note (Signed)
07/29/2018  11:25 AM  PATIENT:  Brian Ellison  77 y.o. male  PRE-OPERATIVE DIAGNOSIS:  Battery end of life for spinal cord stimulator  POST-OPERATIVE DIAGNOSIS:  Battery end of life for spinal cord stimulator  PROCEDURE:  Procedure(s) with comments: Spinal cord stimulator/implantable pulse generator battery change (N/A) - Spinal cord stimulator/implantable pulse generator battery change  SURGEON:  Surgeon(s) and Role:    Erline Levine, MD - Primary  PHYSICIAN ASSISTANT:   ASSISTANTS: Poteat, RN   ANESTHESIA:   general  EBL:  1 mL   BLOOD ADMINISTERED:none  DRAINS: none   LOCAL MEDICATIONS USED:  MARCAINE    and LIDOCAINE   SPECIMEN:  No Specimen  DISPOSITION OF SPECIMEN:  N/A  COUNTS:  YES  TOURNIQUET:  * No tourniquets in log *  DICTATION: DICTATION: Patient has implanted spinal cord stimulator electrodes and IPG, which is now depleted.  It was elected for patient to undergo IPG revision.  PROCEDURE: Patient was brought to the operating room and given GETA.  Left lower buttock region was prepped with betadine scrub and Duraprep.  Area of planned incision was infiltrated with lidocaine.  Prior incision was reopened and the old IPG was externalized.  Extension was connected to new IPG which was placed in the pocket and anchored with a single silk stitch.  Wound was irrigated with  vancomycin. Then irrigated once more.  Incision was closed with 2-0 Vicryl and 3-0 vicryl sutures and dressed with Dermabond and a sterile occlusive dressing.  Counts were correct at the end of the case.  PLAN OF CARE: Discharge to home after PACU  PATIENT DISPOSITION:  PACU - hemodynamically stable.   Delay start of Pharmacological VTE agent (>24hrs) due to surgical blood loss or risk of bleeding: yes

## 2018-07-29 NOTE — Anesthesia Procedure Notes (Signed)
Procedure Name: Intubation Date/Time: 07/29/2018 10:49 AM Performed by: Barrington Ellison, CRNA Pre-anesthesia Checklist: Patient identified, Emergency Drugs available, Suction available and Patient being monitored Patient Re-evaluated:Patient Re-evaluated prior to induction Oxygen Delivery Method: Circle System Utilized Preoxygenation: Pre-oxygenation with 100% oxygen Induction Type: IV induction Ventilation: Mask ventilation without difficulty, Oral airway inserted - appropriate to patient size and Two handed mask ventilation required Laryngoscope Size: Mac and 4 Grade View: Grade I Tube type: Oral Tube size: 7.5 mm Number of attempts: 1 Airway Equipment and Method: Stylet and Oral airway Placement Confirmation: ETT inserted through vocal cords under direct vision,  positive ETCO2 and breath sounds checked- equal and bilateral Secured at: 22 cm Tube secured with: Tape Dental Injury: Teeth and Oropharynx as per pre-operative assessment

## 2018-07-29 NOTE — Anesthesia Postprocedure Evaluation (Signed)
Anesthesia Post Note  Patient: Brian Ellison  Procedure(s) Performed: Spinal cord stimulator/implantable pulse generator battery change (N/A )     Patient location during evaluation: PACU Anesthesia Type: General Level of consciousness: awake and alert Pain management: pain level controlled Vital Signs Assessment: post-procedure vital signs reviewed and stable Respiratory status: spontaneous breathing, nonlabored ventilation, respiratory function stable and patient connected to nasal cannula oxygen Cardiovascular status: blood pressure returned to baseline and stable Postop Assessment: no apparent nausea or vomiting Anesthetic complications: no    Last Vitals:  Vitals:   07/29/18 1153 07/29/18 1200  BP:  (!) 141/68  Pulse:  70  Resp:    Temp: (!) 36.1 C   SpO2: 95% 96%    Last Pain:  Vitals:   07/29/18 1200  TempSrc:   PainSc: 0-No pain                 Dodi Leu COKER

## 2018-07-29 NOTE — Op Note (Signed)
07/29/2018  11:25 AM  PATIENT:  Brian Ellison  77 y.o. male  PRE-OPERATIVE DIAGNOSIS:  Battery end of life for spinal cord stimulator  POST-OPERATIVE DIAGNOSIS:  Battery end of life for spinal cord stimulator  PROCEDURE:  Procedure(s) with comments: Spinal cord stimulator/implantable pulse generator battery change (N/A) - Spinal cord stimulator/implantable pulse generator battery change  SURGEON:  Surgeon(s) and Role:    Erline Levine, MD - Primary  PHYSICIAN ASSISTANT:   ASSISTANTS: Poteat, RN   ANESTHESIA:   general  EBL:  1 mL   BLOOD ADMINISTERED:none  DRAINS: none   LOCAL MEDICATIONS USED:  MARCAINE    and LIDOCAINE   SPECIMEN:  No Specimen  DISPOSITION OF SPECIMEN:  N/A  COUNTS:  YES  TOURNIQUET:  * No tourniquets in log *  DICTATION: DICTATION: Patient has implanted spinal cord stimulator electrodes and IPG, which is now depleted.  It was elected for patient to undergo IPG revision.  PROCEDURE: Patient was brought to the operating room and given GETA.  Left lower buttock region was prepped with betadine scrub and Duraprep.  Area of planned incision was infiltrated with lidocaine.  Prior incision was reopened and the old IPG was externalized.  Extension was connected to new IPG which was placed in the pocket and anchored with a single silk stitch.  Wound was irrigated with  vancomycin. Then irrigated once more.  Incision was closed with 2-0 Vicryl and 3-0 vicryl sutures and dressed with Dermabond and a sterile occlusive dressing.  Counts were correct at the end of the case.  PLAN OF CARE: Discharge to home after PACU  PATIENT DISPOSITION:  PACU - hemodynamically stable.   Delay start of Pharmacological VTE agent (>24hrs) due to surgical blood loss or risk of bleeding: yes

## 2018-07-29 NOTE — H&P (Signed)
  Patient ID:   [id number] Patient: Brian Ellison  Date of Birth: [birth year] Visit Type: Office Visit   Date: 05/26/2018 08:30 AM Provider: Marchia Meiers. Vertell Limber MD   This 77 year old male presents for back pain.  HISTORY OF PRESENT ILLNESS:  1.  back pain  05/12/09 Permanent spinal cord stimulator placement via thoracic laminectomy with left buttock IPG   Patient presents to discuss replacement of battery for his spinal cord stimulator. [employer] representative is present and assisted the patient with his visit. Patient reports back pain and constant shoulder pain. Patient also notes balance issues, light-headedness and memory issues.  Patient reports injections with Dr. Nelva Bush are providing relief of symptoms.           MEDICATIONS: (added, continued or stopped this visit) Started Medication Directions Instruction Stopped   brimonidine 0.15 % eye drops instill 1 drop by ophthalmic route 2 times every day into affected eye(s)     Gabapentin      metformin 1,000 mg tablet take 1 tablet by oral route 2 times every day with morning and evening meals       ALLERGIES: Ingredient Reaction Medication Name Comment  NO KNOWN DRUG ALLERGIES         PHYSICAL EXAM:   Vitals Date Temp F BP Pulse Ht In Wt Lb BMI BSA Pain Score  05/26/2018  145/78 62 72 246 33.36  5/10      IMPRESSION:   Recommended replacement of spinal cord stimulator. Recommended continued injections with Dr. Nelva Bush if injections are continuing to provide relief. 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 Did not recommend surgery for stenosis. Discussed patient's burning sensation in his BLE is related to diabetic neuropathy. Discussed patient's memory issues - he did not wish to be referred to neurology  Upon examination, no weakness noted, reflexes symmetric, no pathologic reflexes, negative Hoffman's in bilateral hands  PLAN:  1. Continue injections with Dr. Nelva Bush 2. Battery change scheduled in  07/17/18 3. Follow-up 2-3 weeks after battery change   Assessment/Plan   # Detail Type Description   1. Assessment Battery end of life of spinal cord stimulator (Z45.42).                     Provider:  Marchia Meiers. Vertell Limber MD  05/26/2018 10:52 AM Dictation edited by: Mirian Mo    CC Providers: Juanita Craver [facility] [address],  [place]  [id number]-   Capitola Ladson MD  [address] [place], [place] [address]               Electronically signed by Marchia Meiers. Vertell Limber MD on 05/31/2018 03:06 PM

## 2018-07-29 NOTE — Transfer of Care (Signed)
Immediate Anesthesia Transfer of Care Note  Patient: Brian Ellison  Procedure(s) Performed: Spinal cord stimulator/implantable pulse generator battery change (N/A )  Patient Location: PACU  Anesthesia Type:General  Level of Consciousness: awake, alert  and oriented  Airway & Oxygen Therapy: Patient Spontanous Breathing  Post-op Assessment: Report given to RN and Patient moving all extremities X 4  Post vital signs: Reviewed and stable  Last Vitals:  Vitals Value Taken Time  BP    Temp    Pulse    Resp    SpO2      Last Pain:  Vitals:   07/29/18 0834  TempSrc:   PainSc: 5          Complications: No apparent anesthesia complications

## 2018-07-29 NOTE — Interval H&P Note (Signed)
History and Physical Interval Note:  07/29/2018 8:52 AM  Brian Ellison  has presented today for surgery, with the diagnosis of Battery end of life for spinal cord stimulator  The various methods of treatment have been discussed with the patient and family. After consideration of risks, benefits and other options for treatment, the patient has consented to  Procedure(s) with comments: Spinal cord stimulator/implantable pulse generator battery change (N/A) - Spinal cord stimulator/implantable pulse generator battery change as a surgical intervention .  The patient's history has been reviewed, patient examined, no change in status, stable for surgery.  I have reviewed the patient's chart and labs.  Questions were answered to the patient's satisfaction.     Peggyann Shoals

## 2018-07-30 ENCOUNTER — Encounter (HOSPITAL_COMMUNITY): Payer: Self-pay | Admitting: Neurosurgery

## 2018-09-18 DIAGNOSIS — M5136 Other intervertebral disc degeneration, lumbar region: Secondary | ICD-10-CM | POA: Diagnosis not present

## 2018-10-04 ENCOUNTER — Other Ambulatory Visit: Payer: Self-pay

## 2018-10-04 ENCOUNTER — Encounter (HOSPITAL_COMMUNITY): Payer: Self-pay

## 2018-10-04 ENCOUNTER — Emergency Department (HOSPITAL_COMMUNITY): Payer: Medicare Other

## 2018-10-04 ENCOUNTER — Observation Stay (HOSPITAL_COMMUNITY)
Admission: EM | Admit: 2018-10-04 | Discharge: 2018-10-05 | Disposition: A | Discharge status: Home / Self Care | Payer: Medicare Other | Attending: Internal Medicine | Admitting: Internal Medicine

## 2018-10-04 DIAGNOSIS — Z791 Long term (current) use of non-steroidal anti-inflammatories (NSAID): Secondary | ICD-10-CM | POA: Insufficient documentation

## 2018-10-04 DIAGNOSIS — J9601 Acute respiratory failure with hypoxia: Secondary | ICD-10-CM | POA: Diagnosis not present

## 2018-10-04 DIAGNOSIS — Z79899 Other long term (current) drug therapy: Secondary | ICD-10-CM | POA: Diagnosis not present

## 2018-10-04 DIAGNOSIS — G2581 Restless legs syndrome: Secondary | ICD-10-CM | POA: Insufficient documentation

## 2018-10-04 DIAGNOSIS — E785 Hyperlipidemia, unspecified: Secondary | ICD-10-CM | POA: Diagnosis not present

## 2018-10-04 DIAGNOSIS — Z7982 Long term (current) use of aspirin: Secondary | ICD-10-CM | POA: Diagnosis not present

## 2018-10-04 DIAGNOSIS — K219 Gastro-esophageal reflux disease without esophagitis: Secondary | ICD-10-CM | POA: Insufficient documentation

## 2018-10-04 DIAGNOSIS — I2510 Atherosclerotic heart disease of native coronary artery without angina pectoris: Secondary | ICD-10-CM | POA: Insufficient documentation

## 2018-10-04 DIAGNOSIS — I4891 Unspecified atrial fibrillation: Secondary | ICD-10-CM | POA: Insufficient documentation

## 2018-10-04 DIAGNOSIS — M1711 Unilateral primary osteoarthritis, right knee: Secondary | ICD-10-CM | POA: Diagnosis not present

## 2018-10-04 DIAGNOSIS — Z6832 Body mass index (BMI) 32.0-32.9, adult: Secondary | ICD-10-CM | POA: Insufficient documentation

## 2018-10-04 DIAGNOSIS — R079 Chest pain, unspecified: Secondary | ICD-10-CM | POA: Diagnosis not present

## 2018-10-04 DIAGNOSIS — E119 Type 2 diabetes mellitus without complications: Secondary | ICD-10-CM | POA: Diagnosis not present

## 2018-10-04 DIAGNOSIS — G4733 Obstructive sleep apnea (adult) (pediatric): Secondary | ICD-10-CM | POA: Diagnosis not present

## 2018-10-04 DIAGNOSIS — R072 Precordial pain: Secondary | ICD-10-CM

## 2018-10-04 DIAGNOSIS — J189 Pneumonia, unspecified organism: Secondary | ICD-10-CM | POA: Diagnosis not present

## 2018-10-04 DIAGNOSIS — I480 Paroxysmal atrial fibrillation: Secondary | ICD-10-CM | POA: Diagnosis present

## 2018-10-04 DIAGNOSIS — Z87891 Personal history of nicotine dependence: Secondary | ICD-10-CM | POA: Insufficient documentation

## 2018-10-04 DIAGNOSIS — R0602 Shortness of breath: Secondary | ICD-10-CM | POA: Diagnosis not present

## 2018-10-04 LAB — TSH: TSH: 0.807 u[IU]/mL (ref 0.350–4.500)

## 2018-10-04 LAB — GLUCOSE, CAPILLARY: Glucose-Capillary: 190 mg/dL — ABNORMAL HIGH (ref 70–99)

## 2018-10-04 LAB — COMPREHENSIVE METABOLIC PANEL
ALBUMIN: 3.4 g/dL — AB (ref 3.5–5.0)
ALK PHOS: 49 U/L (ref 38–126)
ALT: 31 U/L (ref 0–44)
ANION GAP: 9 (ref 5–15)
AST: 18 U/L (ref 15–41)
BILIRUBIN TOTAL: 0.6 mg/dL (ref 0.3–1.2)
BUN: 10 mg/dL (ref 8–23)
CALCIUM: 8.8 mg/dL — AB (ref 8.9–10.3)
CO2: 21 mmol/L — ABNORMAL LOW (ref 22–32)
CREATININE: 0.87 mg/dL (ref 0.61–1.24)
Chloride: 110 mmol/L (ref 98–111)
GFR calc Af Amer: >60 mL/min (ref >60)
GFR calc non Af Amer: >60 mL/min (ref >60)
GLUCOSE: 207 mg/dL — AB (ref 70–99)
Potassium: 4 mmol/L (ref 3.5–5.1)
Sodium: 140 mmol/L (ref 135–145)
TOTAL PROTEIN: 6.1 g/dL — AB (ref 6.5–8.1)

## 2018-10-04 LAB — CBC
HEMATOCRIT: 43.5 % (ref 39.0–52.0)
HEMOGLOBIN: 13.7 g/dL (ref 13.0–17.0)
MCH: 27.8 pg (ref 26.0–34.0)
MCHC: 31.5 g/dL (ref 30.0–36.0)
MCV: 88.4 fL (ref 80.0–100.0)
Platelets: 180 10*3/uL (ref 150–400)
RBC: 4.92 MIL/uL (ref 4.22–5.81)
RDW: 13.7 % (ref 11.5–15.5)
WBC: 9.5 10*3/uL (ref 4.0–10.5)
nRBC: 0 % (ref 0.0–0.2)

## 2018-10-04 LAB — LIPID PANEL
Cholesterol: 157 mg/dL (ref 0–200)
HDL: 51 mg/dL (ref >40)
LDL Cholesterol: 96 mg/dL (ref 0–99)
Total CHOL/HDL Ratio: 3.1 RATIO
Triglycerides: 52 mg/dL (ref <150)
VLDL: 10 mg/dL (ref 0–40)

## 2018-10-04 LAB — URINALYSIS, ROUTINE W REFLEX MICROSCOPIC
Bacteria, UA: NONE SEEN
Bilirubin Urine: NEGATIVE
Glucose, UA: >=500 mg/dL — AB
HGB URINE DIPSTICK: NEGATIVE
Ketones, ur: NEGATIVE mg/dL
Leukocytes, UA: NEGATIVE
Nitrite: NEGATIVE
Protein, ur: NEGATIVE mg/dL
Specific Gravity, Urine: 1.028 (ref 1.005–1.030)
pH: 6 (ref 5.0–8.0)

## 2018-10-04 LAB — CK: Total CK: 40 U/L — ABNORMAL LOW (ref 49–397)

## 2018-10-04 LAB — HEMOGLOBIN A1C
Hgb A1c MFr Bld: 8.1 % — ABNORMAL HIGH (ref 4.8–5.6)
Mean Plasma Glucose: 185.77 mg/dL

## 2018-10-04 LAB — TROPONIN I

## 2018-10-04 LAB — I-STAT TROPONIN, ED: Troponin i, poc: 0 ng/mL (ref 0.00–0.08)

## 2018-10-04 LAB — LACTIC ACID, PLASMA: Lactic Acid, Venous: 1.8 mmol/L (ref 0.5–1.9)

## 2018-10-04 LAB — MRSA PCR SCREENING: MRSA by PCR: NEGATIVE

## 2018-10-04 LAB — D-DIMER, QUANTITATIVE: D-Dimer, Quant: 0.36 ug/mL-FEU (ref 0.00–0.50)

## 2018-10-04 LAB — CBG MONITORING, ED: Glucose-Capillary: 204 mg/dL — ABNORMAL HIGH (ref 70–99)

## 2018-10-04 LAB — BRAIN NATRIURETIC PEPTIDE: B Natriuretic Peptide: 84.2 pg/mL (ref 0.0–100.0)

## 2018-10-04 MED ORDER — LORAZEPAM 2 MG/ML IJ SOLN
1.0000 mg | Freq: Once | INTRAMUSCULAR | Status: DC
  Administered 2018-10-04: 1 mg via INTRAVENOUS
  Filled 2018-10-04: qty 1

## 2018-10-04 MED ORDER — KETOROLAC TROMETHAMINE 30 MG/ML IJ SOLN
30.0000 mg | Freq: Once | INTRAMUSCULAR | Status: AC
  Administered 2018-10-04: 30 mg via INTRAVENOUS
  Filled 2018-10-04: qty 1

## 2018-10-04 MED ORDER — SODIUM CHLORIDE 0.9 % IV SOLN
500.0000 mg | Freq: Once | INTRAVENOUS | Status: DC
  Filled 2018-10-04: qty 500

## 2018-10-04 MED ORDER — ONDANSETRON HCL 4 MG/2ML IJ SOLN: 4.0000 mg | Freq: Four times a day (QID) | INTRAMUSCULAR | Status: DC | PRN

## 2018-10-04 MED ORDER — METOPROLOL TARTRATE 12.5 MG HALF TABLET
12.5000 mg | ORAL_TABLET | Freq: Two times a day (BID) | ORAL | Status: DC
  Administered 2018-10-04 – 2018-10-05 (×2): 12.5 mg via ORAL
  Filled 2018-10-04 (×2): qty 1

## 2018-10-04 MED ORDER — IOPAMIDOL (ISOVUE-370) INJECTION 76%
INTRAVENOUS | Status: AC
  Administered 2018-10-04: 80 mL
  Filled 2018-10-04: qty 100

## 2018-10-04 MED ORDER — ACETAMINOPHEN 325 MG PO TABS: 650.0000 mg | ORAL_TABLET | Freq: Four times a day (QID) | ORAL | Status: DC | PRN

## 2018-10-04 MED ORDER — CYANOCOBALAMIN 500 MCG PO TABS
750.0000 ug | ORAL_TABLET | Freq: Every day | ORAL | Status: DC
  Administered 2018-10-05: 750 ug via ORAL
  Filled 2018-10-04 (×2): qty 2

## 2018-10-04 MED ORDER — ASPIRIN EC 81 MG PO TBEC
81.0000 mg | DELAYED_RELEASE_TABLET | Freq: Every day | ORAL | Status: DC
  Administered 2018-10-05: 81 mg via ORAL
  Filled 2018-10-04: qty 1

## 2018-10-04 MED ORDER — FENTANYL CITRATE (PF) 100 MCG/2ML IJ SOLN
50.0000 ug | Freq: Once | INTRAMUSCULAR | Status: AC
  Administered 2018-10-04: 50 ug via INTRAVENOUS
  Filled 2018-10-04: qty 2

## 2018-10-04 MED ORDER — NITROGLYCERIN 0.4 MG SL SUBL
0.4000 mg | SUBLINGUAL_TABLET | Freq: Once | SUBLINGUAL | Status: DC
  Administered 2018-10-04: 0.4 mg via SUBLINGUAL

## 2018-10-04 MED ORDER — SODIUM CHLORIDE 0.9 % IV SOLN
1.0000 g | INTRAVENOUS | Status: DC
  Administered 2018-10-04: 1 g via INTRAVENOUS
  Filled 2018-10-04: qty 10

## 2018-10-04 MED ORDER — INSULIN ASPART 100 UNIT/ML ~~LOC~~ SOLN: 0.0000 [IU] | Freq: Every day | SUBCUTANEOUS | Status: DC

## 2018-10-04 MED ORDER — LORATADINE 10 MG PO TABS
10.0000 mg | ORAL_TABLET | Freq: Every day | ORAL | Status: DC
  Administered 2018-10-05: 10 mg via ORAL
  Filled 2018-10-04: qty 1

## 2018-10-04 MED ORDER — METOPROLOL TARTRATE 5 MG/5ML IV SOLN
INTRAVENOUS | Status: AC
  Filled 2018-10-04: qty 10

## 2018-10-04 MED ORDER — AZITHROMYCIN 500 MG PO TABS
500.0000 mg | ORAL_TABLET | Freq: Every day | ORAL | Status: DC
  Administered 2018-10-04 – 2018-10-05 (×2): 500 mg via ORAL
  Filled 2018-10-04 (×2): qty 1

## 2018-10-04 MED ORDER — PANTOPRAZOLE SODIUM 40 MG PO TBEC
40.0000 mg | DELAYED_RELEASE_TABLET | Freq: Every day | ORAL | Status: DC
  Administered 2018-10-05: 40 mg via ORAL
  Filled 2018-10-04: qty 1

## 2018-10-04 MED ORDER — ASPIRIN 81 MG PO CHEW
324.0000 mg | CHEWABLE_TABLET | Freq: Once | ORAL | Status: AC
  Administered 2018-10-04: 324 mg via ORAL
  Filled 2018-10-04: qty 4

## 2018-10-04 MED ORDER — LORAZEPAM 2 MG/ML IJ SOLN: 0.5000 mg | Freq: Once | INTRAMUSCULAR | Status: DC | PRN

## 2018-10-04 MED ORDER — BRIMONIDINE TARTRATE 0.15 % OP SOLN
1.0000 [drp] | Freq: Two times a day (BID) | OPHTHALMIC | Status: DC
  Administered 2018-10-04 – 2018-10-05 (×2): 1 [drp] via OPHTHALMIC
  Filled 2018-10-04: qty 5

## 2018-10-04 MED ORDER — FLUTICASONE PROPIONATE 50 MCG/ACT NA SUSP
1.0000 | Freq: Every day | NASAL | Status: DC
  Administered 2018-10-04: 1 via NASAL
  Filled 2018-10-04: qty 16

## 2018-10-04 MED ORDER — ROPINIROLE HCL 1 MG PO TABS
1.0000 mg | ORAL_TABLET | Freq: Every day | ORAL | Status: DC
  Filled 2018-10-04: qty 1

## 2018-10-04 MED ORDER — SODIUM CHLORIDE 0.9 % IV BOLUS: 1000.0000 mL | Freq: Once | INTRAVENOUS | Status: DC

## 2018-10-04 MED ORDER — KETOROLAC TROMETHAMINE 15 MG/ML IJ SOLN: 15.0000 mg | Freq: Once | INTRAMUSCULAR | Status: DC

## 2018-10-04 MED ORDER — NITROGLYCERIN 0.4 MG SL SUBL
0.4000 mg | SUBLINGUAL_TABLET | SUBLINGUAL | Status: DC | PRN
  Administered 2018-10-04 (×3): 0.4 mg via SUBLINGUAL
  Filled 2018-10-04: qty 1

## 2018-10-04 MED ORDER — INSULIN ASPART 100 UNIT/ML ~~LOC~~ SOLN
0.0000 [IU] | Freq: Three times a day (TID) | SUBCUTANEOUS | Status: DC
  Administered 2018-10-04: 4 [IU] via SUBCUTANEOUS
  Administered 2018-10-05 (×2): 2 [IU] via SUBCUTANEOUS

## 2018-10-04 MED ORDER — SODIUM CHLORIDE 0.9 % IV SOLN
1.0000 g | Freq: Once | INTRAVENOUS | Status: DC
  Filled 2018-10-04: qty 10

## 2018-10-04 MED ORDER — RIVAROXABAN 20 MG PO TABS
20.0000 mg | ORAL_TABLET | Freq: Every day | ORAL | Status: DC
  Administered 2018-10-04: 20 mg via ORAL
  Filled 2018-10-04 (×2): qty 1

## 2018-10-04 MED ORDER — METOPROLOL TARTRATE 5 MG/5ML IV SOLN
5.0000 mg | INTRAVENOUS | Status: DC | PRN
  Administered 2018-10-04: 5 mg via INTRAVENOUS

## 2018-10-04 MED ORDER — ONDANSETRON HCL 4 MG PO TABS: 4.0000 mg | ORAL_TABLET | Freq: Four times a day (QID) | ORAL | Status: DC | PRN

## 2018-10-04 MED ORDER — ACETAMINOPHEN 650 MG RE SUPP
650.0000 mg | Freq: Four times a day (QID) | RECTAL | Status: DC | PRN
  Administered 2018-10-04: 650 mg via RECTAL
  Filled 2018-10-04: qty 1

## 2018-10-04 MED ORDER — GABAPENTIN 300 MG PO CAPS: 600.0000 mg | ORAL_CAPSULE | Freq: Two times a day (BID) | ORAL | Status: DC

## 2018-10-04 NOTE — ED Notes (Signed)
Pts lunch tray arrived, family given sandwich.

## 2018-10-04 NOTE — ED Provider Notes (Signed)
[facility]ERG[facility] DE[facility]MENT Provider Note   CSN: [id number] Arrival date & time: 10/04/18  6578     History   Chief Complaint Chief Complaint  Patient presents with  . Chest Pain    HPI Brian Ellison is a 78 y.o. male.  The history is provided by the patient.  Chest Pain  Pain location:  Substernal area Pain quality: aching and pressure   Pain radiates to:  Neck Pain severity:  Mild Onset quality:  Gradual Timing:  Intermittent Progression:  Worsening Chronicity:  New Context: breathing, movement and at rest   Relieved by:  Nothing Worsened by:  Deep breathing and certain positions Associated symptoms: shortness of breath   Associated symptoms: no abdominal pain, no altered mental status, no back pain, no claudication, no cough, no dizziness, no fever, no headache, no heartburn, no lower extremity edema, no nausea, no orthopnea, no palpitations, no syncope and no vomiting   Risk factors: diabetes mellitus and high cholesterol   Risk factors: no smoking     Past Medical History:  Diagnosis Date  . Allergic rhinitis   . Arthritis    SPINAL AND JOINTS  . Bladder polyp   . BPH (benign prostatic hypertrophy)   . Chronic back pain   . Diverticulosis   . Dyslipidemia    per pt questionable  . ED (erectile dysfunction) of organic origin   . GERD (gastroesophageal reflux disease)   . Glaucoma    BOTH EYES  . History of colon polyps   . History of diverticulitis of colon   . History of gastritis   . History of kidney stones    many yrs ago  . History of kidney stones   . 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 History of shingles   . Lower urinary tract symptoms (LUTS)   . OSA on CPAP    very severe per study 01-31-2006  . Peyronie's disease   . S/P insertion of spinal cord stimulator    2010  . Tingling    right arm;takes Gabapentin  . Type 2 diabetes mellitus [facility])     Patient  Active Problem List   Diagnosis Date Noted  . Atrial fibrillation ([place]) 10/04/2018  . Morbid (severe) obesity due to excess calories ([place]) 12/24/2017  . S/P shoulder replacement 09/23/2015  . Esophageal reflux 06/01/2013  . Neuralgia 04/28/2013  . Restless leg syndrome 04/28/2013  . Osteoarthritis of right knee S/P right total knee arthroplasty 04/24/2013  . Right knee pain 04/24/2013  . Obstructive sleep apnea 06/27/2009  . DYSLIPIDEMIA 06/09/2008  . Seasonal and perennial allergic rhinitis 06/09/2008    Past Surgical History:  Procedure Laterality Date  . ANTERIOR CERVICAL DECOMP/DISCECTOMY FUSION  06-16-2004   C3 -- C5  . APPENDECTOMY  age 32  . CATARACT EXTRACTION W/ INTRAOCULAR LENS  IMPLANT, BILATERAL    . COLONOSCOPY    . CYSTOSCOPY WITH BIOPSY N/A 07/15/2015   Procedure: CYSTOSCOPY WITH BLADDER AND PROSTATE BIOPSY;  Surgeon: Festus Aloe, MD;  Location: [facility];  Service: Urology;  Laterality: N/A;  . HAND SURGERY Right 2017   "right hand middle finger knuckle replaced"  . INGUINAL HERNIA REPAIR Bilateral 1970's  . NASAL SEPTUM SURGERY  yrs ago  . PENILE PROSTHESIS IMPLANT  11-26-2007  . PERMANANT SPINAL CORD STIMULATOR IMPLANT VIA THORACIC LAMINECTOMY  05-12-2009   Medtronic generator (left buttock)  . POSTERIOR LAMINECTOMY /  DECOMPRESSION CERVICAL SPINE  03-08-2005   C3  -- C6  . RETINAL DETACHMENT SURGERY Right   . REVERSE SHOULDER ARTHROPLASTY Left 09/23/2015   Procedure: LEFT REVERSE TOTAL SHOULDER ARTHROPLASTY;  Surgeon: Netta Cedars, MD;  Location: [place];  Service: Orthopedics;  Laterality: Left;  . REVISION TOTAL ARTHROPLASTY RIGHT SHOULDER  07-22-2009  . RIGHT HEEL SURGERY     spurs  . RIGHT SHOULDER HEMIARTHROPLASTY  08-20-2008  . ROTATOR CUFF REPAIR Bilateral x1 right/  x2  left - last one 2013  . SHOULDER OPEN ROTATOR CUFF REPAIR Bilateral   . SPINAL CORD STIMULATOR BATTERY EXCHANGE N/A 07/29/2018   Procedure: Spinal cord  stimulator/implantable pulse generator battery change;  Surgeon: Erline Levine, MD;  Location: [place];  Service: Neurosurgery;  Laterality: N/A;  Spinal cord stimulator/implantable pulse generator battery change  . TONSILLECTOMY    . TOTAL KNEE ARTHROPLASTY Right 04/17/2013   Procedure: RIGHT TOTAL KNEE ARTHROPLASTY;  Surgeon: Augustin Schooling, MD;  Location: [place];  Service: Orthopedics;  Laterality: Right;  . TOTAL KNEE ARTHROPLASTY Left 02-10-2009        Home Medications    Prior to Admission medications   Medication Sig Start Date End Date Taking? Authorizing Provider  ARTIFICIAL TEAR SOLUTION OP Place 1 drop into both eyes daily as needed (dry eyes).   Yes [provider]  aspirin 81 MG tablet Take 1 tablet (81 mg total) by mouth daily. 07/17/15  Yes Festus Aloe, MD  brimonidine (ALPHAGAN) 0.15 % ophthalmic solution Place 1 drop into both eyes 2 (two) times daily.   Yes [provider]  cetirizine (ZYRTEC) 10 MG tablet Take 10 mg by mouth daily.   Yes [provider]  fluticasone (FLONASE) 50 MCG/ACT nasal spray Place 1 spray into both nostrils at bedtime.   Yes [provider]  gabapentin (NEURONTIN) 600 MG tablet Take 600 mg by mouth 2 (two) times daily.    Yes [provider]  glipiZIDE (GLUCOTROL) 5 MG tablet Take 2.5 mg by mouth daily before breakfast.   Yes [provider]  ibuprofen (ADVIL,MOTRIN) 200 MG tablet Take 600 mg by mouth at bedtime as needed for headache or moderate pain.   Yes [provider]  metFORMIN (GLUCOPHAGE) 500 MG tablet Take 500-1,000 mg by mouth See admin instructions. 1000 mg in the morning and 500 mg in the evening   Yes [provider]  pantoprazole (PROTONIX) 40 MG tablet Take 40 mg by mouth daily.   Yes [provider]  rOPINIRole (REQUIP) 1 MG tablet Take 1 mg by mouth at bedtime.     Yes [provider]  vitamin B-12 (CYANOCOBALAMIN) 500 MCG tablet Take 850 mcg  by mouth daily.    Yes [provider]    Family History History reviewed. No pertinent family history.  Social History Social History   Tobacco Use  . Smoking status: Former Smoker    Packs/day: 1.00    Years: 15.00    Pack years: 15.00    Types: Cigarettes    Last attempt to quit: 07/08/1971    Years since quitting: 47.2  . Smokeless tobacco: Never Used  Substance Use Topics  . Alcohol use: Yes    Comment: rare  . Drug use: No     Allergies   Patient has no known allergies.   Review of Systems Review of Systems  Constitutional: Negative for chills and fever.  HENT: Negative for ear pain and sore throat.   Eyes:  Negative for pain and visual disturbance.  Respiratory: Positive for shortness of breath. Negative for cough.   Cardiovascular: Positive for chest pain. Negative for palpitations, orthopnea, claudication and syncope.  Gastrointestinal: Negative for abdominal pain, heartburn, nausea and vomiting.  Genitourinary: Negative for dysuria and hematuria.  Musculoskeletal: Negative for arthralgias and back pain.  Skin: Negative for color change and rash.  Neurological: Negative for dizziness, seizures, syncope and headaches.  All other systems reviewed and are negative.    Physical Exam Updated Vital Signs  ED Triage Vitals  Enc Vitals Group     BP 10/04/18 1015 125/79     Pulse Rate 10/04/18 1015 83     Resp 10/04/18 1015 20     Temp --      Temp src --      SpO2 10/04/18 1015 92 %     Weight 10/04/18 1011 240 lb (108.9 kg)     Height 10/04/18 1011 6' (1.829 m)     Head Circumference --      Peak Flow --      Pain Score 10/04/18 1011 10     Pain Loc --      Pain Edu? --      Excl. in [place]? --     Physical Exam Vitals signs and nursing note reviewed.  Constitutional:      General: He is in acute distress.     Appearance: He is well-developed. He is not ill-appearing.  HENT:     Head: Normocephalic and atraumatic.  Eyes:      Conjunctiva/sclera: Conjunctivae normal.     Pupils: Pupils are equal, round, and reactive to light.  Neck:     Musculoskeletal: Normal range of motion and neck supple.  Cardiovascular:     Rate and Rhythm: Normal rate and regular rhythm.     Pulses:          Radial pulses are 2+ on the right side and 2+ on the left side.       Dorsalis pedis pulses are 2+ on the right side and 2+ on the left side.     Heart sounds: Normal heart sounds. No murmur.  Pulmonary:     Effort: Pulmonary effort is normal. No respiratory distress.     Breath sounds: Normal breath sounds. No decreased breath sounds, wheezing, rhonchi or rales.  Abdominal:     General: Bowel sounds are normal.     Palpations: Abdomen is soft.     Tenderness: There is no abdominal tenderness.  Musculoskeletal: Normal range of motion.     Right lower leg: No edema.     Left lower leg: No edema.  Skin:    General: Skin is warm and dry.     Capillary Refill: Capillary refill takes less than 2 seconds.  Neurological:     General: No focal deficit present.     Mental Status: He is alert.     Cranial Nerves: No cranial nerve deficit.     Motor: No weakness.     Comments: 5+ out of 5 strength, normal sensation, no drift, normal speech      ED Treatments / Results  Labs (all labs ordered are listed, but only abnormal results are displayed) Labs Reviewed  COMPREHENSIVE METABOLIC PANEL - Abnormal; Notable for the following components:      Result Value   CO2 21 (*)    Glucose, Bld 207 (*)    Calcium 8.8 (*)    Total Protein 6.1 (*)  Albumin 3.4 (*)    All other components within normal limits  CBG MONITORING, ED - Abnormal; Notable for the following components:   Glucose-Capillary 204 (*)    All other components within normal limits  CULTURE, BLOOD (ROUTINE X 2)  CULTURE, BLOOD (ROUTINE X 2)  CBC  D-DIMER, QUANTITATIVE (NOT AT [facility])  BRAIN NATRIURETIC PEPTIDE  TSH  TROPONIN I  TROPONIN I  TROPONIN I  I-STAT  TROPONIN, ED    EKG EKG Interpretation  Date/Time:  Saturday October 04 2018 11:09:44 EST Ventricular Rate:  94 PR Interval:    QRS Duration: 94 QT Interval:  368 QTC Calculation: 461 R Axis:   99 Text Interpretation:  Atrial fibrillation Right axis deviation Borderline T wave abnormalities Confirmed by Lennice Sites [phone]) on 10/04/2018 11:17:28 AM Also confirmed by Lennice Sites [phone]), editor Radene Gunning [phone])  on 10/04/2018 11:35:20 AM   Radiology Ct Angio Chest Pe W And/or Wo Contrast  Result Date: 10/04/2018 CLINICAL DATA:  Shortness of breath and chest pain. EXAM: CT ANGIOGRAPHY CHEST WITH CONTRAST TECHNIQUE: Multidetector CT imaging of the chest was performed using the standard protocol during bolus administration of intravenous contrast. Multiplanar CT image reconstructions and MIPs were obtained to evaluate the vascular anatomy. CONTRAST:  78mL ISOVUE-370 IOPAMIDOL (ISOVUE-370) INJECTION 76% COMPARISON:  Chest x-ray earlier today. FINDINGS: Cardiovascular: The pulmonary arteries are adequately opacified. There is no evidence of pulmonary embolism. The thoracic aorta is normal in caliber. The heart is mildly enlarged and the left atrium in particular appears dilated. No evidence of pericardial fluid. There is a mild amount of calcified coronary artery plaque in a 3 vessel distribution. Mediastinum/Nodes: No enlarged mediastinal, hilar, or axillary lymph nodes. Thyroid gland, trachea, and esophagus demonstrate no significant findings. Lungs/Pleura: Bibasilar atelectasis with more prominent density at the left base compared to the right. Early pneumonia at the left lung base cannot be excluded. No edema, nodule, pneumothorax or pleural fluid identified. Upper Abdomen: No acute abnormality. Musculoskeletal: No chest wall abnormality. No acute or significant osseous findings. Review of the MIP images confirms the above findings. IMPRESSION: 1. No evidence of pulmonary embolism. 2.  Coronary atherosclerosis with mild amount of calcified plaque in a 3 vessel distribution. 3. Bibasilar atelectasis, left greater than right. Early pneumonia at the left lung base can not be excluded. Electronically Signed   By: Aletta Edouard M.D.   On: 10/04/2018 11:55   Dg Chest Portable 1 View  Result Date: 10/04/2018 CLINICAL DATA:  78 year old male with a history of chest pain EXAM: PORTABLE CHEST 1 VIEW COMPARISON:  07/15/2009 FINDINGS: Cardiomediastinal silhouette unchanged. Low lung volumes with fullness in the central vasculature. Reticulonodular opacity of left greater than right lung. No pneumothorax. Surgical changes of bilateral shoulders. Surgical changes of the cervical region. IMPRESSION: Low lung volumes with reticulonodular opacities, potentially representing atypical infection, chronic changes, or early edema. Correlation with lab values may be useful. Electronically Signed   By: Corrie Mckusick D.O.   On: 10/04/2018 10:21    Procedures .Critical Care Performed by: Lennice Sites, DO Authorized by: Lennice Sites, DO   Critical care provider statement:    Critical care time (minutes):  35   Critical care time was exclusive of:  Separately billable procedures and treating other patients and teaching time   Critical care was necessary to treat or prevent imminent or life-threatening deterioration of the following conditions:  Respiratory failure   Critical care was time spent personally by me on the following activities:  Development of treatment plan with patient or surrogate, discussions with primary provider, evaluation of patient's response to treatment, examination of patient, ordering and performing treatments and interventions, ordering and review of laboratory studies, ordering and review of radiographic studies, pulse oximetry, re-evaluation of patient's condition and review of old charts   I assumed direction of critical care for this patient from another provider in my  specialty: no     (including critical care time)  Medications Ordered in ED Medications  fentaNYL (SUBLIMAZE) injection 50 mcg (has no administration in time range)  aspirin EC tablet 81 mg (has no administration in time range)  pantoprazole (PROTONIX) EC tablet 40 mg (has no administration in time range)  vitamin B-12 (CYANOCOBALAMIN) tablet 750 mcg (has no administration in time range)  gabapentin (NEURONTIN) capsule 600 mg (has no administration in time range)  rOPINIRole (REQUIP) tablet 1 mg (has no administration in time range)  loratadine (CLARITIN) tablet 10 mg (has no administration in time range)  fluticasone (FLONASE) 50 MCG/ACT nasal spray 1 spray (has no administration in time range)  brimonidine (ALPHAGAN) 0.15 % ophthalmic solution 1 drop (has no administration in time range)  acetaminophen (TYLENOL) tablet 650 mg (has no administration in time range)    Or  acetaminophen (TYLENOL) suppository 650 mg (has no administration in time range)  ondansetron (ZOFRAN) tablet 4 mg (has no administration in time range)    Or  ondansetron (ZOFRAN) injection 4 mg (has no administration in time range)  nitroGLYCERIN (NITROSTAT) SL tablet 0.4 mg (has no administration in time range)  insulin aspart (novoLOG) injection 0-9 Units (has no administration in time range)  insulin aspart (novoLOG) injection 0-5 Units (has no administration in time range)  aspirin chewable tablet 324 mg (324 mg Oral Given 10/04/18 1027)  iopamidol (ISOVUE-370) 76 % injection (80 mLs  Contrast Given 10/04/18 1115)     Initial Impression / Assessment and Plan / ED Course  I have reviewed the triage vital signs and the nursing notes.  Pertinent labs & imaging results that were available during my care of the patient were reviewed by me and considered in my medical decision making (see chart for details).     Brian Ellison is a 78 year old male with history of high cholesterol, diabetes who presents to the ED  with chest pain, shortness of breath.  Patient with overall unremarkable vitals.  Does have some tachypnea.  Patient with oxygen in the high 80s and improved on 2 L of oxygen.  He states that he has had chest pressure that is worse with inspiration since yesterday.  Has taken some over-the-counter medications without much relief.  EKG here shows atrial fibrillation.  Has no history of atrial fibrillation.  Denies any cough, sputum production, fever.  Patient has overall clear breath sounds on exam.  No abdominal tenderness.  Normal neurological exam.  Concern for possible ACS, pneumonia, PE.  Patient with normal troponin.  No significant leukocytosis, anemia, electrolyte abnormality.  CT scan of the chest showed possible left lower lobe pneumonia.  No pulmonary embolism.  Patient with new A. fib, questionable pneumonia, possible ACS.  At this time given hypoxic respiratory failure, CT findings most likely pneumonia.  Will start IV Rocephin, IV Zithromax.  Patient to be admitted to medicine service for further care.  Patient already given aspirin and nitroglycerin.  Lactic acid has been added, blood cultures, pro calcitonin.  Influenza panel also added.  Patient admitted to medicine with those labs pending.  No  concern for sepsis at this time.  This chart was dictated using voice recognition software.  Despite best efforts to proofread,  errors can occur which can change the documentation meaning.   Final Clinical Impressions(s) / ED Diagnoses   Final diagnoses:  Acute respiratory failure with hypoxia ([place])  Chest pain, unspecified type  Community acquired pneumonia, unspecified laterality  Atrial fibrillation, unspecified type [facility])    ED Discharge Orders    None       Lennice Sites, DO 10/04/18 1347

## 2018-10-04 NOTE — Progress Notes (Signed)
Patient arrived to the unit from [facility]. Patient is alert and oriented X4. He is currently on 2L O2 [place].  Patient VS....Marland KitchenB/P 131/78, HR 80, RR 23, O295%on 2L. Patient currently in AFIB. Patient complains of pain in throat and neck area bilaterally. Patient states that it hurts to take a deep breath so he can only take shallow breaths at this time.

## 2018-10-04 NOTE — Progress Notes (Signed)
Patient has started having chills.  Rechecked patient's VS Temp 99.8, RR35, HR120, B/P151/83, Patient states he is not nauseated, and he is not having any chest pain.  Patient is shaking uncontrollably. Patient states he is not SOB but very painful to breath deep.

## 2018-10-04 NOTE — ED Notes (Signed)
MD Cur[place]olo aware pt c/o 10/10 chest pain with increasing SOB.  Pt requesting pain meds stating I cant even take deep breaths b/c pain.  Waiting on orders.  Admitting at bedside.

## 2018-10-04 NOTE — Progress Notes (Signed)
Patient transferred to [facility].  Prior to transfer- pt Heart rate jumped up to 168 bpm while ambulating to bathroom.  Heart rate immediately dropped back down once at rest.   Chest pain still 10 out of 10 (per pt does feel like it worsen with activity), SOB, no dizziness.

## 2018-10-04 NOTE — H&P (Signed)
Date: 10/04/2018               Patient Name:  Brian Ellison MRN: [id number]  DOB: [birth year] Age / Sex: 78 y.o., male   PCP: [facility], [place] Service: Internal Medicine Teaching Service         Attending Physician: Dr. Sid Falcon, MD    First Contact: Dr. Koleen Distance Pager: [phone]  Second Contact: Dr. Frederico Hamman Pager: [phone]       After Hours (After 5p/  First Contact Pager: [phone]  weekends / holidays): Second Contact Pager: [phone]   Chief Complaint: chest pain   History of Present Illness: Brian Ellison is a 78 y/o gentleman with PMHx DM, OSA presents with acute onset substernal chest pain that radiates into both shoulders that began while he was sitting in his chair last night. The pain is worse with breathing which makes him feel short of breath. The pain radiates into both shoulders and is described as sharp and stabbing. No associated diaphoresis, light headedness, palpitations. He took some Advil last night and was able to sleep, but when he woke up this morning the pain had gotten worse. He has never had this type of pain before.  He denies fevers, chills, nasal congestion, sore throat, cough, recent sick contacts, abdominal pain, n/v, changes in bowel or bladder habits. No recent changes in his medications. He drinks up to 4 cups of coffee per day. No history of heart disease or COPD.   Meds:  Current Meds  Medication Sig  . ARTIFICIAL TEAR SOLUTION OP Place 1 drop into both eyes daily as needed (dry eyes).  Marland Kitchen aspirin 81 MG tablet Take 1 tablet (81 mg total) by mouth daily.  . brimonidine (ALPHAGAN) 0.15 % ophthalmic solution Place 1 drop into both eyes 2 (two) times daily.  . cetirizine (ZYRTEC) 10 MG tablet Take 10 mg by mouth daily.  . fluticasone (FLONASE) 50 MCG/ACT nasal spray Place 1 spray into both nostrils at bedtime.  . gabapentin (NEURONTIN) 600 MG tablet Take 600 mg by mouth 2 (two) times daily.   Marland Kitchen glipiZIDE (GLUCOTROL) 5 MG  tablet Take 2.5 mg by mouth daily before breakfast.  . ibuprofen (ADVIL,MOTRIN) 200 MG tablet Take 600 mg by mouth at bedtime as needed for headache or moderate pain.  . metFORMIN (GLUCOPHAGE) 500 MG tablet Take 500-1,000 mg by mouth See admin instructions. 1000 mg in the morning and 500 mg in the evening  . pantoprazole (PROTONIX) 40 MG tablet Take 40 mg by mouth daily.  Marland Kitchen rOPINIRole (REQUIP) 1 MG tablet Take 1 mg by mouth at bedtime.    . vitamin B-12 (CYANOCOBALAMIN) 500 MCG tablet Take 850 mcg by mouth daily.      Allergies: Allergies as of 10/04/2018  . (No Known Allergies)   Past Medical History:  Diagnosis Date  . Allergic rhinitis   . Arthritis    SPINAL AND JOINTS  . Bladder polyp   . BPH (benign prostatic hypertrophy)   . Chronic back pain   . Diverticulosis   . Dyslipidemia    per pt questionable  . ED (erectile dysfunction) of organic origin   . GERD (gastroesophageal reflux disease)   . Glaucoma    BOTH EYES  . History of colon polyps   . History of diverticulitis of colon   . History of gastritis   . History of kidney stones    many yrs  ago  . History of kidney stones   . 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 History of shingles   . Lower urinary tract symptoms (LUTS)   . OSA on CPAP    very severe per study 01-31-2006  . Peyronie's disease   . S/P insertion of spinal cord stimulator    2010  . Tingling    right arm;takes Gabapentin  . Type 2 diabetes mellitus (HCC)     Family History: DM, heart disease - father  Social History: Lives at home with his wife, remote history of tobacco and EtOH use (quit smoking in 2426), no illicit drug use  Review of Systems: A complete ROS was negative except as per HPI.   Physical Exam: Blood pressure 109/82, pulse 95, resp. rate (!) 29, height 6' (1.829 m), weight 108.9 kg, SpO2 94 %. General: awake, alert, appears uncomfortable HEENT: [place]/AT; bilateral  conjunctival injection; moist mucous membranes Neck: supple; no JVD CV: irregularly irregular; regular rate Pulm: no increased work of breathing; speaking in full sentences; crackles at bases bilaterally but otherwise clear; no chest wall tenderness  Abd: BS+; abdomen is soft, non-tender, non-distended  Neuro: A&Ox3; answers questions and follows commands appropriately; no focal deficits Ext: no lower extremity edema   EKG: personally reviewed my interpretation is afib; no ischemic changes  CXR: personally reviewed my interpretation is bilateral shoulder replacements; surgical changes of cervical spine; low lung volumes; no obvious infiltrate or effusion   Assessment & Plan by Problem: Active Problems:   Atrial fibrillation ([place])  1. New onset afib: Unclear chronicity but EKG from November shows sinus.  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 Chest pain - seems pleuritic in nature but also concerning for ACS; will treat with nitro PRN  - trending troponins as above - CT ruled out PE, pericarditis as possible etiologies  - may be in the setting of new onset afib  - received fentanyl for pain in the ED; will continue to monitor for improvement as he gets better rate control   3. Type II DM -  home regimen includes Metformin and Glipizide - will start him on SSI while inpatient   4. OSA - CPAP qhs   DIET: HHCM DVT ppx: Xarelto CODE: FULL   Dispo: Admit patient to Inpatient with expected length of stay greater than 2 midnights.  SignedDelice Bison, DO 10/04/2018, 2:09 PM  Pager: [phone]

## 2018-10-04 NOTE — Progress Notes (Signed)
Attempted to call report at 4:30PM AND 5pm.  Nurse unavailable.   Will call me back in 5 minutes

## 2018-10-04 NOTE — Progress Notes (Signed)
Reassessed patient at 2000 after being paged about agitation.  When I entered the room, Brian Ellison was attempting to remove the mittens that the nursing staff have placed on earlier today.  He asked me numerous times to remove them.  He appeared confused and had removed his telemetry. He was able to tell me his name but did not remember where he was or what brought him in.  This is an acute change since admission earlier today.  He did not have any other complaints.  Denied chest pain.  His heart rate was between 100-110 and irregular. He was normotensive and his respiratory rate was appropriate.  He was oxygenating well on room air.  Neuro: Alert and oriented to self only.  Able to follow commands.  He is moving all his extremities spontaneously and purposefully.  No focal deficits noted. Redirectable.   He was evaluated an hour prior where he was noted to have diffuse shaking of all extremities of unclear etiology (see Dr. Janne Napoleon note).  He received 1 mg of Ativan IV at that time.  His mental status appears to have worsened shortly after this.  Of note, he also received 1 dose of fentanyl 50 MCG in the ED for chest pain.  I think this is acute delirium from recent opiate and benzo intake during this admission. I do not see any indication to order brain imaging at this time as my suspicion for acute CVA is low. Will consider if he develops neurological deficits. I discontinued all centrally-acting agents, including the ones he takes at home. I added delirium precautions. Will continue to monitor. Will not medicate for agitation at this time as he is redirectable on exam.  If he were to become violent, will consider PO seroquel or risperidone.   Brian Roche, MD  Internal Medicine PGY-2  P [phone]

## 2018-10-04 NOTE — Progress Notes (Signed)
ANTICOAGULATION CONSULT NOTE - Initial Consult  Pharmacy Consult:  Xarelto Indication: atrial fibrillation  No Known Allergies  Patient Measurements: Height: 6' (182.9 cm) Weight: 240 lb (108.9 kg) IBW/kg (Calculated) : 77.6  Vital Signs: BP: 125/87 (01/25 1245) Pulse Rate: 89 (01/25 1330)  Labs: Recent Labs    10/04/18 1009 10/04/18 1332  HGB 13.7  --   HCT 43.5  --   PLT 180  --   CREATININE 0.87  --   TROPONINI  --  <0.03    Estimated Creatinine Clearance: 87.7 mL/min (by C-G formula based on SCr of 0.87 mg/dL).   Medical History: Past Medical History:  Diagnosis Date  . Allergic rhinitis   . Arthritis    SPINAL AND JOINTS  . Bladder polyp   . BPH (benign prostatic hypertrophy)   . Chronic back pain   . Diverticulosis   . Dyslipidemia    per pt questionable  . ED (erectile dysfunction) of organic origin   . GERD (gastroesophageal reflux disease)   . Glaucoma    BOTH EYES  . History of colon polyps   . History of diverticulitis of colon   . History of gastritis   . History of kidney stones    many yrs ago  . History of kidney stones   . 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 History of shingles   . Lower urinary tract symptoms (LUTS)   . OSA on CPAP    very severe per study 01-31-2006  . Peyronie's disease   . S/P insertion of spinal cord stimulator    2010  . Tingling    right arm;takes Gabapentin  . Type 2 diabetes mellitus (HCC)       Assessment: 35 YOM presented with chest pain, troponin negative.  Found to have new-onset Afib and Pharmacy consulted to dose Xarelto.  Renal clearance appropriate for full dosing and CBC is WNL.  Goal of Therapy:  Appropriate anticoagulation Monitor platelets by anticoagulation protocol: Yes   Plan:  Xarelto 20mg  PO daily with supper Pharmacy will sign off and follow peripherally.  Thank you for the consult!   Autrey Human D. Mina Marble, PharmD, BCPS,  [place] 10/04/2018, 2:44 PM

## 2018-10-04 NOTE — Progress Notes (Signed)
PT Cancellation Note  Patient Details Name: Brian Ellison MRN: [id number] DOB: August 16, 1941   Cancelled Treatment:     Active chest pain, not medically ready/appropriate for PT at this time   Duncan Dull 10/04/2018, 4:17 PM

## 2018-10-04 NOTE — Progress Notes (Signed)
Paged to bedside for uncontrolled shaking, tachypnea and tachycardic into the 130s. Telemetry reviewed which showed him to be in afib with RVR. Upon entering the room, Brian Ellison was shaking uncontrollably but stated he did not feel cold. He notes this happened to him 3 years ago, lasted about an hour and resolved spontaneously. No one at that time was able to tell him what the etiology was, but told him it may be due to eating red meat. He denies any current chest pain or shortness of breath.   On exam he is tachycardic, tachypneic, saturating well on 2L. He was fully alert and interactive. He was noted to be shaking uncontrollably predominantly in upper extremities.   We remained at bedside as IV metoprolol 5 mg was given which subsequently improved heart rate down to the 90s. Ordered PO Metoprolol and Xarelto to be given (ordered around 3 pm but never given), as well as 0.5 mg Ativan for shaking.   Nurse repeated oral temperature while we were at bedside, and it had trended up from 98>101.   A/P 1. Afib with RVR - continue IV metoprolol prn - PO metop 12.5 BID  2. Fever, rigors - patient has no obvious signs of infection; suspect the fever is a result of prolonged rigors. But given that we have know clear etiology, will start treatment with Rocephin and Azithromycin - continue to monitor closely   Signed: Delice Bison, DO 10/04/2018, 7:19 PM

## 2018-10-04 NOTE — Discharge Instructions (Signed)

## 2018-10-04 NOTE — ED Triage Notes (Signed)
Pt with c/o CP that started yesterday afternoon; pt states pain is in central chest and hurts upon inspiration and movement. Pt states that pain is sharp pain and it started while he was sitting in chair after dinner.

## 2018-10-04 NOTE — Progress Notes (Addendum)
Received patient to floor -  Patient having 10 out of 10 chest pain.   Called MD-  Obtained vital signs, a repeat EKG- gave 2 doses of Nitro with no relief.     Paged MD again- awaiting transfer and return call  Gave 3rd dose nitro- no relief.  MD aware

## 2018-10-05 ENCOUNTER — Inpatient Hospital Stay (HOSPITAL_COMMUNITY): Payer: Medicare Other

## 2018-10-05 ENCOUNTER — Encounter (HOSPITAL_COMMUNITY): Payer: Self-pay | Admitting: *Deleted

## 2018-10-05 ENCOUNTER — Other Ambulatory Visit: Payer: Self-pay

## 2018-10-05 DIAGNOSIS — G4733 Obstructive sleep apnea (adult) (pediatric): Secondary | ICD-10-CM

## 2018-10-05 DIAGNOSIS — E785 Hyperlipidemia, unspecified: Secondary | ICD-10-CM | POA: Diagnosis not present

## 2018-10-05 DIAGNOSIS — Z87891 Personal history of nicotine dependence: Secondary | ICD-10-CM

## 2018-10-05 DIAGNOSIS — E119 Type 2 diabetes mellitus without complications: Secondary | ICD-10-CM | POA: Diagnosis not present

## 2018-10-05 DIAGNOSIS — Z9989 Dependence on other enabling machines and devices: Secondary | ICD-10-CM | POA: Diagnosis not present

## 2018-10-05 DIAGNOSIS — Z7901 Long term (current) use of anticoagulants: Secondary | ICD-10-CM

## 2018-10-05 DIAGNOSIS — J189 Pneumonia, unspecified organism: Secondary | ICD-10-CM | POA: Diagnosis not present

## 2018-10-05 DIAGNOSIS — Z79899 Other long term (current) drug therapy: Secondary | ICD-10-CM | POA: Diagnosis not present

## 2018-10-05 DIAGNOSIS — R9431 Abnormal electrocardiogram [ECG] [EKG]: Secondary | ICD-10-CM | POA: Diagnosis not present

## 2018-10-05 DIAGNOSIS — J181 Lobar pneumonia, unspecified organism: Secondary | ICD-10-CM

## 2018-10-05 DIAGNOSIS — I4891 Unspecified atrial fibrillation: Secondary | ICD-10-CM

## 2018-10-05 DIAGNOSIS — J9601 Acute respiratory failure with hypoxia: Secondary | ICD-10-CM | POA: Diagnosis not present

## 2018-10-05 DIAGNOSIS — Z7982 Long term (current) use of aspirin: Secondary | ICD-10-CM | POA: Diagnosis not present

## 2018-10-05 DIAGNOSIS — Z6832 Body mass index (BMI) 32.0-32.9, adult: Secondary | ICD-10-CM | POA: Diagnosis not present

## 2018-10-05 DIAGNOSIS — I2510 Atherosclerotic heart disease of native coronary artery without angina pectoris: Secondary | ICD-10-CM | POA: Diagnosis not present

## 2018-10-05 DIAGNOSIS — K219 Gastro-esophageal reflux disease without esophagitis: Secondary | ICD-10-CM | POA: Diagnosis not present

## 2018-10-05 LAB — RESPIRATORY PANEL BY PCR
Adenovirus: NOT DETECTED
BORDETELLA PERTUSSIS-RVPCR: NOT DETECTED
Chlamydophila pneumoniae: NOT DETECTED
Coronavirus 229E: NOT DETECTED
Coronavirus HKU1: NOT DETECTED
Coronavirus NL63: NOT DETECTED
Coronavirus OC43: NOT DETECTED
INFLUENZA A-RVPPCR: NOT DETECTED
Influenza B: NOT DETECTED
Metapneumovirus: NOT DETECTED
Mycoplasma pneumoniae: NOT DETECTED
PARAINFLUENZA VIRUS 4-RVPPCR: NOT DETECTED
Parainfluenza Virus 1: NOT DETECTED
Parainfluenza Virus 2: NOT DETECTED
Parainfluenza Virus 3: NOT DETECTED
RESPIRATORY SYNCYTIAL VIRUS-RVPPCR: NOT DETECTED
Rhinovirus / Enterovirus: NOT DETECTED

## 2018-10-05 LAB — GLUCOSE, CAPILLARY
Glucose-Capillary: 192 mg/dL — ABNORMAL HIGH (ref 70–99)
Glucose-Capillary: 177 mg/dL — ABNORMAL HIGH (ref 70–99)

## 2018-10-05 LAB — RAPID URINE DRUG SCREEN, HOSP PERFORMED
Amphetamines: NOT DETECTED
Barbiturates: NOT DETECTED
Benzodiazepines: NOT DETECTED
Cocaine: NOT DETECTED
Opiates: NOT DETECTED
Tetrahydrocannabinol: NOT DETECTED

## 2018-10-05 LAB — ECHOCARDIOGRAM COMPLETE
HEIGHTINCHES: 72 in
Weight: 3840 oz

## 2018-10-05 LAB — TROPONIN I: Troponin I: <0.03 ng/mL (ref <0.03)

## 2018-10-05 LAB — STREP PNEUMONIAE URINARY ANTIGEN: Strep Pneumo Urinary Antigen: NEGATIVE

## 2018-10-05 LAB — LACTIC ACID, PLASMA: Lactic Acid, Venous: 1.2 mmol/L (ref 0.5–1.9)

## 2018-10-05 MED ORDER — AZITHROMYCIN 500 MG PO TABS: 500.0000 mg | ORAL_TABLET | Freq: Every day | ORAL | 0 refills | Status: AC

## 2018-10-05 MED ORDER — RIVAROXABAN 20 MG PO TABS: 20.0000 mg | ORAL_TABLET | Freq: Every day | ORAL | 0 refills | Status: DC

## 2018-10-05 MED ORDER — METOPROLOL TARTRATE 25 MG PO TABS: 12.5000 mg | ORAL_TABLET | Freq: Two times a day (BID) | ORAL | 0 refills | Status: DC

## 2018-10-05 NOTE — Care Management CC44 (Signed)
Condition Code 44 Documentation Completed  Patient Details  Name: CORNELIUS MARULLO MRN: [id number] Date of Birth: 06-11-1941   Condition Code 44 given:  Yes Patient signature on Condition Code 44 notice:  Yes Documentation of 2 MD's agreement:  Yes Code 44 added to claim:  Yes    Claudie Leach, RN 10/05/2018, 1:18 PM

## 2018-10-05 NOTE — Progress Notes (Signed)
Patient is ready for discharge. He is alert and oriented and vital signs are stable. He has all of his belongings with him. Discharge instructions have been reviewed with patient and all questions regarding discharge have been answered. IV has been removed, catheter intact, patient tolerated well. Telemetry has been removed, CCMD notified. Patient will be transported home by his son. He will leave unit on foot, he refused wheelchair and he will walk to front entrance of the hospital with a nurse to meet his son.

## 2018-10-05 NOTE — Evaluation (Signed)
Physical Therapy Evaluation Patient Details Name: Brian Ellison MRN: [id number] DOB: Jan 14, 1941 Today's Date: 10/05/2018   History of Present Illness  78 y/o gentleman with PMHx DM, OSA presents with acute onset substernal chest pain that radiates into both shoulders   Clinical Impression  Patient seen for therapy assessment.  Mobilizing well with no noted focal deficits at this time and VSS denies CP or SOB. Educated patient on safety with mobility. Reports adequate assist at home and baseline instability at times. Feels he is back to his normal functional status. No further acute PT needs. Will sign off.     Follow Up Recommendations No PT follow up;Supervision/Assistance - 24 hour    Equipment Recommendations  None recommended by PT    Recommendations for Other Services       Precautions / Restrictions        Mobility  Bed Mobility Overal bed mobility: Modified Independent             General bed mobility comments: no physical assist required  Transfers Overall transfer level: Modified independent Equipment used: None             General transfer comment: modest instability but able to self correct no physical assist required  Ambulation/Gait Ambulation/Gait assistance: Supervision Gait Distance (Feet): 230 Feet Assistive device: None Gait Pattern/deviations: Antalgic Gait velocity: decreased   General Gait Details: modest instability but patient reports this is baseline given history of knee issues  Stairs            Wheelchair Mobility    Modified Rankin (Stroke Patients Only)       Balance Overall balance assessment: Mild deficits observed, not formally tested                                           Pertinent Vitals/Pain Pain Assessment: No/denies pain    Home Living Family/patient expects to be discharged to:: Private residence Living Arrangements: Spouse/significant other Available Help at Discharge:  Family;Available 24 hours/day Type of Home: House Home Access: Level entry     Home Layout: One level        Prior Function Level of Independence: Independent               Hand Dominance   Dominant Hand: Right    Extremity/Trunk Assessment   Upper Extremity Assessment Upper Extremity Assessment: Overall WFL for tasks assessed    Lower Extremity Assessment Lower Extremity Assessment: Generalized weakness       Communication   Communication: No difficulties  Cognition Arousal/Alertness: Awake/alert Behavior During Therapy: WFL for tasks assessed/performed Overall Cognitive Status: Within Functional Limits for tasks assessed                                        General Comments      Exercises     Assessment/Plan    PT Assessment Patent does not need any further PT services  PT Problem List         PT Treatment Interventions      PT Goals (Current goals can be found in the Care Plan section)  Acute Rehab PT Goals PT Goal Formulation: All assessment and education complete, DC therapy    Frequency     Barriers to discharge  Co-evaluation               AM-PAC PT "6 Clicks" Mobility  Outcome Measure Help needed turning from your back to your side while in a flat bed without using bedrails?: None Help needed moving from lying on your back to sitting on the side of a flat bed without using bedrails?: None Help needed moving to and from a bed to a chair (including a wheelchair)?: None Help needed standing up from a chair using your arms (e.g., wheelchair or bedside chair)?: A Little Help needed to walk in hospital room?: A Little Help needed climbing 3-5 steps with a railing? : A Little 6 Click Score: 21    End of Session Equipment Utilized During Treatment: Gait belt;Oxygen Activity Tolerance: Patient tolerated treatment well Patient left: in chair;with call bell/phone within reach;with chair alarm set Nurse  Communication: Mobility status PT Visit Diagnosis: Difficulty in walking, not elsewhere classified (R26.2)    Time: 2633-3545 PT Time Calculation (min) (ACUTE ONLY): 20 min   Charges:   PT Evaluation $PT Eval Moderate Complexity: 1 Mod          Alben Deeds, PT DPT  Board Certified Neurologic Specialist Acute Rehabilitation Services Pager [phone] Office [phone]   Duncan Dull 10/05/2018, 12:37 PM

## 2018-10-05 NOTE — Progress Notes (Addendum)
   Subjective: Mr. Guimond was seen and evaluated at bedside. He did not have any further issues overnight after the shaking and episode of confusion after receiving ativan. He is feeling well this morning. His chest pain has resolved, and he is able to take full breaths without difficulty. He denies shortness of breath or palpitations.  We discussed that we are treating him with antibiotics for pneumonia. Also discussed new medications for afib that he will be on, including risk factors of anticoagulation. He will need to establish care with a cardiologist when he is discharged.    Objective:  Vital signs in last 24 hours: Vitals:   10/04/18 2330 10/05/18 0400 10/05/18 0433 10/05/18 0721  BP: 117/71  116/60 122/77  Pulse: 93 81 100 90  Resp: (!) 32 (!) 22 (!) 27 (!) 22  Temp: 99.4 F (37.4 C)  99 F (37.2 C) 98.2 F (36.8 C)  TempSrc: Oral  Oral Oral  SpO2: 94% 97% 96% 93%  Weight:      Height:       General: awake, alert, sitting up in his chair in NAD CV: irregularly irregular rhythm; normal rate Pulm: normal respiratory effort; saturating on room air; lungs CTA bilaterally Abd: BS+; abdomen is soft, non-distended, non-tender  Assessment/Plan:  Principal Problem:   Chest pain Active Problems:   Obstructive sleep apnea   Atrial fibrillation ([place])  1. CAP - patient developed a fever shortly after admission, along with rigors. With these findings, along with pleuritic chest pain, he was initiated on antibiotics for pneumonia. His CT findings also support possible LLL pneumonia - he has significant clinical improvement this morning, and chest pain has resolved  - RVP pending  - urinary strep antigen negative; will transition him to azithromycin to complete 5 day course  2. New onset afib - work-up negative for acute triggers thus far including PE, ACS, thyroid disease, pericarditis - likely in the setting of advanced age and OSA - he is currently rate controlled on Metoprolol  12.5 mg BID - started on Xarelto for anticoagulation - awaiting results of echo to rule out valvular disease; if this is negative, can be discharged with outpatient referral to cardiology for close follow-up   3. Type II DM - CBGs stable - will resume home regimen at discharge  4. OSA - continue CPAP qhs   Dispo: Patient is medically stable for discharge pending normal findings on echo.   Modena Nunnery D, DO 10/05/2018, 11:02 AM Pager: [phone]

## 2018-10-05 NOTE — Care Management (Signed)
Pt given Xarelto card. Explained that patient will need to fill on day of d/c (likely today) at a local pharmacy. Pt usually gets scripts at [place].  Pt has Medicare with Supplement and also reports he has a drug plan, in addition to his [facility] benefit.  Pt confirmed that he would f/u with VA and other insurances to get Xarelto as inexpensively as possible.

## 2018-10-05 NOTE — Progress Notes (Signed)
  Date: 10/05/2018  Patient name: Brian Ellison  Medical record number: [id number]  Date of birth: Sep 13, 1940   I have seen and evaluated Lafonda Mosses and discussed their care with the Residency Team. Briefly, Mr. Peral is a 78 year old man who presented with atypical chest pain which was evaluated for ACS; troponin X 3 have been negative and his chest pain is resolved today.  He was found to be in Afib with RVR at times, and then rate controlled (new diagnosis for him).  He was further found to have a fever and LLL infiltrate concerning for early pneumonia.  He is being evaluated for urinary antigens and a viral panel. He had a CT scan in the ED which did not show any PE. TTE performed this morning.    PMHx, Fam Hx, and/or Soc Hx : FH of heart disease.  + former smoker, quit > 40 years ago  Vitals:   10/05/18 0433 10/05/18 0721  BP: 116/60 122/77  Pulse: 100 90  Resp: (!) 27 (!) 22  Temp: 99 F (37.2 C) 98.2 F (36.8 C)  SpO2: 96% 93%   General: Sitting up in chair, NAD Eyes: Anicteric sclerae, no conjunctival injection HENT: Neck is supple CV: Irreg Irreg, mildly tachycardic, no murmur or rub Pulm: CTAB, no wheezing Abd: Obese, soft, +BS Ext: No edema  Assessment and Plan: I have seen and evaluated the patient as outlined above. I agree with the formulated Assessment and Plan as detailed in the residents' note, with the following changes:   1. CAP - Likely cause of fever and infiltrate on CXR.  Symptoms are somewhat atypical.  - Urinary antigen for strep pneumo is negative - Respiratory virus panel is pending - Can d/c cephalosporin and continue Zpack only for 5 day course  2. New Onset Afib, start date unknown - Metoprolol 12.5mg  BID - rate control - Xarelto started, Chads vasc score of 3 - would indicate anticoagulation is appropriate - TTE was reviewed, diastolic dysfunction noted - Will need outpatient follow up with cardiology  Can possibly be discharged today if  patient is amenable.   Further issues per Dr. Janne Napoleon daily note.   Sid Falcon, MD 1/26/202010:30 AM

## 2018-10-05 NOTE — Progress Notes (Signed)
Echocardiogram 2D Echocardiogram has been performed.  Brian Ellison 10/05/2018, 8:49 AM

## 2018-10-06 NOTE — Discharge Summary (Signed)
Name: Brian Ellison MRN: [id number] DOB: Nov 05, 1940 78 y.o. PCP: [facility], [facility]  Date of Admission: 10/04/2018  9:49 AM Date of Discharge: 10/05/2018 Attending Physician: Sid Falcon, MD  Discharge Diagnosis: 1. CAP  2. New onset afib   Discharge Medications: Allergies as of 10/05/2018   No Known Allergies     Medication List    STOP taking these medications   aspirin 81 MG tablet     TAKE these medications   ARTIFICIAL TEAR SOLUTION OP Place 1 drop into both eyes daily as needed (dry eyes).   azithromycin 500 MG tablet Commonly known as:  ZITHROMAX Take 1 tablet (500 mg total) by mouth daily for 4 days.   brimonidine 0.15 % ophthalmic solution Commonly known as:  ALPHAGAN Place 1 drop into both eyes 2 (two) times daily.   cetirizine 10 MG tablet Commonly known as:  ZYRTEC Take 10 mg by mouth daily.   fluticasone 50 MCG/ACT nasal spray Commonly known as:  FLONASE Place 1 spray into both nostrils at bedtime.   gabapentin 600 MG tablet Commonly known as:  NEURONTIN Take 600 mg by mouth 2 (two) times daily.   glipiZIDE 5 MG tablet Commonly known as:  GLUCOTROL Take 2.5 mg by mouth daily before breakfast.   ibuprofen 200 MG tablet Commonly known as:  ADVIL,MOTRIN Take 600 mg by mouth at bedtime as needed for headache or moderate pain.   metFORMIN 500 MG tablet Commonly known as:  GLUCOPHAGE Take 500-1,000 mg by mouth See admin instructions. 1000 mg in the morning and 500 mg in the evening   metoprolol tartrate 25 MG tablet Commonly known as:  LOPRESSOR Take 0.5 tablets (12.5 mg total) by mouth 2 (two) times daily for 30 days.   pantoprazole 40 MG tablet Commonly known as:  PROTONIX Take 40 mg by mouth daily.   rivaroxaban 20 MG Tabs tablet Commonly known as:  XARELTO Take 1 tablet (20 mg total) by mouth daily with supper for 30 days.   rOPINIRole 1 MG tablet Commonly known as:  REQUIP Take 1 mg by mouth at bedtime.   vitamin B-12  500 MCG tablet Commonly known as:  CYANOCOBALAMIN Take 850 mcg by mouth daily.       Disposition and follow-up:   Brian Ellison [place] was discharged from [facility] in Good condition.  At the hospital follow up visit please address:  1.  CAP: urinary strep antigen was negative so he was discharged on 5 day course of azithromycin alone. Repeat imaging in 4-6 weeks to ensure resolution.  New onset afib: discharged on Metoprolol 12.5 mg BID for rate control and started on Xarelto for anticoagulation. Please ensure he has cardiology follow-up.   2.  Labs / imaging needed at time of follow-up: repeat CXR or CT in 4-6 weeks    3.  Pending labs/ test needing follow-up: none   Follow-up Appointments: Follow-up Information    Clinic, [facility]. Schedule an appointment as soon as possible for a visit in 1 week(s).   Contact information: [place] [address] [phone]           Hospital Course by problem list: 1. CAP: Brian Ellison initially presented for severe pleuritic chest pain. CT of the chest had findings indicative of LLL pneumonia. Shortly after admission, he developed fever along with severe rigors. He was started on IV ceftriaxone and azithromycin. By the following morning, he had significantly improved with resolution in  fever, chills and chest pain. Urinary strep antigen and RVP were negative so he was transitioned to oral azithromycin alone to complete a 5 day course.   2. New onset afib: Shortly after admission, patient went into RVR when his fever and rigors developed which responded well to IV labetalol. Work-up was negative for acute triggers including PE, ACS, thyroid disease and pericarditis. Echo did not show evidence of valvular disease. This is likely in the setting of advanced age and OSA. He was started on Metoprolol 12.5 mg BID for rate control and Xarelto for anticoagulation. He was referred to cardiology for  outpatient follow-up.   Discharge Vitals:   BP 112/72 (BP Location: Right Arm)   Pulse 80   Temp 98.3 F (36.8 C) (Oral)   Resp 18   Ht 6' (1.829 m)   Wt 108.9 kg   SpO2 90%   BMI 32.55 kg/m   Pertinent Labs, Studies, and Procedures:  TTE Study Conclusions  - Left ventricle: The cavity size was normal. There was moderate   concentric hypertrophy. Systolic function was normal. The   estimated ejection fraction was in the range of 60% to 65%. Wall   motion was normal; there were no regional wall motion   abnormalities. Findings consistent with left ventricular   diastolic dysfunction, grade indeterminate. - Aortic valve: Mildly calcified annulus. Trileaflet; mildly   thickened, mildly calcified leaflets. Sclerosis without stenosis. - Aorta: Ascending aortic diameter: 39 mm (S). - Mitral valve: Mildly calcified annulus. - Left atrium: The atrium was mildly dilated. - Systemic veins: IVC is dilated with normal respiratory variation.   Estimated RAP: 8 mmHg.  Chest CT angio IMPRESSION: 1. No evidence of pulmonary embolism. 2. Coronary atherosclerosis with mild amount of calcified plaque in a 3 vessel distribution. 3. Bibasilar atelectasis, left greater than right. Early pneumonia at the left lung base can not be excluded.  Discharge Instructions: Discharge Instructions    Ambulatory referral to Cardiology   Complete by:  As directed    New onset afib   Call MD for:  difficulty breathing, headache or visual disturbances   Complete by:  As directed    Call MD for:  persistant dizziness or light-headedness   Complete by:  As directed    Call MD for:  severe uncontrolled pain   Complete by:  As directed    Diet - low sodium heart healthy   Complete by:  As directed    Discharge instructions   Complete by:  As directed    Brian Ellison, I am glad you are feeling better. It was a pleasure taking care of you! You were treated in the hospital for pneumonia. You will continue  taking the antibiotics for 4 more days.  You were also started on 2 new medications for the abnormal heart rhythm we found called atrial fibrillation. You will take Metoprolol twice daily to control your heart rate and the Xarelto once every day. This is a blood thinner that will decrease your risk of stroke. It is important that you establish care with a heart doctor. I have placed the referral for this. Please make an appointment with your primary care doctor within a week to follow-up on your hospital stay.  Take care! Dr. Koleen Distance   Increase activity slowly   Complete by:  As directed       Signed: Delice Bison, DO 10/09/2018, 4:36 PM   Pager: [phone]

## 2018-10-09 LAB — CULTURE, BLOOD (ROUTINE X 2)
CULTURE: NO GROWTH
Culture: NO GROWTH
SPECIAL REQUESTS: ADEQUATE

## 2018-10-14 DIAGNOSIS — J45909 Unspecified asthma, uncomplicated: Secondary | ICD-10-CM | POA: Diagnosis not present

## 2018-10-14 DIAGNOSIS — R071 Chest pain on breathing: Secondary | ICD-10-CM | POA: Diagnosis not present

## 2018-10-14 DIAGNOSIS — R0602 Shortness of breath: Secondary | ICD-10-CM | POA: Diagnosis not present

## 2018-10-14 DIAGNOSIS — E119 Type 2 diabetes mellitus without complications: Secondary | ICD-10-CM | POA: Diagnosis not present

## 2018-10-14 DIAGNOSIS — J189 Pneumonia, unspecified organism: Secondary | ICD-10-CM | POA: Diagnosis not present

## 2018-10-14 DIAGNOSIS — J181 Lobar pneumonia, unspecified organism: Secondary | ICD-10-CM | POA: Diagnosis not present

## 2018-10-16 DIAGNOSIS — R760 Raised antibody titer: Secondary | ICD-10-CM | POA: Diagnosis not present

## 2018-10-16 DIAGNOSIS — H401132 Primary open-angle glaucoma, bilateral, moderate stage: Secondary | ICD-10-CM | POA: Diagnosis not present

## 2018-10-16 DIAGNOSIS — E119 Type 2 diabetes mellitus without complications: Secondary | ICD-10-CM | POA: Diagnosis not present

## 2018-10-17 ENCOUNTER — Encounter (HOSPITAL_COMMUNITY): Payer: Self-pay

## 2018-10-18 ENCOUNTER — Other Ambulatory Visit: Payer: Self-pay | Admitting: Family Medicine

## 2018-10-18 DIAGNOSIS — M542 Cervicalgia: Secondary | ICD-10-CM

## 2018-10-23 DIAGNOSIS — I509 Heart failure, unspecified: Secondary | ICD-10-CM | POA: Diagnosis not present

## 2018-10-23 DIAGNOSIS — J181 Lobar pneumonia, unspecified organism: Secondary | ICD-10-CM | POA: Diagnosis not present

## 2018-10-23 DIAGNOSIS — R0602 Shortness of breath: Secondary | ICD-10-CM | POA: Diagnosis not present

## 2018-10-23 DIAGNOSIS — R7982 Elevated C-reactive protein (CRP): Secondary | ICD-10-CM | POA: Diagnosis not present

## 2018-10-23 DIAGNOSIS — M542 Cervicalgia: Secondary | ICD-10-CM | POA: Diagnosis not present

## 2018-10-23 DIAGNOSIS — E119 Type 2 diabetes mellitus without complications: Secondary | ICD-10-CM | POA: Diagnosis not present

## 2018-10-23 DIAGNOSIS — R0609 Other forms of dyspnea: Secondary | ICD-10-CM | POA: Diagnosis not present

## 2018-10-27 ENCOUNTER — Encounter (HOSPITAL_COMMUNITY): Payer: Self-pay

## 2018-10-27 ENCOUNTER — Ambulatory Visit
Admission: RE | Admit: 2018-10-27 | Discharge: 2018-10-27 | Disposition: A | Discharge status: Home / Self Care | Payer: Medicare Other | Source: Ambulatory Visit | Attending: Family Medicine | Admitting: Family Medicine

## 2018-10-27 DIAGNOSIS — M50222 Other cervical disc displacement at C5-C6 level: Secondary | ICD-10-CM | POA: Diagnosis not present

## 2018-10-27 DIAGNOSIS — M47812 Spondylosis without myelopathy or radiculopathy, cervical region: Secondary | ICD-10-CM | POA: Diagnosis not present

## 2018-10-27 DIAGNOSIS — M542 Cervicalgia: Secondary | ICD-10-CM

## 2018-10-27 DIAGNOSIS — M50223 Other cervical disc displacement at C6-C7 level: Secondary | ICD-10-CM | POA: Diagnosis not present

## 2018-10-27 NOTE — Progress Notes (Signed)
Cardiology Office Note    Date:  10/28/2018   ID:  Brian Ellison, DOB 10-22-1940, MRN [id number]  PCP:  [facility]  Cardiologist:  Fransico Him, MD   Chief Complaint  Patient presents with  . Atrial Fibrillation  . Chest Pain    History of Present Illness:  Brian Ellison is a 78 y.o. male who is being seen today for the evaluation of new onset atrial fibrillation at the request of Sid Falcon, MD.  This is a 77yo male with a history of Dm, OSA and hyperlipidemia who was admitted to [facility] 10/04/2018 with complaints of CP with radiation to shoulders bilaterally while sitting in a chair.  Pain was worse with inspiration and associated with SOB.  Pain was sharp and stabbing. Chest CT neg for PE but showed mild 3v CAD.  He was dx with LLL PNA by chest CT treated with antibx.  He then developed chills, rigors and fever and developed afib with RVR treated with Lopressor 12.5mg  BID.  Echo showed normal LVF with no signficant valvular heart dz.  There was mild LAE.  TSH was normal.  He was started on Xarelto 20mg  daily for CHADS2VAS score of 3.  He converted back to NSR the next day.     He is now here for evaluation. He denies any chest pain or pressure, PND, orthopnea, LE edema, dizziness, palpitations or syncope.  He is still having shortness of breath and says that he feels back to where he was before he went into the hospital as far as the shortness of breath goes.  He denies any chest pain or pressure.  He cannot tell that he is in an irregular rhythm.  He is compliant with his meds and is tolerating meds with no SE.    Past Medical History:  Diagnosis Date  . Allergic rhinitis   . Arthritis    SPINAL AND JOINTS  . Bladder polyp   . BPH (benign prostatic hypertrophy)   . Chronic back pain   . Diverticulosis   . Dyslipidemia    per pt questionable  . ED (erectile dysfunction) of organic origin   . GERD (gastroesophageal reflux disease)   . Glaucoma    BOTH EYES  .  History of colon polyps   . History of diverticulitis of colon   . History of gastritis   . History of kidney stones    many yrs ago  . History of kidney stones   . 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 History of shingles   . Lower urinary tract symptoms (LUTS)   . OSA on CPAP    very severe per study 01-31-2006  . Peyronie's disease   . S/P insertion of spinal cord stimulator    2010  . Tingling    right arm;takes Gabapentin  . Type 2 diabetes mellitus ([place])     Past Surgical History:  Procedure Laterality Date  . ANTERIOR CERVICAL DECOMP/DISCECTOMY FUSION  06-16-2004   C3 -- C5  . APPENDECTOMY  age 85  . CATARACT EXTRACTION W/ INTRAOCULAR LENS  IMPLANT, BILATERAL    . COLONOSCOPY    . CYSTOSCOPY WITH BIOPSY N/A 07/15/2015   Procedure: CYSTOSCOPY WITH BLADDER AND PROSTATE BIOPSY;  Surgeon: Festus Aloe, MD;  Location[facility];  Service: Urology;  Laterality: N/A;  . HAND SURGERY Right 2017   "right hand middle finger knuckle  replaced"  . INGUINAL HERNIA REPAIR Bilateral 1970's  . NASAL SEPTUM SURGERY  yrs ago  . PENILE PROSTHESIS IMPLANT  11-26-2007  . PERMANANT SPINAL CORD STIMULATOR IMPLANT VIA THORACIC LAMINECTOMY  05-12-2009   Medtronic generator (left buttock)  . POSTERIOR LAMINECTOMY / DECOMPRESSION CERVICAL SPINE  03-08-2005   C3  -- C6  . RETINAL DETACHMENT SURGERY Right   . REVERSE SHOULDER ARTHROPLASTY Left 09/23/2015   Procedure: LEFT REVERSE TOTAL SHOULDER ARTHROPLASTY;  Surgeon: Netta Cedars, MD;  Location: [facility];  Service: Orthopedics;  Laterality: Left;  . REVISION TOTAL ARTHROPLASTY RIGHT SHOULDER  07-22-2009  . RIGHT HEEL SURGERY     spurs  . RIGHT SHOULDER HEMIARTHROPLASTY  08-20-2008  . ROTATOR CUFF REPAIR Bilateral x1 right/  x2  left - last one 2013  . SHOULDER OPEN ROTATOR CUFF REPAIR Bilateral   . SPINAL CORD STIMULATOR BATTERY EXCHANGE N/A 07/29/2018   Procedure: Spinal  cord stimulator/implantable pulse generator battery change;  Surgeon: Erline Levine, MD;  Location: [facility];  Service: Neurosurgery;  Laterality: N/A;  Spinal cord stimulator/implantable pulse generator battery change  . TONSILLECTOMY    . TOTAL KNEE ARTHROPLASTY Right 04/17/2013   Procedure: RIGHT TOTAL KNEE ARTHROPLASTY;  Surgeon: Augustin Schooling, MD;  Location: [facility];  Service: Orthopedics;  Laterality: Right;  . TOTAL KNEE ARTHROPLASTY Left 02-10-2009    Current Medications: Current Meds  Medication Sig  . ARTIFICIAL TEAR SOLUTION OP Place 1 drop into both eyes daily as needed (dry eyes).  . brimonidine (ALPHAGAN) 0.15 % ophthalmic solution Place 1 drop into both eyes 2 (two) times daily.  . cetirizine (ZYRTEC) 10 MG tablet Take 10 mg by mouth daily.  . fluticasone (FLONASE) 50 MCG/ACT nasal spray Place 1 spray into both nostrils at bedtime.  . furosemide (LASIX) 20 MG tablet Take 20 mg by mouth daily.  Marland Kitchen gabapentin (NEURONTIN) 600 MG tablet Take 600 mg by mouth 2 (two) times daily.   Marland Kitchen glipiZIDE (GLUCOTROL) 5 MG tablet Take 2.5 mg by mouth daily before breakfast.  . ibuprofen (ADVIL,MOTRIN) 200 MG tablet Take 600 mg by mouth at bedtime as needed for headache or moderate pain.  . meloxicam (MOBIC) 7.5 MG tablet Take 7.5 mg by mouth 2 (two) times daily.  . metFORMIN (GLUCOPHAGE) 500 MG tablet Take 500-1,000 mg by mouth See admin instructions. 1000 mg in the morning and 500 mg in the evening  . metoprolol tartrate (LOPRESSOR) 25 MG tablet Take 0.5 tablets (12.5 mg total) by mouth 2 (two) times daily for 30 days.  Marland Kitchen moxifloxacin (AVELOX) 400 MG tablet Take 400 mg by mouth daily.  . pantoprazole (PROTONIX) 40 MG tablet Take 40 mg by mouth daily.  . rivaroxaban (XARELTO) 20 MG TABS tablet Take 1 tablet (20 mg total) by mouth daily with supper for 30 days.  Marland Kitchen rOPINIRole (REQUIP) 1 MG tablet Take 1 mg by mouth at bedtime.    . vitamin B-12 (CYANOCOBALAMIN) 500 MCG tablet Take 850 mcg by mouth  daily.     Allergies:   Patient has no known allergies.   Social History   Socioeconomic History  . Marital status: Married    Spouse name: Not on file  . Number of children: 3  . Years of education: 61  . Highest education level: Not on file  Occupational History  . Not on file  Social Needs  . Financial resource strain: Not hard at all  . Food insecurity:    Worry: Never true  Inability: Never true  . Transportation needs:    Medical: No    Non-medical: No  Tobacco Use  . Smoking status: Former Smoker    Packs/day: 1.00    Years: 15.00    Pack years: 15.00    Types: Cigarettes    Last attempt to quit: 07/08/1971    Years since quitting: 47.3  . Smokeless tobacco: Never Used  Substance and Sexual Activity  . Alcohol use: Yes    Comment: rare  . Drug use: No  . Sexual activity: Not on file  Lifestyle  . Physical activity:    Days per week: 0 days    Minutes per session: 0 min  . Stress: Only a little  Relationships  . Social connections:    Talks on phone: Three times a week    Gets together: Once a week    Attends religious service: More than 4 times per year    Active member of club or organization: No    Attends meetings of clubs or organizations: Never    Relationship status: Married  Other Topics Concern  . Not on file  Social History Narrative  . Not on file     Family History:  The patient's family history is not on file.   ROS:   Please see the history of present illness.    ROS All other systems reviewed and are negative.  No flowsheet data found.     PHYSICAL EXAM:   VS:  BP 132/78   Pulse 85   Ht 6' (1.829 m)   Wt 244 lb 6.4 oz (110.9 kg)   BMI 33.15 kg/m    GEN: Well nourished, well developed, in no acute distress  HEENT: normal  Neck: no JVD, carotid bruits, or masses Cardiac: Irregularly irregular; no murmurs, rubs, or gallops,no edema.  Intact distal pulses bilaterally.  Respiratory:  clear to auscultation bilaterally,  normal work of breathing GI: soft, nontender, nondistended, + BS MS: no deformity or atrophy  Skin: warm and dry, no rash Neuro:  Alert and Oriented x 3, Strength and sensation are intact Psych: euthymic mood, full affect  Wt Readings from Last 3 Encounters:  10/28/18 244 lb 6.4 oz (110.9 kg)  10/04/18 240 lb (108.9 kg)  07/21/18 244 lb 12.8 oz (111 kg)      Studies/Labs Reviewed:   EKG:  EKG is ordered today and showed atrial flutter with CVR Recent Labs: 10/04/2018: ALT 31; B Natriuretic Peptide 84.2; BUN 10; Creatinine, Ser 0.87; Hemoglobin 13.7; Platelets 180; Potassium 4.0; Sodium 140; TSH 0.807   Lipid Panel    Component Value Date/Time   CHOL 157 10/04/2018 1332   TRIG 52 10/04/2018 1332   HDL 51 10/04/2018 1332   CHOLHDL 3.1 10/04/2018 1332   VLDL 10 10/04/2018 1332   LDLCALC 96 10/04/2018 1332    Additional studies/ records that were reviewed today include:  Hospital notes, 2D echo    ASSESSMENT:    1. PAF (paroxysmal atrial fibrillation) (HCC)   2. Chest pain, unspecified type   3. Morbid (severe) obesity due to excess calories ([place])      PLAN:  In order of problems listed above:  1.  Paroxysmal atrial fibrillation - this was in the setting of fever, rigors and PNA.  He converted with BB within 24 hours of onset.  He is now back in atrial flutter with a controlled ventricular response.  He will continue on Lopressor 12.5mg  BID and Xarelto 20mg  daily.  2D  echo showed mild LAE and normal LVF.  He is not sure if he is missed any doses of Xarelto since his discharge.  He is complaining of shortness of breath and his lungs are clear so I do not think it is from residual pneumonia.  I am suspicious that the shortness of breath is related to his atrial fibrillation.  Going to refer him to A. fib clinic for further recommendations.  Creatinine was 0.87, potassium 4 and hemoglobin 13.7 on 10/04/2018.  2.  Chest pain - this was atypical and pleuritic and likely related  to PNA.  This has resolved with treatment of PNA.  He denies any exertional CP.  2D echo with normal LVF.  Trop was negative x 3.  No further cardiac workup at this time.   3.  Morbid obesity - discussed with patient that obesity is a risk factor for PAF and encouraged him to get into a routine exercise plan as well as cut back on carbs and complex sugars to try to lose some weight.     Medication Adjustments/Labs and Tests Ordered: Current medicines are reviewed at length with the patient today.  Concerns regarding medicines are outlined above.  Medication changes, Labs and Tests ordered today are listed in the Patient Instructions below.  There are no Patient Instructions on file for this visit.   Signed, Fransico Him, MD  10/28/2018 1:02 PM    [address], [place], [place]  [address] Phone: [phone]; Fax: [phone]

## 2018-10-28 ENCOUNTER — Encounter: Payer: Self-pay | Admitting: Cardiology

## 2018-10-28 ENCOUNTER — Ambulatory Visit (INDEPENDENT_AMBULATORY_CARE_PROVIDER_SITE_OTHER): Payer: Medicare Other | Admitting: Cardiology

## 2018-10-28 VITALS — BP 132/78 | HR 85 | Ht 72.0 in | Wt 244.4 lb

## 2018-10-28 DIAGNOSIS — I480 Paroxysmal atrial fibrillation: Secondary | ICD-10-CM

## 2018-10-28 DIAGNOSIS — R079 Chest pain, unspecified: Secondary | ICD-10-CM

## 2018-10-28 NOTE — Patient Instructions (Addendum)
Medication Instructions:  Your provider recommends that you continue on your current medications as directed. Please refer to the Current Medication list given to you today.    Labwork: None  Testing/Procedures: None  Follow-Up: You have been referred to Roderic Palau in the [facility]. You are scheduled TOMORROW, 10/29/2018, at 2:30PM. Please arrive by 2:15PM.  This is located at the [facility] Building at [facility]. The entrance is off [address]. The parking code is [id number].  Your provider wants you to follow-up in: 6 months with Dr. Radford Pax or her assistant. You will receive a reminder letter in the mail two months in advance. If you don't receive a letter, please call our office to schedule the follow-up appointment.

## 2018-10-29 ENCOUNTER — Ambulatory Visit (HOSPITAL_COMMUNITY): Payer: Medicare Other | Admitting: Nurse Practitioner

## 2018-10-29 ENCOUNTER — Ambulatory Visit (HOSPITAL_COMMUNITY)
Admission: RE | Admit: 2018-10-29 | Discharge: 2018-10-29 | Disposition: A | Discharge status: Home / Self Care | Payer: Medicare Other | Source: Ambulatory Visit | Attending: Nurse Practitioner | Admitting: Nurse Practitioner

## 2018-10-29 ENCOUNTER — Encounter (HOSPITAL_COMMUNITY): Payer: Self-pay | Admitting: Nurse Practitioner

## 2018-10-29 VITALS — BP 110/68 | HR 88 | Ht 72.0 in | Wt 250.0 lb

## 2018-10-29 DIAGNOSIS — Z8701 Personal history of pneumonia (recurrent): Secondary | ICD-10-CM | POA: Diagnosis not present

## 2018-10-29 DIAGNOSIS — Z87891 Personal history of nicotine dependence: Secondary | ICD-10-CM | POA: Insufficient documentation

## 2018-10-29 DIAGNOSIS — Z7984 Long term (current) use of oral hypoglycemic drugs: Secondary | ICD-10-CM | POA: Insufficient documentation

## 2018-10-29 DIAGNOSIS — E785 Hyperlipidemia, unspecified: Secondary | ICD-10-CM | POA: Diagnosis not present

## 2018-10-29 DIAGNOSIS — E119 Type 2 diabetes mellitus without complications: Secondary | ICD-10-CM | POA: Diagnosis not present

## 2018-10-29 DIAGNOSIS — G4733 Obstructive sleep apnea (adult) (pediatric): Secondary | ICD-10-CM | POA: Insufficient documentation

## 2018-10-29 DIAGNOSIS — E669 Obesity, unspecified: Secondary | ICD-10-CM | POA: Insufficient documentation

## 2018-10-29 DIAGNOSIS — N400 Enlarged prostate without lower urinary tract symptoms: Secondary | ICD-10-CM | POA: Insufficient documentation

## 2018-10-29 DIAGNOSIS — Z86010 Personal history of colonic polyps: Secondary | ICD-10-CM | POA: Diagnosis not present

## 2018-10-29 DIAGNOSIS — I2510 Atherosclerotic heart disease of native coronary artery without angina pectoris: Secondary | ICD-10-CM | POA: Diagnosis not present

## 2018-10-29 DIAGNOSIS — Z87442 Personal history of urinary calculi: Secondary | ICD-10-CM | POA: Insufficient documentation

## 2018-10-29 DIAGNOSIS — N529 Male erectile dysfunction, unspecified: Secondary | ICD-10-CM | POA: Diagnosis not present

## 2018-10-29 DIAGNOSIS — Z791 Long term (current) use of non-steroidal anti-inflammatories (NSAID): Secondary | ICD-10-CM | POA: Diagnosis not present

## 2018-10-29 DIAGNOSIS — K219 Gastro-esophageal reflux disease without esophagitis: Secondary | ICD-10-CM | POA: Diagnosis not present

## 2018-10-29 DIAGNOSIS — Z6833 Body mass index (BMI) 33.0-33.9, adult: Secondary | ICD-10-CM | POA: Insufficient documentation

## 2018-10-29 DIAGNOSIS — I480 Paroxysmal atrial fibrillation: Secondary | ICD-10-CM | POA: Diagnosis not present

## 2018-10-29 DIAGNOSIS — Z7901 Long term (current) use of anticoagulants: Secondary | ICD-10-CM | POA: Diagnosis not present

## 2018-10-29 DIAGNOSIS — Z79899 Other long term (current) drug therapy: Secondary | ICD-10-CM | POA: Insufficient documentation

## 2018-10-29 DIAGNOSIS — H409 Unspecified glaucoma: Secondary | ICD-10-CM | POA: Diagnosis not present

## 2018-10-29 MED ORDER — METOPROLOL TARTRATE 25 MG PO TABS: 12.5000 mg | ORAL_TABLET | Freq: Two times a day (BID) | ORAL | 6 refills | Status: DC

## 2018-10-29 NOTE — Progress Notes (Signed)
Primary Care Physician: [facility] Primary Cardiologist: Dr Radford Pax Referring Physician: Dr Ansel Bong is a 78 y.o. male with a history of DM, OSA, and hyperlipidemia with new onset paroxysmal atrial fibrillation who presents for consultation in the [facility].  The patient was initially diagnosed with atrial fibrillation on 10/04/18 after presenting with symptoms of CP and SOB to the ER. He was found to have PNA and was started on antibiotics. He subsequently developed afib with RVR which converted the next day without AAD or DCCV. He was started on Lopressor 12.5 mg BID and Xarelto 20 mg daily. At follow up with Dr Radford Pax, he was found to be in atrial flutter vs coarse afib. He reports his SOB has improved since his hospitalization but it is not quite back to baseline. He denies alcohol use or history of rheumatic fever. He does have sleep apnea and has been compliant with his CPAP for 10+ years.  Today, he denies symptoms of palpitations, chest pain, orthopnea, PND, lower extremity edema, dizziness, presyncope, syncope, snoring, daytime somnolence, bleeding, or neurologic sequela. The patient is tolerating medications without difficulties and is otherwise without complaint today.    Atrial Fibrillation Risk Factors:  he does have symptoms or diagnosis of sleep apnea. he is compliant with CPAP therapy. he does not have a history of rheumatic fever. he does not have a history of alcohol use. The patient does not have a history of early familial atrial fibrillation or other arrhythmias.  he has a BMI of Body mass index is 33.91 kg/m.Marland Kitchen Filed Weights   10/29/18 1434  Weight: 113.4 kg    No family history on file.   Atrial Fibrillation Management history:  Previous antiarrhythmic drugs: none Previous cardioversions: none Previous ablations: none CHADS2VASC score: (age, DM, CAD) Anticoagulation history: Xarelto   Past Medical  History:  Diagnosis Date  . Allergic rhinitis   . Arthritis    SPINAL AND JOINTS  . Bladder polyp   . BPH (benign prostatic hypertrophy)   . Chronic back pain   . Diverticulosis   . Dyslipidemia    per pt questionable  . ED (erectile dysfunction) of organic origin   . GERD (gastroesophageal reflux disease)   . Glaucoma    BOTH EYES  . History of colon polyps   . History of diverticulitis of colon   . History of gastritis   . History of kidney stones    many yrs ago  . History of kidney stones   . 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 History of shingles   . Lower urinary tract symptoms (LUTS)   . OSA on CPAP    very severe per study 01-31-2006  . Peyronie's disease   . S/P insertion of spinal cord stimulator    2010  . Tingling    right arm;takes Gabapentin  . Type 2 diabetes mellitus ([place])    Past Surgical History:  Procedure Laterality Date  . ANTERIOR CERVICAL DECOMP/DISCECTOMY FUSION  06-16-2004   C3 -- C5  . APPENDECTOMY  age 43  . CATARACT EXTRACTION W/ INTRAOCULAR LENS  IMPLANT, BILATERAL    . COLONOSCOPY    . CYSTOSCOPY WITH BIOPSY N/A 07/15/2015   Procedure: CYSTOSCOPY WITH BLADDER AND PROSTATE BIOPSY;  Surgeon: Festus Aloe, MD;  Location: [facility];  Service: Urology;  Laterality: N/A;  . HAND SURGERY Right 2017   "  right hand middle finger knuckle replaced"  . INGUINAL HERNIA REPAIR Bilateral 1970's  . NASAL SEPTUM SURGERY  yrs ago  . PENILE PROSTHESIS IMPLANT  11-26-2007  . PERMANANT SPINAL CORD STIMULATOR IMPLANT VIA THORACIC LAMINECTOMY  05-12-2009   Medtronic generator (left buttock)  . POSTERIOR LAMINECTOMY / DECOMPRESSION CERVICAL SPINE  03-08-2005   C3  -- C6  . RETINAL DETACHMENT SURGERY Right   . REVERSE SHOULDER ARTHROPLASTY Left 09/23/2015   Procedure: LEFT REVERSE TOTAL SHOULDER ARTHROPLASTY;  Surgeon: Netta Cedars, MD;  Location: [facility];  Service: Orthopedics;   Laterality: Left;  . REVISION TOTAL ARTHROPLASTY RIGHT SHOULDER  07-22-2009  . RIGHT HEEL SURGERY     spurs  . RIGHT SHOULDER HEMIARTHROPLASTY  08-20-2008  . ROTATOR CUFF REPAIR Bilateral x1 right/  x2  left - last one 2013  . SHOULDER OPEN ROTATOR CUFF REPAIR Bilateral   . SPINAL CORD STIMULATOR BATTERY EXCHANGE N/A 07/29/2018   Procedure: Spinal cord stimulator/implantable pulse generator battery change;  Surgeon: Erline Levine, MD;  Location: [place];  Service: Neurosurgery;  Laterality: N/A;  Spinal cord stimulator/implantable pulse generator battery change  . TONSILLECTOMY    . TOTAL KNEE ARTHROPLASTY Right 04/17/2013   Procedure: RIGHT TOTAL KNEE ARTHROPLASTY;  Surgeon: Augustin Schooling, MD;  Location: [place];  Service: Orthopedics;  Laterality: Right;  . TOTAL KNEE ARTHROPLASTY Left 02-10-2009    Current Outpatient Medications  Medication Sig Dispense Refill  . ARTIFICIAL TEAR SOLUTION OP Place 1 drop into both eyes daily as needed (dry eyes).    . brimonidine (ALPHAGAN) 0.15 % ophthalmic solution Place 1 drop into both eyes 2 (two) times daily.    . cetirizine (ZYRTEC) 10 MG tablet Take 10 mg by mouth daily.    . fluticasone (FLONASE) 50 MCG/ACT nasal spray Place 1 spray into both nostrils at bedtime.    . furosemide (LASIX) 20 MG tablet Take 20 mg by mouth daily.    Marland Kitchen gabapentin (NEURONTIN) 600 MG tablet Take 600 mg by mouth 2 (two) times daily.     Marland Kitchen glipiZIDE (GLUCOTROL) 5 MG tablet Take 2.5 mg by mouth daily before breakfast.    . ibuprofen (ADVIL,MOTRIN) 200 MG tablet Take 600 mg by mouth at bedtime as needed for headache or moderate pain.    . meloxicam (MOBIC) 7.5 MG tablet Take 7.5 mg by mouth 2 (two) times daily.    . metFORMIN (GLUCOPHAGE) 500 MG tablet Take 500-1,000 mg by mouth See admin instructions. 1000 mg in the morning and 500 mg in the evening    . metoprolol tartrate (LOPRESSOR) 25 MG tablet Take 0.5 tablets (12.5 mg total) by mouth 2 (two) times daily. 30 tablet 6  .  moxifloxacin (AVELOX) 400 MG tablet Take 400 mg by mouth daily.    . pantoprazole (PROTONIX) 40 MG tablet Take 40 mg by mouth daily.    . rivaroxaban (XARELTO) 20 MG TABS tablet Take 1 tablet (20 mg total) by mouth daily with supper for 30 days. 30 tablet 0  . rOPINIRole (REQUIP) 1 MG tablet Take 1 mg by mouth at bedtime.      . vitamin B-12 (CYANOCOBALAMIN) 500 MCG tablet Take 850 mcg by mouth daily.      No current facility-administered medications for this encounter.     No Known Allergies  Social History   Socioeconomic History  . Marital status: Married    Spouse name: Not on file  . Number of children: 3  . Years of education: 12  .  Highest education level: Not on file  Occupational History  . Not on file  Social Needs  . Financial resource strain: Not hard at all  . Food insecurity:    Worry: Never true    Inability: Never true  . Transportation needs:    Medical: No    Non-medical: No  Tobacco Use  . Smoking status: Former Smoker    Packs/day: 1.00    Years: 15.00    Pack years: 15.00    Types: Cigarettes    Last attempt to quit: 07/08/1971    Years since quitting: 47.3  . Smokeless tobacco: Never Used  Substance and Sexual Activity  . Alcohol use: Yes    Comment: rare  . Drug use: No  . Sexual activity: Not on file  Lifestyle  . Physical activity:    Days per week: 0 days    Minutes per session: 0 min  . Stress: Only a little  Relationships  . Social connections:    Talks on phone: Three times a week    Gets together: Once a week    Attends religious service: More than 4 times per year    Active member of club or organization: No    Attends meetings of clubs or organizations: Never    Relationship status: Married  . Intimate partner violence:    Fear of current or ex partner: No    Emotionally abused: No    Physically abused: No    Forced sexual activity: No  Other Topics Concern  . Not on file  Social History Narrative  . Not on file      ROS- All systems are reviewed and negative except as per the HPI above.  Physical Exam: Vitals:   10/29/18 1434  BP: 110/68  Pulse: 88  SpO2: 95%  Weight: 113.4 kg  Height: 6' (1.829 m)    GEN- The patient is well appearing elderly male, alert and oriented x 3 today.   Head- normocephalic, atraumatic Eyes-  Sclera clear, conjunctiva pink Ears- hearing intact Oropharynx- clear Neck- supple  Lungs- Clear to ausculation bilaterally, normal work of breathing Heart- irregular rate and rhythm, no murmurs, rubs or gallops  GI- soft, NT, ND, + BS Extremities- no clubbing, cyanosis, or edema MS- no significant deformity or atrophy Skin- no rash or lesion Psych- euthymic mood, full affect Neuro- strength and sensation are intact  Wt Readings from Last 3 Encounters:  10/29/18 113.4 kg  10/28/18 110.9 kg  10/04/18 108.9 kg    EKG today demonstrates atrial fibrillation HR 88, QRS 80, QTc manually corrected to 409  Echo 10/05/18 demonstrated  - Left ventricle: The cavity size was normal. There was moderate   concentric hypertrophy. Systolic function was normal. The   estimated ejection fraction was in the range of 60% to 65%. Wall   motion was normal; there were no regional wall motion   abnormalities. Findings consistent with left ventricular   diastolic dysfunction, grade indeterminate. - Aortic valve: Mildly calcified annulus. Trileaflet; mildly   thickened, mildly calcified leaflets. Sclerosis without stenosis. - Aorta: Ascending aortic diameter: 39 mm (S). - Mitral valve: Mildly calcified annulus. - Left atrium: The atrium was mildly dilated. - Systemic veins: IVC is dilated with normal respiratory variation.   Estimated RAP: 8 mmHg. LA 36mm  Epic records are reviewed at length today  Assessment and Plan:  1. Paroxysmal atrial fibrillation The patient has paroxysmal atrial fibrillation which was diagnosed in the setting of PNA. He has  not noticed any heart racing  or palpitations. He does admit to dyspnea on exertion which is complicated by his recent PNA. We discussed therapeutic options including Tikosyn, amiodarone, vs rate control. He does not want Tikosyn because his wife had an episode of torsades de pointes while being loaded on the medication. He would like to take some time to consider amiodarone after discussing risks and benefits of the medication. If his afib becomes persistent, we can consider DCCV. We discussed lifestyle modifications including regular physical activity, weight loss, and CPAP compliance. Recent TSH normal. Recent echo showed preserved EF Continue Lopressor 12.5 mg BID for rate control for now. Continue Xarelto 20 mg daily. If patient cannot get this filled at the [place], may need to consider warfarin.  Check Bmet/CBC on f/u  This patients CHA2DS2-VASc Score and unadjusted Ischemic Stroke Rate (% per year) is equal to 4.8 % stroke rate/year from a score of 4  Above score calculated as 1 point each if present [CHF, HTN, DM, Vascular=MI/PAD/Aortic Plaque, Age if 65-74, or Male] Above score calculated as 2 points each if present [Age > 75, or Stroke/TIA/TE]   2. Obesity Body mass index is 33.91 kg/m. As above, lifestyle modification was discussed at length including regular exercise and weight reduction.  3. Obstructive sleep apnea The importance of adequate treatment of sleep apnea was discussed today in order to improve our ability to maintain sinus rhythm long term.  4. CAD Mild 3v disease by CT. No anginal symptoms. Followed by Dr Radford Pax.   Follow up in Afib clinic in 3-4 weeks.   [facility] [address] [place], [place] [address] [phone] 10/29/2018 3:33 PM

## 2018-11-04 ENCOUNTER — Inpatient Hospital Stay (HOSPITAL_COMMUNITY): Admission: RE | Admit: 2018-11-04 | Payer: Medicare Other | Source: Ambulatory Visit

## 2018-11-05 ENCOUNTER — Telehealth: Payer: Self-pay | Admitting: Internal Medicine

## 2018-11-05 ENCOUNTER — Other Ambulatory Visit (HOSPITAL_COMMUNITY): Payer: Self-pay | Admitting: Family Medicine

## 2018-11-05 DIAGNOSIS — G4733 Obstructive sleep apnea (adult) (pediatric): Secondary | ICD-10-CM

## 2018-11-05 DIAGNOSIS — G451 Carotid artery syndrome (hemispheric): Secondary | ICD-10-CM

## 2018-11-05 NOTE — Telephone Encounter (Signed)
Returned phone call to patient, he states he was recently DX with A-fib and it has caused rapid breathing. He states the cpap mask that he has which only covers his nose has not been able to keep up with his fast breathing. He was recently seen in ED 02/25. Patient is able to speak completed sentences, does not sound like he is in distress, he states he feels fine but would like a different cpap mask. Denies SOB or chest discomfort. He states his breathing pattern has just changed since his new Dx. Patient denies any other symptoms.   Beth please advise if we can send order for a full face mask as well as if patient needs appt for his rapid respirations. Thanks.

## 2018-11-05 NOTE — Telephone Encounter (Signed)
LMTCB

## 2018-11-05 NOTE — Telephone Encounter (Signed)
I'm fine with full face mask. Concerned about this rapid breathing and afib. Does he have a cardiology follow-up?

## 2018-11-06 ENCOUNTER — Telehealth (HOSPITAL_COMMUNITY): Payer: Self-pay | Admitting: *Deleted

## 2018-11-06 ENCOUNTER — Ambulatory Visit (HOSPITAL_COMMUNITY)
Admission: RE | Admit: 2018-11-06 | Discharge: 2018-11-06 | Disposition: A | Discharge status: Home / Self Care | Payer: Medicare Other | Source: Ambulatory Visit | Attending: Cardiology | Admitting: Cardiology

## 2018-11-06 ENCOUNTER — Telehealth: Payer: Self-pay | Admitting: Cardiology

## 2018-11-06 DIAGNOSIS — G451 Carotid artery syndrome (hemispheric): Secondary | ICD-10-CM

## 2018-11-06 MED ORDER — AMIODARONE HCL 200 MG PO TABS: 200.0000 mg | ORAL_TABLET | Freq: Two times a day (BID) | ORAL | 0 refills | Status: DC

## 2018-11-06 NOTE — Telephone Encounter (Signed)
Called and spoke with Patient.  Geraldo Pitter, NP, recommendations given. Understanding stated. Patient stated that he sees Dr Radford Pax for cardiology and she is aware.  DME order placed for full face mask, with [facility].  Nothing further at this time.

## 2018-11-06 NOTE — Telephone Encounter (Signed)
Patient is returning phone call.  Patient phone number is [phone].

## 2018-11-06 NOTE — Telephone Encounter (Signed)
Pt c/o Shortness Of Breath: STAT if SOB developed within the last 24 hours or pt is noticeably SOB on the phone  1. Are you currently SOB (can you hear that pt is SOB on the phone)? not at this time  2. How long have you been experiencing SOB? -been going for about 3 weeks- diagnosed with Atrial Fib at that time 3. Are you SOB when sitting or when up moving around? When he gets up and move around     4. Are you currently experiencing any other symptoms? a deep cough for some reason    Pt said he was supposed to take Amiodarone, but he is not taking it.

## 2018-11-06 NOTE — Telephone Encounter (Signed)
He was just seen in A. fib clinic recently and options discussed with Ellison at that time including starting amiodarone. He told the PA in A. fib clinic that he wanted to go home and think about it. He needs to discuss this with them and should give them a call. They A. fib is likely contributing to shortness of breath but please order a Lexiscan Myoview to rule out ischemia.   Brian Ellison

## 2018-11-06 NOTE — Telephone Encounter (Signed)
ATC pt, no answer and there was not an option to leave a message. Will try back.

## 2018-11-06 NOTE — Telephone Encounter (Signed)
Patient called in stating he is now ready to proceed with amiodarone loading - he is having increased shortness of breath and has developed a nagging cough (no sputum/fever). His weight trend over the course of the last month has been increasing per our records. Discussed with Adline Peals, PA will increase lasix to 40mg  a day for the next 3 days then return to normal dosing. Will start Amiodarone 200mg  BID and follow up next week for repeat EKG/assessment. Pt in agreement.

## 2018-11-06 NOTE — Telephone Encounter (Signed)
Spoke with the patient, he is going to call the afib clinic and discuss amiodarone. I advised the patient that Dr. Radford Pax wanted to order a Lexiscan stress test. He stated he wanted to wait and call afib clinic to see what they say. I advised him to contact us if he runs into any trouble.

## 2018-11-11 DIAGNOSIS — I509 Heart failure, unspecified: Secondary | ICD-10-CM | POA: Diagnosis not present

## 2018-11-11 DIAGNOSIS — I10 Essential (primary) hypertension: Secondary | ICD-10-CM | POA: Diagnosis not present

## 2018-11-11 DIAGNOSIS — E119 Type 2 diabetes mellitus without complications: Secondary | ICD-10-CM | POA: Diagnosis not present

## 2018-11-11 DIAGNOSIS — G4733 Obstructive sleep apnea (adult) (pediatric): Secondary | ICD-10-CM | POA: Diagnosis not present

## 2018-11-11 DIAGNOSIS — E8770 Fluid overload, unspecified: Secondary | ICD-10-CM | POA: Diagnosis not present

## 2018-11-12 ENCOUNTER — Encounter: Payer: Self-pay | Admitting: Physician Assistant

## 2018-11-12 ENCOUNTER — Ambulatory Visit (HOSPITAL_COMMUNITY)
Admission: RE | Admit: 2018-11-12 | Discharge: 2018-11-12 | Disposition: A | Discharge status: Home / Self Care | Payer: Medicare Other | Source: Ambulatory Visit | Attending: Physician Assistant | Admitting: Physician Assistant

## 2018-11-12 ENCOUNTER — Other Ambulatory Visit (HOSPITAL_COMMUNITY): Payer: Self-pay | Admitting: *Deleted

## 2018-11-12 ENCOUNTER — Encounter (HOSPITAL_COMMUNITY): Payer: Self-pay | Admitting: Physician Assistant

## 2018-11-12 VITALS — BP 110/70 | HR 77 | Ht 72.0 in | Wt 250.0 lb

## 2018-11-12 DIAGNOSIS — N400 Enlarged prostate without lower urinary tract symptoms: Secondary | ICD-10-CM | POA: Insufficient documentation

## 2018-11-12 DIAGNOSIS — Z87442 Personal history of urinary calculi: Secondary | ICD-10-CM | POA: Diagnosis not present

## 2018-11-12 DIAGNOSIS — Z7984 Long term (current) use of oral hypoglycemic drugs: Secondary | ICD-10-CM | POA: Insufficient documentation

## 2018-11-12 DIAGNOSIS — I480 Paroxysmal atrial fibrillation: Secondary | ICD-10-CM | POA: Diagnosis not present

## 2018-11-12 DIAGNOSIS — E669 Obesity, unspecified: Secondary | ICD-10-CM | POA: Diagnosis not present

## 2018-11-12 DIAGNOSIS — Z86010 Personal history of colonic polyps: Secondary | ICD-10-CM | POA: Insufficient documentation

## 2018-11-12 DIAGNOSIS — Z791 Long term (current) use of non-steroidal anti-inflammatories (NSAID): Secondary | ICD-10-CM | POA: Diagnosis not present

## 2018-11-12 DIAGNOSIS — Z9682 Presence of neurostimulator: Secondary | ICD-10-CM | POA: Diagnosis not present

## 2018-11-12 DIAGNOSIS — E119 Type 2 diabetes mellitus without complications: Secondary | ICD-10-CM | POA: Insufficient documentation

## 2018-11-12 DIAGNOSIS — R0602 Shortness of breath: Secondary | ICD-10-CM | POA: Diagnosis not present

## 2018-11-12 DIAGNOSIS — G4733 Obstructive sleep apnea (adult) (pediatric): Secondary | ICD-10-CM | POA: Insufficient documentation

## 2018-11-12 DIAGNOSIS — Z79899 Other long term (current) drug therapy: Secondary | ICD-10-CM | POA: Diagnosis not present

## 2018-11-12 DIAGNOSIS — H409 Unspecified glaucoma: Secondary | ICD-10-CM | POA: Insufficient documentation

## 2018-11-12 DIAGNOSIS — R0609 Other forms of dyspnea: Secondary | ICD-10-CM | POA: Insufficient documentation

## 2018-11-12 DIAGNOSIS — Z87891 Personal history of nicotine dependence: Secondary | ICD-10-CM | POA: Diagnosis not present

## 2018-11-12 DIAGNOSIS — N529 Male erectile dysfunction, unspecified: Secondary | ICD-10-CM | POA: Diagnosis not present

## 2018-11-12 DIAGNOSIS — Z6833 Body mass index (BMI) 33.0-33.9, adult: Secondary | ICD-10-CM | POA: Diagnosis not present

## 2018-11-12 DIAGNOSIS — E785 Hyperlipidemia, unspecified: Secondary | ICD-10-CM | POA: Insufficient documentation

## 2018-11-12 DIAGNOSIS — R0600 Dyspnea, unspecified: Secondary | ICD-10-CM | POA: Diagnosis not present

## 2018-11-12 DIAGNOSIS — K219 Gastro-esophageal reflux disease without esophagitis: Secondary | ICD-10-CM | POA: Insufficient documentation

## 2018-11-12 DIAGNOSIS — I2510 Atherosclerotic heart disease of native coronary artery without angina pectoris: Secondary | ICD-10-CM | POA: Insufficient documentation

## 2018-11-12 DIAGNOSIS — Z7901 Long term (current) use of anticoagulants: Secondary | ICD-10-CM | POA: Insufficient documentation

## 2018-11-12 DIAGNOSIS — R05 Cough: Secondary | ICD-10-CM | POA: Diagnosis not present

## 2018-11-12 LAB — BASIC METABOLIC PANEL
Anion gap: 9 (ref 5–15)
BUN: 11 mg/dL (ref 8–23)
CO2: 25 mmol/L (ref 22–32)
Calcium: 8.9 mg/dL (ref 8.9–10.3)
Chloride: 104 mmol/L (ref 98–111)
Creatinine, Ser: 0.9 mg/dL (ref 0.61–1.24)
GFR calc non Af Amer: >60 mL/min (ref >60)
Glucose, Bld: 132 mg/dL — ABNORMAL HIGH (ref 70–99)
POTASSIUM: 3.3 mmol/L — AB (ref 3.5–5.1)
Sodium: 138 mmol/L (ref 135–145)

## 2018-11-12 LAB — BRAIN NATRIURETIC PEPTIDE: B Natriuretic Peptide: 333 pg/mL — ABNORMAL HIGH (ref 0.0–100.0)

## 2018-11-12 MED ORDER — AMIODARONE HCL 200 MG PO TABS: 200.0000 mg | ORAL_TABLET | Freq: Every day | ORAL | 0 refills | Status: DC

## 2018-11-12 MED ORDER — POTASSIUM CHLORIDE ER 10 MEQ PO TBCR: 10.0000 meq | EXTENDED_RELEASE_TABLET | Freq: Every day | ORAL | 3 refills | Status: AC

## 2018-11-12 NOTE — Patient Instructions (Addendum)
Scheduling will call to set up stress test  Decrease amiodarone to 200mg  once a day  Follow up with Dr. Radford Pax in 3-4 weeks.

## 2018-11-12 NOTE — Progress Notes (Signed)
Primary Care Physician: [facility] Primary Cardiologist: Dr Radford Pax Referring Physician: Dr Ansel Bong is a 78 y.o. male with a history of DM, OSA, and hyperlipidemia with new onset paroxysmal atrial fibrillation who presents for consultation in the [facility].  The patient was initially diagnosed with atrial fibrillation on 10/04/18 after presenting with symptoms of CP and SOB to the ER. He was found to have PNA and was started on antibiotics. He subsequently developed afib with RVR which converted the next day without AAD or DCCV. He was started on Lopressor 12.5 mg BID and Xarelto 20 mg daily. He reports that he is still having issues with dyspnea and cough despite being in SR today. His symptoms are worse in the morning. He denies any leg edema, orthopnea, or PND.   Today, he denies symptoms of palpitations, chest pain, orthopnea, PND, lower extremity edema, dizziness, presyncope, syncope, snoring, daytime somnolence, bleeding, or neurologic sequela. The patient is tolerating medications without difficulties and is otherwise without complaint today.    Atrial Fibrillation Risk Factors:  he does have symptoms or diagnosis of sleep apnea. he is compliant with CPAP therapy. he does not have a history of rheumatic fever. he does not have a history of alcohol use. The patient does not have a history of early familial atrial fibrillation or other arrhythmias.  he has a BMI of Body mass index is 33.91 kg/m.Marland Kitchen Filed Weights   11/12/18 1012  Weight: 113.4 kg    No family history on file.   Atrial Fibrillation Management history:  Previous antiarrhythmic drugs: none Previous cardioversions: none Previous ablations: none CHADS2VASC score: (age, DM, CAD) Anticoagulation history: Xarelto   Past Medical History:  Diagnosis Date  . Allergic rhinitis   . Arthritis    SPINAL AND JOINTS  . Bladder polyp   . BPH (benign prostatic  hypertrophy)   . Chronic back pain   . Diverticulosis   . Dyslipidemia    per pt questionable  . ED (erectile dysfunction) of organic origin   . GERD (gastroesophageal reflux disease)   . Glaucoma    BOTH EYES  . History of colon polyps   . History of diverticulitis of colon   . History of gastritis   . History of kidney stones    many yrs ago  . History of kidney stones   . 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 History of shingles   . Lower urinary tract symptoms (LUTS)   . OSA on CPAP    very severe per study 01-31-2006  . Peyronie's disease   . S/P insertion of spinal cord stimulator    2010  . Tingling    right arm;takes Gabapentin  . Type 2 diabetes mellitus ([place])    Past Surgical History:  Procedure Laterality Date  . ANTERIOR CERVICAL DECOMP/DISCECTOMY FUSION  06-16-2004   C3 -- C5  . APPENDECTOMY  age 72  . CATARACT EXTRACTION W/ INTRAOCULAR LENS  IMPLANT, BILATERAL    . COLONOSCOPY    . CYSTOSCOPY WITH BIOPSY N/A 07/15/2015   Procedure: CYSTOSCOPY WITH BLADDER AND PROSTATE BIOPSY;  Surgeon: Festus Aloe, MD;  Location: [facility];  Service: Urology;  Laterality: N/A;  . HAND SURGERY Right 2017   "right hand middle finger knuckle replaced"  . INGUINAL HERNIA REPAIR Bilateral 1970's  . NASAL SEPTUM SURGERY  yrs ago  .  PENILE PROSTHESIS IMPLANT  11-26-2007  . PERMANANT SPINAL CORD STIMULATOR IMPLANT VIA THORACIC LAMINECTOMY  05-12-2009   Medtronic generator (left buttock)  . POSTERIOR LAMINECTOMY / DECOMPRESSION CERVICAL SPINE  03-08-2005   C3  -- C6  . RETINAL DETACHMENT SURGERY Right   . REVERSE SHOULDER ARTHROPLASTY Left 09/23/2015   Procedure: LEFT REVERSE TOTAL SHOULDER ARTHROPLASTY;  Surgeon: Netta Cedars, MD;  Location: [facility];  Service: Orthopedics;  Laterality: Left;  . REVISION TOTAL ARTHROPLASTY RIGHT SHOULDER  07-22-2009  . RIGHT HEEL SURGERY     spurs  . RIGHT SHOULDER  HEMIARTHROPLASTY  08-20-2008  . ROTATOR CUFF REPAIR Bilateral x1 right/  x2  left - last one 2013  . SHOULDER OPEN ROTATOR CUFF REPAIR Bilateral   . SPINAL CORD STIMULATOR BATTERY EXCHANGE N/A 07/29/2018   Procedure: Spinal cord stimulator/implantable pulse generator battery change;  Surgeon: Erline Levine, MD;  Location: [place];  Service: Neurosurgery;  Laterality: N/A;  Spinal cord stimulator/implantable pulse generator battery change  . TONSILLECTOMY    . TOTAL KNEE ARTHROPLASTY Right 04/17/2013   Procedure: RIGHT TOTAL KNEE ARTHROPLASTY;  Surgeon: Augustin Schooling, MD;  Location: [place];  Service: Orthopedics;  Laterality: Right;  . TOTAL KNEE ARTHROPLASTY Left 02-10-2009    Current Outpatient Medications  Medication Sig Dispense Refill  . amiodarone (PACERONE) 200 MG tablet Take 1 tablet (200 mg total) by mouth daily. 60 tablet 0  . ARTIFICIAL TEAR SOLUTION OP Place 1 drop into both eyes daily as needed (dry eyes).    . brimonidine (ALPHAGAN) 0.15 % ophthalmic solution Place 1 drop into both eyes 2 (two) times daily.    . cetirizine (ZYRTEC) 10 MG tablet Take 10 mg by mouth daily.    . fluticasone (FLONASE) 50 MCG/ACT nasal spray Place 1 spray into both nostrils at bedtime.    . furosemide (LASIX) 20 MG tablet Take 20 mg by mouth daily.    Marland Kitchen gabapentin (NEURONTIN) 600 MG tablet Take 600 mg by mouth 2 (two) times daily.     Marland Kitchen glipiZIDE (GLUCOTROL) 5 MG tablet Take 2.5 mg by mouth daily before breakfast.    . ibuprofen (ADVIL,MOTRIN) 200 MG tablet Take 600 mg by mouth at bedtime as needed for headache or moderate pain.    . meloxicam (MOBIC) 7.5 MG tablet Take 7.5 mg by mouth 2 (two) times daily.    . metFORMIN (GLUCOPHAGE) 500 MG tablet Take 500-1,000 mg by mouth See admin instructions. 1000 mg in the morning and 500 mg in the evening    . metoprolol tartrate (LOPRESSOR) 25 MG tablet Take 0.5 tablets (12.5 mg total) by mouth 2 (two) times daily. 30 tablet 6  . pantoprazole (PROTONIX) 40 MG  tablet Take 40 mg by mouth daily.    . rivaroxaban (XARELTO) 20 MG TABS tablet Take 1 tablet (20 mg total) by mouth daily with supper for 30 days. 30 tablet 0  . rOPINIRole (REQUIP) 1 MG tablet Take 1 mg by mouth at bedtime.      . vitamin B-12 (CYANOCOBALAMIN) 500 MCG tablet Take 850 mcg by mouth daily.      No current facility-administered medications for this encounter.     No Known Allergies  Social History   Socioeconomic History  . Marital status: Married    Spouse name: Not on file  . Number of children: 3  . Years of education: 11  . Highest education level: Not on file  Occupational History  . Not on file  Social Needs  .  Financial resource strain: Not hard at all  . Food insecurity:    Worry: Never true    Inability: Never true  . Transportation needs:    Medical: No    Non-medical: No  Tobacco Use  . Smoking status: Former Smoker    Packs/day: 1.00    Years: 15.00    Pack years: 15.00    Types: Cigarettes    Last attempt to quit: 07/08/1971    Years since quitting: 47.3  . Smokeless tobacco: Never Used  Substance and Sexual Activity  . Alcohol use: Yes    Comment: rare  . Drug use: No  . Sexual activity: Not on file  Lifestyle  . Physical activity:    Days per week: 0 days    Minutes per session: 0 min  . Stress: Only a little  Relationships  . Social connections:    Talks on phone: Three times a week    Gets together: Once a week    Attends religious service: More than 4 times per year    Active member of club or organization: No    Attends meetings of clubs or organizations: Never    Relationship status: Married  . Intimate partner violence:    Fear of current or ex partner: No    Emotionally abused: No    Physically abused: No    Forced sexual activity: No  Other Topics Concern  . Not on file  Social History Narrative  . Not on file     ROS- All systems are reviewed and negative except as per the HPI above.  Physical Exam: Vitals:    11/12/18 1012  BP: 110/70  Pulse: 77  SpO2: 95%  Weight: 113.4 kg  Height: 6' (1.829 m)    GEN- The patient is well appearing, alert and oriented x 3 today.   HEENT-head normocephalic, atraumatic, sclera clear, conjunctiva pink, hearing intact, trachea midline. Lungs- Clear to ausculation bilaterally, normal work of breathing at rest, mildly labored while walking. Heart- Regular rate and rhythm, no murmurs, rubs or gallops  GI- soft, NT, ND, + BS Extremities- no clubbing, cyanosis, or edema MS- no significant deformity or atrophy Skin- no rash or lesion Psych- euthymic mood, full affect Neuro- strength and sensation are intact   Wt Readings from Last 3 Encounters:  11/12/18 113.4 kg  10/29/18 113.4 kg  10/28/18 110.9 kg    EKG today demonstrates SR HR 80, PAC, 1st degree Av block, PR 206, QRS 92, QTc 486  Echo 10/05/18 demonstrated  - Left ventricle: The cavity size was normal. There was moderate   concentric hypertrophy. Systolic function was normal. The   estimated ejection fraction was in the range of 60% to 65%. Wall   motion was normal; there were no regional wall motion   abnormalities. Findings consistent with left ventricular   diastolic dysfunction, grade indeterminate. - Aortic valve: Mildly calcified annulus. Trileaflet; mildly   thickened, mildly calcified leaflets. Sclerosis without stenosis. - Aorta: Ascending aortic diameter: 39 mm (S). - Mitral valve: Mildly calcified annulus. - Left atrium: The atrium was mildly dilated. - Systemic veins: IVC is dilated with normal respiratory variation.   Estimated RAP: 8 mmHg. LA 28mm  Epic records are reviewed at length today  Assessment and Plan:  1. Paroxysmal atrial fibrillation The patient has paroxysmal atrial fibrillation which was diagnosed in the setting of PNA.  Loaded on amiodarone 200 mg BID. He is now in [place]. Still have issues with dyspnea, see plans  below. Will decrease amiodarone to 200 mg  daily. Continue Lopressor 12.5 mg BID for rate control for now. Continue Xarelto 20 mg daily.  This patients CHA2DS2-VASc Score and unadjusted Ischemic Stroke Rate (% per year) is equal to 4.8 % stroke rate/year from a score of 4  Above score calculated as 1 point each if present [CHF, HTN, DM, Vascular=MI/PAD/Aortic Plaque, Age if 65-74, or Male] Above score calculated as 2 points each if present [Age > 75, or Stroke/TIA/TE]  2. Dyspnea on exertion Patient continues to have issues with dyspnea and cough despite conversion to SR. He has gained about 11 lbs since his hospital discharge in January. His lasix was just increased to 40 mg daily at his PCP yesterday with good urine output. Per Dr Theodosia Blender note, will order Leane Call to rule out ischemia. Check Bmet and BNP Check CXR for residual PNA  3. Obesity Body mass index is 33.91 kg/m. Lifestyle modifications encouraged including regular activity and weight loss.  4. Obstructive sleep apnea The importance of adequate treatment of sleep apnea was discussed today in order to improve our ability to maintain sinus rhythm long term.  5. CAD Mild 3v disease by CT. See plans under dyspnea.   Follow up with Dr Radford Pax in one month. Lexiscan pending.  [facility] [address] [place], [place] [address] [phone] 11/12/2018 11:11 AM

## 2018-11-13 ENCOUNTER — Telehealth (HOSPITAL_COMMUNITY): Payer: Self-pay | Admitting: *Deleted

## 2018-11-13 NOTE — Telephone Encounter (Signed)
He does not have a dx that would qualify and would have to pay out of pocket

## 2018-11-13 NOTE — Telephone Encounter (Signed)
Spoke with the patient, he expressed understanding.  

## 2018-11-13 NOTE — Telephone Encounter (Signed)
Pt called into Afib clinic today for some lab / xray results. Pt mentioned that his wife is in Cardiac Rehab and he would love to try to join in on that. Pt aware I would send his primary cardiologist a message with the requests. Pt understands that he may have to have an appointment, but will let Dr. Radford Pax decide.  Pt thanked me for the help.

## 2018-11-18 ENCOUNTER — Telehealth (HOSPITAL_COMMUNITY): Payer: Self-pay

## 2018-11-18 NOTE — Telephone Encounter (Signed)
Patient contacted and detailed instructions given, he stated that he understood and would be here for his test. S.Ashleyanne Hemmingway EMTP

## 2018-11-19 ENCOUNTER — Ambulatory Visit (HOSPITAL_COMMUNITY): Payer: Medicare Other | Admitting: Physician Assistant

## 2018-11-20 ENCOUNTER — Other Ambulatory Visit: Payer: Self-pay

## 2018-11-20 ENCOUNTER — Ambulatory Visit (HOSPITAL_COMMUNITY): Payer: Medicare Other | Attending: Cardiology

## 2018-11-20 DIAGNOSIS — R0609 Other forms of dyspnea: Secondary | ICD-10-CM | POA: Insufficient documentation

## 2018-11-20 LAB — MYOCARDIAL PERFUSION IMAGING
LV sys vol: 23 mL
LVDIAVOL: 64 mL (ref 62–150)
NUC STRESS TID: 1.06
Peak HR: 91 {beats}/min
Rest HR: 82 {beats}/min
SDS: 0
SRS: 0
SSS: 0

## 2018-11-20 MED ORDER — TECHNETIUM TC 99M TETROFOSMIN IV KIT
10.2000 | PACK | Freq: Once | INTRAVENOUS | Status: AC | PRN
  Administered 2018-11-20: 10.2 via INTRAVENOUS
  Filled 2018-11-20: qty 11

## 2018-11-20 MED ORDER — TECHNETIUM TC 99M TETROFOSMIN IV KIT
32.9000 | PACK | Freq: Once | INTRAVENOUS | Status: AC | PRN
  Administered 2018-11-20: 32.9 via INTRAVENOUS
  Filled 2018-11-20: qty 33

## 2018-11-20 MED ORDER — REGADENOSON 0.4 MG/5ML IV SOLN
0.4000 mg | Freq: Once | INTRAVENOUS | Status: AC
  Administered 2018-11-20: 0.4 mg via INTRAVENOUS

## 2018-12-02 DIAGNOSIS — I509 Heart failure, unspecified: Secondary | ICD-10-CM | POA: Diagnosis not present

## 2018-12-02 DIAGNOSIS — E785 Hyperlipidemia, unspecified: Secondary | ICD-10-CM | POA: Diagnosis not present

## 2018-12-02 DIAGNOSIS — I10 Essential (primary) hypertension: Secondary | ICD-10-CM | POA: Diagnosis not present

## 2018-12-02 DIAGNOSIS — E8770 Fluid overload, unspecified: Secondary | ICD-10-CM | POA: Diagnosis not present

## 2018-12-02 DIAGNOSIS — E119 Type 2 diabetes mellitus without complications: Secondary | ICD-10-CM | POA: Diagnosis not present

## 2018-12-22 ENCOUNTER — Ambulatory Visit: Payer: Medicare Other | Admitting: Cardiology

## 2018-12-25 ENCOUNTER — Ambulatory Visit: Payer: Medicare Other | Admitting: Internal Medicine

## 2019-01-02 ENCOUNTER — Other Ambulatory Visit (HOSPITAL_COMMUNITY): Payer: Self-pay | Admitting: *Deleted

## 2019-01-02 MED ORDER — AMIODARONE HCL 200 MG PO TABS: 200.0000 mg | ORAL_TABLET | Freq: Every day | ORAL | 1 refills | Status: DC

## 2019-01-29 DIAGNOSIS — H10411 Chronic giant papillary conjunctivitis, right eye: Secondary | ICD-10-CM | POA: Diagnosis not present

## 2019-02-05 DIAGNOSIS — H10411 Chronic giant papillary conjunctivitis, right eye: Secondary | ICD-10-CM | POA: Diagnosis not present

## 2019-02-06 ENCOUNTER — Ambulatory Visit: Payer: Medicare Other | Admitting: Internal Medicine

## 2019-02-08 DIAGNOSIS — I2510 Atherosclerotic heart disease of native coronary artery without angina pectoris: Secondary | ICD-10-CM | POA: Insufficient documentation

## 2019-02-08 NOTE — Progress Notes (Signed)
Virtual Visit via Telephone Note   This visit type was conducted due to national recommendations for restrictions regarding the COVID-19 Pandemic (e.g. social distancing) in an effort to limit this patient's exposure and mitigate transmission in our community.  Due to his co-morbid illnesses, this patient is at least at moderate risk for complications without adequate follow up.  This format is felt to be most appropriate for this patient at this time.  The patient did not have access to video technology/had technical difficulties with video requiring transitioning to audio format only (telephone).  All issues noted in this document were discussed and addressed.  No physical exam could be performed with this format.  Please refer to the patient's chart for his  consent to telehealth for [facility]   Evaluation Performed:  Followup  This visit type was conducted due to national recommendations for restrictions regarding the COVID-19 Pandemic (e.g. social distancing).  This format is felt to be most appropriate for this patient at this time.  All issues noted in this document were discussed and addressed.  No physical exam was performed (except for noted visual exam findings with Video Visits).  Please refer to the patient's chart (MyChart message for video visits and phone note for telephone visits) for the patient's consent to telehealth for [facility].  Date:  02/09/2019   ID:  Brian Ellison, DOB December 26, 1940, MRN [id number]  Patient Location:  Home  Provider location:   [place]  PCP:  [facility]  Cardiologist:  Fransico Him, MD Electrophysiologist:  None   Chief Complaint:  Atrial fibrillation and chest pain  History of Present Illness:    Brian Ellison is a 78 y.o. male who presents via audio/video conferencing for a telehealth visit today.    This is a 78yo male with a history of Dm, OSA and hyperlipidemia who was admitted to [facility] 10/04/2018 with complaints of  CP. Chest CT neg for PE but showed mild 3v CAD.  He was dx with LLL PNA by chest CT treated with antibx.  He then developed chills, rigors and fever and developed afib with RVR treated with Lopressor 12.5mg  BID.  Echo showed normal LVF with no signficant valvular heart dz.  There was mild LAE.  TSH was normal.  He was started on Xarelto 20mg  daily for CHADS2VAS score of 3.  He converted back to NSR the next day.     He was seen back 10/2018 and was back in atrial flutter and was referred to afib clinic.  He did not have any further CP which was felt to be related to PNA in hospital. In afib clinic, therapeutic options including Tikosyn, amiodarone, vs rate control. He did not want Tikosyn because his wife had an episode of torsades de pointes while being loaded on the medication. He wanted to take some time to consider amiodarone after discussing risks and benefits of the medication.   He later decided to start on Amio and was loaded on 200mg  BID and converted to NSR at next afib clinic.  His Amio was decreased to 200mg  daily.  Due to ongoing DOE , Nuclear stress test was done which showed no ischemia.    He is here today for followup and is doing well.  He denies any chest pain or pressure, SOB, DOE, PND, orthopnea, LE edema, dizziness, palpitations or syncope. He is compliant with his meds and is tolerating meds with no SE.   He is doing well with his CPAP  device and thinks that he has gotten used to it.  He tolerates the mask and feels the pressure is adequate.  Since going on CPAP he feels rested in the am and has no significant daytime sleepiness.  He denies any significant mouth or nasal dryness or nasal congestion.  He does not think that he snores.     The patient does not have symptoms concerning for COVID-19 infection (fever, chills, cough, or new shortness of breath).    Prior CV studies:   The following studies were reviewed today:  Nuclear stress test  Past Medical History:  Diagnosis  Date  . Allergic rhinitis   . Arthritis    SPINAL AND JOINTS  . Bladder polyp   . BPH (benign prostatic hypertrophy)   . Chronic back pain   . Diverticulosis   . Dyslipidemia    per pt questionable  . ED (erectile dysfunction) of organic origin   . GERD (gastroesophageal reflux disease)   . Glaucoma    BOTH EYES  . History of colon polyps   . History of gastritis   . History of kidney stones    many yrs ago  . 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 History of shingles   . Lower urinary tract symptoms (LUTS)   . OSA on CPAP    very severe per study 01-31-2006  . Peyronie's disease   . S/P insertion of spinal cord stimulator    2010  . Tingling    right arm;takes Gabapentin  . Type 2 diabetes mellitus ([place])    Past Surgical History:  Procedure Laterality Date  . ANTERIOR CERVICAL DECOMP/DISCECTOMY FUSION  06-16-2004   C3 -- C5  . APPENDECTOMY  age 37  . CATARACT EXTRACTION W/ INTRAOCULAR LENS  IMPLANT, BILATERAL    . COLONOSCOPY    . CYSTOSCOPY WITH BIOPSY N/A 07/15/2015   Procedure: CYSTOSCOPY WITH BLADDER AND PROSTATE BIOPSY;  Surgeon: Festus Aloe, MD;  [facility];  Service: Urology;  Laterality: N/A;  . HAND SURGERY Right 2017   "right hand middle finger knuckle replaced"  . INGUINAL HERNIA REPAIR Bilateral 1970's  . NASAL SEPTUM SURGERY  yrs ago  . PENILE PROSTHESIS IMPLANT  11-26-2007  . PERMANANT SPINAL CORD STIMULATOR IMPLANT VIA THORACIC LAMINECTOMY  05-12-2009   Medtronic generator (left buttock)  . POSTERIOR LAMINECTOMY / DECOMPRESSION CERVICAL SPINE  03-08-2005   C3  -- C6  . RETINAL DETACHMENT SURGERY Right   . REVERSE SHOULDER ARTHROPLASTY Left 09/23/2015   Procedure: LEFT REVERSE TOTAL SHOULDER ARTHROPLASTY;  Surgeon: Netta Cedars, MD;  Location: [place];  Service: Orthopedics;  Laterality: Left;  . REVISION TOTAL ARTHROPLASTY RIGHT SHOULDER  07-22-2009  . RIGHT HEEL SURGERY      spurs  . RIGHT SHOULDER HEMIARTHROPLASTY  08-20-2008  . ROTATOR CUFF REPAIR Bilateral x1 right/  x2  left - last one 2013  . SHOULDER OPEN ROTATOR CUFF REPAIR Bilateral   . SPINAL CORD STIMULATOR BATTERY EXCHANGE N/A 07/29/2018   Procedure: Spinal cord stimulator/implantable pulse generator battery change;  Surgeon: Erline Levine, MD;  Location: [place];  Service: Neurosurgery;  Laterality: N/A;  Spinal cord stimulator/implantable pulse generator battery change  . TONSILLECTOMY    . TOTAL KNEE ARTHROPLASTY Right 04/17/2013   Procedure: RIGHT TOTAL KNEE ARTHROPLASTY;  Surgeon: Augustin Schooling, MD;  Location: [place];  Service: Orthopedics;  Laterality: Right;  . TOTAL KNEE ARTHROPLASTY Left  02-10-2009     Current Meds  Medication Sig  . amiodarone (PACERONE) 200 MG tablet Take 1 tablet (200 mg total) by mouth daily.  . ARTIFICIAL TEAR SOLUTION OP Place 1 drop into both eyes daily as needed (dry eyes).  . brimonidine (ALPHAGAN) 0.15 % ophthalmic solution Place 1 drop into both eyes 2 (two) times daily.  . cetirizine (ZYRTEC) 10 MG tablet Take 10 mg by mouth daily.  . fluticasone (FLONASE) 50 MCG/ACT nasal spray Place 1 spray into both nostrils at bedtime.  . furosemide (LASIX) 20 MG tablet Take 20 mg by mouth every other day. 10mg  every other day  . gabapentin (NEURONTIN) 600 MG tablet Take 600 mg by mouth 2 (two) times daily.   Marland Kitchen glipiZIDE (GLUCOTROL) 5 MG tablet Take 2.5 mg by mouth daily before breakfast.  . ibuprofen (ADVIL,MOTRIN) 200 MG tablet Take 600 mg by mouth at bedtime as needed for headache or moderate pain.  . metFORMIN (GLUCOPHAGE) 500 MG tablet Take 1,000 mg by mouth 2 (two) times daily with a meal.   . Metoprolol Succinate 25 MG CS24 Take 25 mg by mouth daily.  . Multiple Vitamins-Minerals (VITAMIN D3 COMPLETE PO) Take 50 mcg by mouth daily.  . pantoprazole (PROTONIX) 40 MG tablet Take 40 mg by mouth daily.  . potassium chloride (K-DUR) 10 MEQ tablet Take 1 tablet (10 mEq  total) by mouth daily.  . prednisoLONE acetate (PRED FORTE) 1 % ophthalmic suspension Place 1 drop into the right eye 4 (four) times daily.  . rivaroxaban (XARELTO) 20 MG TABS tablet Take 1 tablet (20 mg total) by mouth daily with supper for 30 days.  Marland Kitchen rOPINIRole (REQUIP) 1 MG tablet Take 1 mg by mouth at bedtime.    . vitamin B-12 (CYANOCOBALAMIN) 500 MCG tablet Take 750 mcg by mouth daily.      Allergies:   Patient has no known allergies.   Social History   Tobacco Use  . Smoking status: Former Smoker    Packs/day: 1.00    Years: 15.00    Pack years: 15.00    Types: Cigarettes    Last attempt to quit: 07/08/1971    Years since quitting: 47.6  . Smokeless tobacco: Never Used  Substance Use Topics  . Alcohol use: Yes    Comment: rare  . Drug use: No     Family Hx: The patient's family history is not on file.  ROS:   Please see the history of present illness.     All other systems reviewed and are negative.   Labs/Other Tests and Data Reviewed:    Recent Labs: 10/04/2018: ALT 31; Hemoglobin 13.7; Platelets 180; TSH 0.807 11/12/2018: B Natriuretic Peptide 333.0; BUN 11; Creatinine, Ser 0.90; Potassium 3.3; Sodium 138   Recent Lipid Panel Lab Results  Component Value Date/Time   CHOL 157 10/04/2018 01:32 PM   TRIG 52 10/04/2018 01:32 PM   HDL 51 10/04/2018 01:32 PM   CHOLHDL 3.1 10/04/2018 01:32 PM   LDLCALC 96 10/04/2018 01:32 PM    Wt Readings from Last 3 Encounters:  02/09/19 239 lb (108.4 kg)  11/12/18 250 lb (113.4 kg)  10/29/18 250 lb (113.4 kg)     Objective:    Vital Signs:  Ht 6' (1.829 m)   Wt 239 lb (108.4 kg)   BMI 32.41 kg/m     ASSESSMENT & PLAN:    1.  Paroxysmal atrial fibrillation - he thinks that he is in NSR.  He has not had  any palpitations.  He will continue on amiodarone 200 mg daily, Toprol-XL 25 mg daily and Xarelto 20 mg daily.  His creatinine was stable at 0.9 on 11/12/2018. His Hbg was 13.7 on 09/2018.  He has not had any bleeding  problems.  I will check LFTs and TSH in 6 weeks.  2.  Coronary artery calcifications - this was noted on chest CT in February but no ischemia on nuclear stress test.  He has not had any further CP and given the quality of the CP ( pleuritic) in the setting of URI at that time, the CP likely related to PNA at that time.  I have instructed him to call if he has any exertional chest discomfort.  He is not on ASA due to Xarelto.   3.  Hyperlipidemia - LDL goal < 70 due to coronary artery calcifications.  He is currently not on a statin.  His LDL was 96 in January.  I will start Atorvastatin 10mg  daily and repeat FLP and ALT in 6 weeks.   4.  OSA - the patient is tolerating PAP therapy well without any problems.  I will get a download from the DME.  The patient has been using and benefiting from PAP use and will continue to benefit from therapy.   5.  Type 2 DM - followed by his PCP.  His last HbA1C was elevated at 8.1.  He will continue on metformin 1000mg  BID.  6.  COVID-19 Education:The signs and symptoms of COVID-19 were discussed with the patient and how to seek care for testing (follow up with PCP or arrange E-visit).  The importance of social distancing was discussed today.  Patient Risk:   After full review of this patient's clinical status, I feel that they are at least moderate risk at this time.  Time:   Today, I have spent 12 minutes directly with the patient on telephone discussing medical problems including Coronary Ca+, lipids, afib, OSA.  We also reviewed the symptoms of COVID 19 and the ways to protect against contracting the virus with telehealth technology.  I spent an additional 5 minutes reviewing patient's chart including nuclear stress test and labs.  Medication Adjustments/Labs and Tests Ordered: Current medicines are reviewed at length with the patient today.  Concerns regarding medicines are outlined above.  Tests Ordered: No orders of the defined types were placed in this  encounter.  Medication Changes: No orders of the defined types were placed in this encounter.   Disposition:  Follow up 6 months  Signed, Fransico Him, MD  02/09/2019 9:17 AM    [place]

## 2019-02-09 ENCOUNTER — Ambulatory Visit: Payer: Medicare Other | Admitting: Cardiology

## 2019-02-09 ENCOUNTER — Telehealth (INDEPENDENT_AMBULATORY_CARE_PROVIDER_SITE_OTHER): Payer: Medicare Other | Admitting: Cardiology

## 2019-02-09 ENCOUNTER — Encounter: Payer: Self-pay | Admitting: Cardiology

## 2019-02-09 ENCOUNTER — Other Ambulatory Visit: Payer: Self-pay

## 2019-02-09 VITALS — Ht 72.0 in | Wt 239.0 lb

## 2019-02-09 DIAGNOSIS — E785 Hyperlipidemia, unspecified: Secondary | ICD-10-CM | POA: Diagnosis not present

## 2019-02-09 DIAGNOSIS — I2510 Atherosclerotic heart disease of native coronary artery without angina pectoris: Secondary | ICD-10-CM | POA: Diagnosis not present

## 2019-02-09 DIAGNOSIS — E119 Type 2 diabetes mellitus without complications: Secondary | ICD-10-CM | POA: Insufficient documentation

## 2019-02-09 DIAGNOSIS — N181 Chronic kidney disease, stage 1: Secondary | ICD-10-CM | POA: Diagnosis not present

## 2019-02-09 DIAGNOSIS — I480 Paroxysmal atrial fibrillation: Secondary | ICD-10-CM

## 2019-02-09 DIAGNOSIS — E1122 Type 2 diabetes mellitus with diabetic chronic kidney disease: Secondary | ICD-10-CM

## 2019-02-09 DIAGNOSIS — G4733 Obstructive sleep apnea (adult) (pediatric): Secondary | ICD-10-CM

## 2019-02-09 MED ORDER — ATORVASTATIN CALCIUM 10 MG PO TABS: 10.0000 mg | ORAL_TABLET | Freq: Every day | ORAL | 3 refills | Status: DC

## 2019-02-09 NOTE — Patient Instructions (Signed)
Medication Instructions:  Start: Lipitor 10 mg, daily, by mouth  If you need a refill on your cardiac medications before your next appointment, please call your pharmacy.   Lab work:  Fasting Labs: Lipid, Liver and TSH in 6 weeks, call the office to schedule.  If you have labs (blood work) drawn today and your tests are completely normal, you will receive your results only by: Marland Kitchen MyChart Message (if you have MyChart) OR . A paper copy in the mail If you have any lab test that is abnormal or we need to change your treatment, we will call you to review the results.  Testing/Procedures: None   Follow-Up: At [facility], you and your health needs are our priority.  As part of our continuing mission to provide you with exceptional heart care, we have created designated Provider Care Teams.  These Care Teams include your primary Cardiologist (physician) and Advanced Practice Providers (APPs -  Physician Assistants and Nurse Practitioners) who all work together to provide you with the care you need, when you need it. You will need a follow up appointment in 6 months.  Please call our office 2 months in advance to schedule this appointment.  You may see Dr. Radford Pax or one of the following Advanced Practice Providers on your designated Care Team:   Lydia, PA-C Melina Copa, PA-C . Ermalinda Barrios, PA-C

## 2019-02-10 ENCOUNTER — Encounter: Payer: Self-pay | Admitting: Internal Medicine

## 2019-02-10 ENCOUNTER — Telehealth: Payer: Self-pay | Admitting: *Deleted

## 2019-02-10 NOTE — Telephone Encounter (Signed)
[facility] is working on getting a d/l for me.

## 2019-02-10 NOTE — Telephone Encounter (Signed)
-----   Message from Sarina Ill, RN sent at 02/09/2019 10:42 AM EDT ----- The patient needs fasting labs scheduled in 6 weeks.   Nina: get D/L from DME  Thanks, Suezanne Jacquet ----- Message ----- From: Sueanne Margarita, MD Sent: 02/09/2019   9:15 AM EDT To: Sarina Ill, RN  Lipitor 10mg  daily and FLP and ALT in 6 weeks.  TSH and LFTs in 6 weeks. Get a download from the DME.  Followup with me in 6 months

## 2019-02-20 ENCOUNTER — Telehealth: Payer: Self-pay | Admitting: *Deleted

## 2019-02-20 NOTE — Telephone Encounter (Signed)
Patient is aware and agreeable to AHI being within range at 2.3. Patient is aware and agreeable to being in compliance with machine usage. Patient is aware and agreeable to no change in current pressures.

## 2019-03-03 ENCOUNTER — Telehealth (HOSPITAL_COMMUNITY): Payer: Self-pay | Admitting: *Deleted

## 2019-03-03 DIAGNOSIS — Z1283 Encounter for screening for malignant neoplasm of skin: Secondary | ICD-10-CM | POA: Diagnosis not present

## 2019-03-03 DIAGNOSIS — X32XXXD Exposure to sunlight, subsequent encounter: Secondary | ICD-10-CM | POA: Diagnosis not present

## 2019-03-03 DIAGNOSIS — L821 Other seborrheic keratosis: Secondary | ICD-10-CM | POA: Diagnosis not present

## 2019-03-03 DIAGNOSIS — L820 Inflamed seborrheic keratosis: Secondary | ICD-10-CM | POA: Diagnosis not present

## 2019-03-03 DIAGNOSIS — L570 Actinic keratosis: Secondary | ICD-10-CM | POA: Diagnosis not present

## 2019-03-03 NOTE — Telephone Encounter (Signed)
Patient was actually out of xarelto his wife is on eliquis and wanted to short term take eliquis until his xarelto comes in. Offered pt samples but pt refused.

## 2019-03-03 NOTE — Telephone Encounter (Signed)
Pt OK to switch to Eliquis 5 mg BID. Take first dose of Eliquis at the same time he would have taken the Xarelto, then BID thereafter.

## 2019-03-03 NOTE — Telephone Encounter (Signed)
Patient would like to know if he can switch to eliquis due to costs. Please advise.

## 2019-03-24 ENCOUNTER — Other Ambulatory Visit: Payer: Self-pay

## 2019-03-24 ENCOUNTER — Other Ambulatory Visit: Payer: Medicare Other

## 2019-03-24 DIAGNOSIS — E785 Hyperlipidemia, unspecified: Secondary | ICD-10-CM | POA: Diagnosis not present

## 2019-03-24 DIAGNOSIS — I480 Paroxysmal atrial fibrillation: Secondary | ICD-10-CM

## 2019-03-24 LAB — LIPID PANEL
Chol/HDL Ratio: 2.4 ratio (ref 0.0–5.0)
Cholesterol, Total: 120 mg/dL (ref 100–199)
HDL: 49 mg/dL (ref >39)
LDL Calculated: 46 mg/dL (ref 0–99)
Triglycerides: 125 mg/dL (ref 0–149)
VLDL Cholesterol Cal: 25 mg/dL (ref 5–40)

## 2019-03-24 LAB — HEPATIC FUNCTION PANEL
ALT: 17 IU/L (ref 0–44)
AST: 14 IU/L (ref 0–40)
Albumin: 4.4 g/dL (ref 3.7–4.7)
Alkaline Phosphatase: 58 IU/L (ref 39–117)
Bilirubin Total: 0.4 mg/dL (ref 0.0–1.2)
Bilirubin, Direct: 0.16 mg/dL (ref 0.00–0.40)
Total Protein: 5.9 g/dL — ABNORMAL LOW (ref 6.0–8.5)

## 2019-03-24 LAB — TSH: TSH: 2.51 u[IU]/mL (ref 0.450–4.500)

## 2019-04-03 ENCOUNTER — Other Ambulatory Visit: Payer: Self-pay

## 2019-04-03 ENCOUNTER — Ambulatory Visit (INDEPENDENT_AMBULATORY_CARE_PROVIDER_SITE_OTHER): Payer: Medicare Other | Admitting: Internal Medicine

## 2019-04-03 ENCOUNTER — Encounter: Payer: Self-pay | Admitting: Internal Medicine

## 2019-04-03 VITALS — BP 116/80 | HR 66 | Temp 98.6°F | Ht 72.0 in | Wt 250.0 lb

## 2019-04-03 DIAGNOSIS — J181 Lobar pneumonia, unspecified organism: Secondary | ICD-10-CM

## 2019-04-03 DIAGNOSIS — I2510 Atherosclerotic heart disease of native coronary artery without angina pectoris: Secondary | ICD-10-CM | POA: Diagnosis not present

## 2019-04-03 DIAGNOSIS — G2581 Restless legs syndrome: Secondary | ICD-10-CM

## 2019-04-03 DIAGNOSIS — G4733 Obstructive sleep apnea (adult) (pediatric): Secondary | ICD-10-CM | POA: Diagnosis not present

## 2019-04-03 DIAGNOSIS — J189 Pneumonia, unspecified organism: Secondary | ICD-10-CM

## 2019-04-03 NOTE — Patient Instructions (Signed)
Order- DME APS   Please replace old CPAP machine, auto 5-20, mask of choice, humidifier, supplies, AirView/ card  Please call if we can  help

## 2019-04-03 NOTE — Assessment & Plan Note (Signed)
Not indicating a significant sleep problem now.

## 2019-04-03 NOTE — Assessment & Plan Note (Signed)
Identified Jan, 2020 on presentation with AFib and dyspnea. Clinically resolved. CXR 10/04/2018 noted small effusions and vascular congestion.

## 2019-04-03 NOTE — Progress Notes (Signed)
Subjective:    Patient ID: Brian Mosses Sr., male    DOB: 1941-05-13, 78 y.o.   MRN: [id number]  HPI male former smoker followed for OSA, restless legs, complicated by obesity, HBP, allergic rhinitis, GERD, glaucoma, DM 2 NPSG 01/11/06- AHI 104/ hr, desaturation to 68%  ------------------------------------------------------------------------------------------------------------  12/24/17- 78 year old male former smoker followed for OSA, restless legs, complicated by obesity, HBP, allergic rhinitis, GERD, glaucoma, DM 2 CPAP auto 5-20/  APS ----OSA: DME APS. DL attached and pt wearing CPAP. Will need order for new supplies.  Download 83% compliance AHI 1.6/hour.  When he goes out of town he uses a Proofreader at [employer], explaining gaps.  He feels he is doing very well and that pressures are comfortable.  Sleeps better with CPAP.  Asks about [employer] machine-discussed. No other recent significant medical events or concerns.  04/03/2019- 78 year old male former smoker followed for OSA, restless legs, complicated by obesity, HBP, allergic rhinitis, GERD, Glaucoma, DM 2, PAF, CAD, Hyperlipidemia CPAP auto 5-20/  APS Download- none available today. Last available from May showed 90% compliance, AHI 2.3/ hr. -----OSA on CPAP 5-20, DME: APS; no complaints, pt states he may be due for a new machine Body weight today- 250 lbs Hosp in Jan w LLL PNA, AFib>RSR now amiodarone, Xarelto. Describes smothering and chest pain with that illness. Breathing now baseline w/o cough/ wheeze.. Comfortable with CPAP, nasal mask. Discussed Covid signs and precautions- he is careful. CXR 11/12/2018 IMPRESSION: Small bilateral pleural effusions and overlying atelectasis. Mild vascular congestion without overt pulmonary edema.  ROS-see HPI   + = positive Constitutional:    weight loss, night sweats, fevers, chills, fatigue, lassitude. HEENT:    headaches, difficulty swallowing, tooth/dental problems, sore  throat,       sneezing, itching, ear ache, nasal congestion, post nasal drip, snoring CV:    chest pain, orthopnea, PND, swelling in lower extremities, anasarca,                                  dizziness, palpitations Resp:   shortness of breath with exertion or at rest.                productive cough,   non-productive cough, coughing up of blood.              change in color of mucus.  wheezing.   Skin:    rash or lesions. GI:  No-   heartburn, indigestion, abdominal pain, nausea, vomiting, diarrhea,                 change in bowel habits, loss of appetite GU: dysuria, change in color of urine, no urgency or frequency.   flank pain. MS:   joint pain, stiffness, decreased range of motion, back pain. Neuro-     nothing unusual Psych:  change in mood or affect.  depression or anxiety.   memory loss.   Objective:  OBJ- Physical Exam General- Alert, Oriented, Affect-appropriate, Distress- none acute, +obese Skin- + scabs R forearm Lymphadenopathy- none Head- atraumatic            Eyes- Gross vision intact, PERRLA, conjunctivae and secretions clear            Ears- Hearing, canals-normal            Nose- clear, no-Septal dev, mucus, polyps, erosion, perforation  Throat- Mallampati III , mucosa clear , drainage- none, tonsils- atrophic Neck- flexible , trachea midline, no stridor , thyroid nl, carotid no bruit Chest - symmetrical excursion , unlabored           Heart/CV- RRR , no murmur , no gallop  , no rub, nl s1 s2                           - JVD- none , edema- none, stasis changes- none, varices- none           Lung- clear to P&A, wheeze- none, cough- none , dullness-none, rub- none           Chest wall-  Abd-  Br/ Gen/ Rectal- Not done, not indicated Extrem- cyanosis- none, clubbing, none, atrophy- none, strength- nl Neuro- grossly intact to observation         Assessment & Plan:

## 2019-04-03 NOTE — Assessment & Plan Note (Signed)
He has continued to benefit from CPAP, reporting good compliance. Interaction between OSA and AFib reviewed.   Plan - replace old CPAP auto 5-20,

## 2019-05-16 DIAGNOSIS — Z23 Encounter for immunization: Secondary | ICD-10-CM | POA: Diagnosis not present

## 2019-05-29 ENCOUNTER — Telehealth: Payer: Self-pay

## 2019-05-29 DIAGNOSIS — Z79899 Other long term (current) drug therapy: Secondary | ICD-10-CM

## 2019-05-29 MED ORDER — FUROSEMIDE 20 MG PO TABS: 20.0000 mg | ORAL_TABLET | ORAL | 3 refills | Status: DC

## 2019-05-29 NOTE — Telephone Encounter (Signed)
I spoke to the patient in regards to Dr Theodosia Blender recommendation.  He verbalized understanding.

## 2019-06-01 ENCOUNTER — Other Ambulatory Visit: Payer: Medicare Other

## 2019-06-02 ENCOUNTER — Telehealth: Payer: Self-pay

## 2019-06-02 ENCOUNTER — Other Ambulatory Visit: Payer: Medicare Other

## 2019-06-02 DIAGNOSIS — Z79899 Other long term (current) drug therapy: Secondary | ICD-10-CM | POA: Diagnosis not present

## 2019-06-02 LAB — BASIC METABOLIC PANEL WITH GFR
BUN/Creatinine Ratio: 17 (ref 10–24)
BUN: 16 mg/dL (ref 8–27)
CO2: 22 mmol/L (ref 20–29)
Calcium: 9 mg/dL (ref 8.6–10.2)
Chloride: 104 mmol/L (ref 96–106)
Creatinine, Ser: 0.96 mg/dL (ref 0.76–1.27)
GFR calc Af Amer: 86 mL/min/1.73
GFR calc non Af Amer: 74 mL/min/1.73
Glucose: 211 mg/dL — ABNORMAL HIGH (ref 65–99)
Potassium: 4.5 mmol/L (ref 3.5–5.2)
Sodium: 141 mmol/L (ref 134–144)

## 2019-06-02 NOTE — Telephone Encounter (Signed)
Notes recorded by Frederik Schmidt, RN on 06/02/2019 at 4:52 PM EDT  Lpm with results 9/22  ------

## 2019-06-02 NOTE — Telephone Encounter (Signed)
-----   Message from Sueanne Margarita, MD sent at 06/02/2019  4:41 PM EDT ----- Please let patient know that labs were normal except for elevated BS - forward to PCP.  Continue current medical therapy.

## 2019-06-03 ENCOUNTER — Other Ambulatory Visit: Payer: Self-pay

## 2019-06-03 DIAGNOSIS — I480 Paroxysmal atrial fibrillation: Secondary | ICD-10-CM

## 2019-06-03 DIAGNOSIS — Z79899 Other long term (current) drug therapy: Secondary | ICD-10-CM

## 2019-06-03 DIAGNOSIS — I2510 Atherosclerotic heart disease of native coronary artery without angina pectoris: Secondary | ICD-10-CM

## 2019-06-10 ENCOUNTER — Other Ambulatory Visit: Payer: Medicare Other

## 2019-06-30 DIAGNOSIS — M5136 Other intervertebral disc degeneration, lumbar region: Secondary | ICD-10-CM | POA: Diagnosis not present

## 2019-08-11 ENCOUNTER — Telehealth: Payer: Self-pay

## 2019-08-11 MED ORDER — AMIODARONE HCL 200 MG PO TABS: 200.0000 mg | ORAL_TABLET | Freq: Every day | ORAL | 3 refills | Status: DC

## 2019-08-11 NOTE — Telephone Encounter (Signed)
Called patient to switch from in office visit to virtual visit. Patient was okay with switch and requested refill for Amiodarone. Sent in refill for patient.

## 2019-08-12 ENCOUNTER — Telehealth (INDEPENDENT_AMBULATORY_CARE_PROVIDER_SITE_OTHER): Payer: Medicare Other | Admitting: Cardiology

## 2019-08-12 ENCOUNTER — Encounter: Payer: Self-pay | Admitting: Cardiology

## 2019-08-12 ENCOUNTER — Other Ambulatory Visit: Payer: Self-pay

## 2019-08-12 VITALS — BP 126/71 | HR 60 | Ht 72.0 in | Wt 250.0 lb

## 2019-08-12 DIAGNOSIS — E7800 Pure hypercholesterolemia, unspecified: Secondary | ICD-10-CM

## 2019-08-12 DIAGNOSIS — E119 Type 2 diabetes mellitus without complications: Secondary | ICD-10-CM

## 2019-08-12 DIAGNOSIS — G4733 Obstructive sleep apnea (adult) (pediatric): Secondary | ICD-10-CM | POA: Diagnosis not present

## 2019-08-12 DIAGNOSIS — I480 Paroxysmal atrial fibrillation: Secondary | ICD-10-CM

## 2019-08-12 DIAGNOSIS — I2510 Atherosclerotic heart disease of native coronary artery without angina pectoris: Secondary | ICD-10-CM | POA: Diagnosis not present

## 2019-08-12 NOTE — Patient Instructions (Addendum)
Medication Instructions:  Your physician recommends that you continue on your current medications as directed. Please refer to the Current Medication list given to you today.  *If you need a refill on your cardiac medications before your next appointment, please call your pharmacy*  Lab Work: You will need to have a CBC drawn.   If you have labs (blood work) drawn today and your tests are completely normal, you will receive your results only by: Marland Kitchen MyChart Message (if you have MyChart) OR . A paper copy in the mail If you have any lab test that is abnormal or we need to change your treatment, we will call you to review the results.  Testing/Procedures: Your physician has recommended that you have a pulmonary function test. Pulmonary Function Tests are a group of tests that measure how well air moves in and out of your lungs.  Follow-Up: At [facility], you and your health needs are our priority.  As part of our continuing mission to provide you with exceptional heart care, we have created designated Provider Care Teams.  These Care Teams include your primary Cardiologist (physician) and Advanced Practice Providers (APPs -  Physician Assistants and Nurse Practitioners) who all work together to provide you with the care you need, when you need it.  Your next appointment:   6 month(s)  The format for your next appointment:   Either In Person or Virtual  Provider:   Fransico Him, MD   Patient Instructions for Pulmonary Function Test  1. Do not smoke within at least 1 hour before the test. 2. Do not consume Caffeine 4 hours prior to the test. 3. Do not consume Alcohol within 4 hours prior to testing. 4. Do not perform any Vigorous Exercise within 30 minutes before the test. 5. Do not wear clothes that restrict the chest area or abdomen. 6. Do not use albuterol or Xopenex 3 hours before the test or any other nebulizer medications or inhalers.

## 2019-08-12 NOTE — Addendum Note (Signed)
Addended by: Antonieta Iba on: 08/12/2019 11:34 AM   Modules accepted: Orders

## 2019-08-12 NOTE — Progress Notes (Signed)
Virtual Visit via Telephone Note   This visit type was conducted due to national recommendations for restrictions regarding the COVID-19 Pandemic (e.g. social distancing) in an effort to limit this patient's exposure and mitigate transmission in our community.  Due to his co-morbid illnesses, this patient is at least at moderate risk for complications without adequate follow up.  This format is felt to be most appropriate for this patient at this time.  The patient did not have access to video technology/had technical difficulties with video requiring transitioning to audio format only (telephone).  All issues noted in this document were discussed and addressed.  No physical exam could be performed with this format.  Please refer to the patient's chart for his  consent to telehealth for [facility] [place].  Evaluation Performed:  Follow-up visit  This visit type was conducted due to national recommendations for restrictions regarding the COVID-19 Pandemic (e.g. social distancing).  This format is felt to be most appropriate for this patient at this time.  All issues noted in this document were discussed and addressed.  No physical exam was performed (except for noted visual exam findings with Video Visits).  Please refer to the patient's chart (MyChart message for video visits and phone note for telephone visits) for the patient's consent to telehealth for [facility].  Date:  08/12/2019   ID:  Brian Ellison, DOB [birth year], MRN [id number]  Patient Location:  Home  Provider location:   Home  PCP:  [facility]  Cardiologist:  Fransico Him, MD  Electrophysiologist:  None   Chief Complaint:  [name]  History of Present Illness:    HALIL SLIMP is a 78 y.o. male who presents via audio/video conferencing for a telehealth visit today.    This is a 78yo male with a history of Dm, OSA and hyperlipidemia who was [facility] to [facility]/04/2018 with complaints of CP. Chest CT neg for PE  but showed mild 3v CAD. He was dx with LLL PNA by chest CT treated with antibx. He then developed chills, rigors and fever and developed afib with RVR treated with Lopressor 12.5mg  BID. Echo showed normal LVF with no signficant valvular heart dz. There was mild LAE. TSH was normal. He was started on Xarelto 20mg  daily for CHADS2VAS score of 3. He converted back to NSR the next day.   He was seen back 10/2018 and was back in atrial flutter and was referred to afib clinic.  He did not have any further CP which was felt to be related to PNA in hospital. In afib clinic, therapeutic options including Tikosyn, amiodarone, vs rate control. He did not want Tikosyn because his wife had an episode of torsades de pointes while being loaded on the medication. He wanted to take some time to consider amiodarone after discussing risks and benefits of the medication.   He later decided to start on Amio and was loaded on 200mg  BID and converted to NSR at next afib clinic.  His Amio was decreased to 200mg  daily.  Due to ongoing DOE , Nuclear stress test was done which showed no ischemia.    The patient does not have symptoms concerning for COVID-19 infection (fever, chills, cough, or new shortness of breath).    Prior CV studies:   The following studies were reviewed today:  none  Past Medical History:  Diagnosis Date  . Allergic rhinitis   . Arthritis    SPINAL AND JOINTS  . Bladder polyp   .  BPH (benign prostatic hypertrophy)   . Chronic back pain   . Diverticulosis   . Dyslipidemia    per pt questionable  . ED (erectile dysfunction) of organic origin   . GERD (gastroesophageal reflux disease)   . Glaucoma    BOTH EYES  . History of colon polyps   . History of gastritis   . History of kidney stones    many yrs ago  . 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 History of shingles   . Lower urinary tract symptoms (LUTS)   . OSA on CPAP     very severe per study 01-31-2006  . Peyronie's disease   . S/P insertion of spinal cord stimulator    2010  . Tingling    right arm;takes Gabapentin  . Type 2 diabetes mellitus ([place])    Past Surgical History:  Procedure Laterality Date  . ANTERIOR CERVICAL DECOMP/DISCECTOMY FUSION  06-16-2004   C3 -- C5  . APPENDECTOMY  age 71  . CATARACT EXTRACTION W/ INTRAOCULAR LENS  IMPLANT, BILATERAL    . COLONOSCOPY    . CYSTOSCOPY WITH BIOPSY N/A 07/15/2015   Procedure: CYSTOSCOPY WITH BLADDER AND PROSTATE BIOPSY;  Surgeon: Festus Aloe, MD;  Location: [facility];  Service: Urology;  Laterality: N/A;  . HAND SURGERY Right 2017   "right hand middle finger knuckle replaced"  . INGUINAL HERNIA REPAIR Bilateral 1970's  . NASAL SEPTUM SURGERY  yrs ago  . PENILE PROSTHESIS IMPLANT  11-26-2007  . PERMANANT SPINAL CORD STIMULATOR IMPLANT VIA THORACIC LAMINECTOMY  05-12-2009   Medtronic generator (left buttock)  . POSTERIOR LAMINECTOMY / DECOMPRESSION CERVICAL SPINE  03-08-2005   C3  -- C6  . RETINAL DETACHMENT SURGERY Right   . REVERSE SHOULDER ARTHROPLASTY Left 09/23/2015   Procedure: LEFT REVERSE TOTAL SHOULDER ARTHROPLASTY;  Surgeon: Netta Cedars, MD;  Location: [place];  Service: Orthopedics;  Laterality: Left;  . REVISION TOTAL ARTHROPLASTY RIGHT SHOULDER  07-22-2009  . RIGHT HEEL SURGERY     spurs  . RIGHT SHOULDER HEMIARTHROPLASTY  08-20-2008  . ROTATOR CUFF REPAIR Bilateral x1 right/  x2  left - last one 2013  . SHOULDER OPEN ROTATOR CUFF REPAIR Bilateral   . SPINAL CORD STIMULATOR BATTERY EXCHANGE N/A 07/29/2018   Procedure: Spinal cord stimulator/implantable pulse generator battery change;  Surgeon: Erline Levine, MD;  Location: [place];  Service: Neurosurgery;  Laterality: N/A;  Spinal cord stimulator/implantable pulse generator battery change  . TONSILLECTOMY    . TOTAL KNEE ARTHROPLASTY Right 04/17/2013   Procedure: RIGHT TOTAL KNEE ARTHROPLASTY;  Surgeon: Augustin Schooling, MD;  Location: [facility] [place];  Service: Orthopedics;  Laterality: Right;  . TOTAL KNEE ARTHROPLASTY Left 02-10-2009     Current Meds  Medication Sig  . amiodarone (PACERONE) 200 MG tablet Take 1 tablet (200 mg total) by mouth daily.  . ARTIFICIAL TEAR SOLUTION OP Place 1 drop into both eyes daily as needed (dry eyes).  Marland Kitchen atorvastatin (LIPITOR) 10 MG tablet Take 1 tablet (10 mg total) by mouth daily.  . brimonidine (ALPHAGAN) 0.15 % ophthalmic solution Place 1 drop into both eyes 2 (two) times daily.  . cetirizine (ZYRTEC) 10 MG tablet Take 10 mg by mouth daily.  . fluticasone (FLONASE) 50 MCG/ACT nasal spray Place 1 spray into both nostrils at bedtime.  . furosemide (LASIX) 20 MG tablet Take 1 tablet (20 mg total) by mouth every other day. 10mg  every  other day  . metFORMIN (GLUCOPHAGE) 500 MG tablet Take 1,000 mg by mouth 2 (two) times daily with a meal.   . Metoprolol Succinate 25 MG CS24 Take 25 mg by mouth daily.  . pantoprazole (PROTONIX) 40 MG tablet Take 40 mg by mouth daily.  . potassium chloride (K-DUR) 10 MEQ tablet Take 1 tablet (10 mEq total) by mouth daily.  . rivaroxaban (XARELTO) 20 MG TABS tablet Take 1 tablet (20 mg total) by mouth daily with supper for 30 days.  Marland Kitchen rOPINIRole (REQUIP) 1 MG tablet Take 1 mg by mouth at bedtime.    . vitamin B-12 (CYANOCOBALAMIN) 500 MCG tablet Take 750 mcg by mouth daily.      Allergies:   Patient has no known allergies.   Social History   Tobacco Use  . Smoking status: Former Smoker    Packs/day: 1.00    Years: 15.00    Pack years: 15.00    Types: Cigarettes    Quit date: 07/08/1971    Years since quitting: 48.1  . Smokeless tobacco: Never Used  Substance Use Topics  . Alcohol use: Yes    Comment: rare  . Drug use: No     Family Hx: The patient's family history is not on file.  ROS:   Please see the history of present illness.     All other systems reviewed and are negative.   Labs/Other Tests and Data Reviewed:     Recent Labs: 10/04/2018: Hemoglobin 13.7; Platelets 180 11/12/2018: B Natriuretic Peptide 333.0 03/24/2019: ALT 17; TSH 2.510 06/02/2019: BUN 16; Creatinine, Ser 0.96; Potassium 4.5; Sodium 141   Recent Lipid Panel Lab Results  Component Value Date/Time   CHOL 120 03/24/2019 09:18 AM   TRIG 125 03/24/2019 09:18 AM   HDL 49 03/24/2019 09:18 AM   CHOLHDL 2.4 03/24/2019 09:18 AM   CHOLHDL 3.1 10/04/2018 01:32 PM   LDLCALC 46 03/24/2019 09:18 AM    Wt Readings from Last 3 Encounters:  08/12/19 250 lb (113.4 kg)  04/03/19 250 lb (113.4 kg)  02/09/19 239 lb (108.4 kg)     Objective:    Vital Signs:  BP 126/71   Pulse 60   Ht 6' (1.829 m)   Wt 250 lb (113.4 kg)   BMI 33.91 kg/m     ASSESSMENT & PLAN:    1.  Paroxysmal atrial fibrillation  -denies any palpitations and thinks he has been in NSR -continue Amio 200mg  daily, Toprol XL 25mg  daily and Xarelto 20mg  daily -he has not had any bleeding problems on DOAC -creatinine was stable at 0.96 in Sept 2020 -check Hbg  -TSH and ALT normal in July -check PFTs with DLCO for Amio  2.  Coronary artery calcifications  -no ischemia on nuclear stress test 11/2018  -denies any CP -no ASA due to DOAC  3.  Hyperlipidemia  -LDL goal < 70 due to coronary artery calcifications.   -LDL was 46 in July -continue Atorvastatin 10mg  daily  4.  OSA  - The patient is tolerating PAP therapy well without any problems. The PAP download was reviewed today and showed an AHI of 2.3/hr on autoPAP with 90% compliance in using more than 4 hours nightly.  The patient has been using and benefiting from PAP use and will continue to benefit from therapy.   5.  Type 2 DM  - followed by his PCP.  - His last HbA1C was elevated at 13.7% in January 2020 - He will continue on metformin 1000mg   BID.  COVID-19 Education: The signs and symptoms of COVID-19 were discussed with the patient and how to seek care for testing (follow up with PCP or arrange E-visit).   The importance of social distancing was discussed today.  Patient Risk:   After full review of this patient's clinical status, I feel that they are at least moderate risk at this time.  Time:   Today, I have spent 20 minutes directly with the patient on telemedicine discussing medical problems including coronary artery calcifications, HLD, PAF, OSA.  We also reviewed the symptoms of COVID 19 and the ways to protect against contracting the virus with telehealth technology.  I spent an additional 5 minutes reviewing patient's chart including labs.  Medication Adjustments/Labs and Tests Ordered: Current medicines are reviewed at length with the patient today.  Concerns regarding medicines are outlined above.  Tests Ordered: No orders of the defined types were placed in this encounter.  Medication Changes: No orders of the defined types were placed in this encounter.   Disposition:  Follow up in 6 month(s)  Signed, Fransico Him, MD  08/12/2019 10:58 AM    [facility]

## 2019-08-13 ENCOUNTER — Other Ambulatory Visit (HOSPITAL_COMMUNITY): Payer: Self-pay | Admitting: Physician Assistant

## 2019-08-13 MED ORDER — AMIODARONE HCL 200 MG PO TABS: 200.0000 mg | ORAL_TABLET | Freq: Every day | ORAL | 3 refills | Status: DC

## 2019-08-13 NOTE — Addendum Note (Signed)
Addended by: Derl Barrow on: 08/13/2019 12:03 PM   Modules accepted: Orders

## 2019-08-20 ENCOUNTER — Other Ambulatory Visit: Payer: Self-pay

## 2019-08-20 ENCOUNTER — Other Ambulatory Visit: Payer: Medicare Other | Admitting: *Deleted

## 2019-08-20 DIAGNOSIS — G4733 Obstructive sleep apnea (adult) (pediatric): Secondary | ICD-10-CM

## 2019-08-20 DIAGNOSIS — E119 Type 2 diabetes mellitus without complications: Secondary | ICD-10-CM | POA: Diagnosis not present

## 2019-08-20 DIAGNOSIS — I480 Paroxysmal atrial fibrillation: Secondary | ICD-10-CM | POA: Diagnosis not present

## 2019-08-20 DIAGNOSIS — E7800 Pure hypercholesterolemia, unspecified: Secondary | ICD-10-CM

## 2019-08-20 DIAGNOSIS — I2510 Atherosclerotic heart disease of native coronary artery without angina pectoris: Secondary | ICD-10-CM

## 2019-08-21 LAB — CBC
Hematocrit: 41.5 % (ref 37.5–51.0)
Hemoglobin: 14 g/dL (ref 13.0–17.7)
MCH: 28.9 pg (ref 26.6–33.0)
MCHC: 33.7 g/dL (ref 31.5–35.7)
MCV: 86 fL (ref 79–97)
Platelets: 244 10*3/uL (ref 150–450)
RBC: 4.85 x10E6/uL (ref 4.14–5.80)
RDW: 13.7 % (ref 11.6–15.4)
WBC: 8.4 10*3/uL (ref 3.4–10.8)

## 2019-09-15 ENCOUNTER — Other Ambulatory Visit: Payer: Self-pay

## 2019-09-15 ENCOUNTER — Emergency Department (HOSPITAL_COMMUNITY): Payer: No Typology Code available for payment source

## 2019-09-15 ENCOUNTER — Inpatient Hospital Stay (HOSPITAL_COMMUNITY)
Admission: EM | Admit: 2019-09-15 | Discharge: 2019-09-17 | DRG: 605 | Disposition: A | Discharge status: Home / Self Care | Payer: No Typology Code available for payment source | Attending: Internal Medicine | Admitting: Internal Medicine

## 2019-09-15 ENCOUNTER — Encounter (HOSPITAL_COMMUNITY): Payer: Self-pay

## 2019-09-15 ENCOUNTER — Emergency Department (HOSPITAL_BASED_OUTPATIENT_CLINIC_OR_DEPARTMENT_OTHER): Payer: No Typology Code available for payment source

## 2019-09-15 DIAGNOSIS — E119 Type 2 diabetes mellitus without complications: Secondary | ICD-10-CM

## 2019-09-15 DIAGNOSIS — E1151 Type 2 diabetes mellitus with diabetic peripheral angiopathy without gangrene: Secondary | ICD-10-CM | POA: Diagnosis present

## 2019-09-15 DIAGNOSIS — M1990 Unspecified osteoarthritis, unspecified site: Secondary | ICD-10-CM | POA: Diagnosis present

## 2019-09-15 DIAGNOSIS — Z9841 Cataract extraction status, right eye: Secondary | ICD-10-CM

## 2019-09-15 DIAGNOSIS — K219 Gastro-esophageal reflux disease without esophagitis: Secondary | ICD-10-CM | POA: Diagnosis present

## 2019-09-15 DIAGNOSIS — N400 Enlarged prostate without lower urinary tract symptoms: Secondary | ICD-10-CM | POA: Diagnosis present

## 2019-09-15 DIAGNOSIS — I480 Paroxysmal atrial fibrillation: Secondary | ICD-10-CM | POA: Diagnosis not present

## 2019-09-15 DIAGNOSIS — Z96653 Presence of artificial knee joint, bilateral: Secondary | ICD-10-CM | POA: Diagnosis present

## 2019-09-15 DIAGNOSIS — Z8719 Personal history of other diseases of the digestive system: Secondary | ICD-10-CM

## 2019-09-15 DIAGNOSIS — M79609 Pain in unspecified limb: Secondary | ICD-10-CM

## 2019-09-15 DIAGNOSIS — Z87891 Personal history of nicotine dependence: Secondary | ICD-10-CM

## 2019-09-15 DIAGNOSIS — G4733 Obstructive sleep apnea (adult) (pediatric): Secondary | ICD-10-CM | POA: Diagnosis present

## 2019-09-15 DIAGNOSIS — E1140 Type 2 diabetes mellitus with diabetic neuropathy, unspecified: Secondary | ICD-10-CM | POA: Diagnosis present

## 2019-09-15 DIAGNOSIS — M7989 Other specified soft tissue disorders: Secondary | ICD-10-CM | POA: Diagnosis not present

## 2019-09-15 DIAGNOSIS — K2970 Gastritis, unspecified, without bleeding: Secondary | ICD-10-CM | POA: Diagnosis present

## 2019-09-15 DIAGNOSIS — H409 Unspecified glaucoma: Secondary | ICD-10-CM | POA: Diagnosis present

## 2019-09-15 DIAGNOSIS — I10 Essential (primary) hypertension: Secondary | ICD-10-CM | POA: Diagnosis not present

## 2019-09-15 DIAGNOSIS — S8011XA Contusion of right lower leg, initial encounter: Secondary | ICD-10-CM | POA: Diagnosis not present

## 2019-09-15 DIAGNOSIS — E1122 Type 2 diabetes mellitus with diabetic chronic kidney disease: Secondary | ICD-10-CM

## 2019-09-15 DIAGNOSIS — Z7901 Long term (current) use of anticoagulants: Secondary | ICD-10-CM | POA: Diagnosis not present

## 2019-09-15 DIAGNOSIS — Z961 Presence of intraocular lens: Secondary | ICD-10-CM | POA: Diagnosis present

## 2019-09-15 DIAGNOSIS — M79604 Pain in right leg: Secondary | ICD-10-CM | POA: Diagnosis not present

## 2019-09-15 DIAGNOSIS — E785 Hyperlipidemia, unspecified: Secondary | ICD-10-CM | POA: Diagnosis present

## 2019-09-15 DIAGNOSIS — X58XXXA Exposure to other specified factors, initial encounter: Secondary | ICD-10-CM | POA: Diagnosis present

## 2019-09-15 DIAGNOSIS — Z87442 Personal history of urinary calculi: Secondary | ICD-10-CM

## 2019-09-15 DIAGNOSIS — N181 Chronic kidney disease, stage 1: Secondary | ICD-10-CM

## 2019-09-15 DIAGNOSIS — Z96612 Presence of left artificial shoulder joint: Secondary | ICD-10-CM | POA: Diagnosis present

## 2019-09-15 DIAGNOSIS — K4020 Bilateral inguinal hernia, without obstruction or gangrene, not specified as recurrent: Secondary | ICD-10-CM | POA: Diagnosis present

## 2019-09-15 DIAGNOSIS — M549 Dorsalgia, unspecified: Secondary | ICD-10-CM | POA: Diagnosis present

## 2019-09-15 DIAGNOSIS — G2581 Restless legs syndrome: Secondary | ICD-10-CM | POA: Diagnosis present

## 2019-09-15 DIAGNOSIS — I110 Hypertensive heart disease with heart failure: Secondary | ICD-10-CM | POA: Diagnosis present

## 2019-09-15 DIAGNOSIS — K5790 Diverticulosis of intestine, part unspecified, without perforation or abscess without bleeding: Secondary | ICD-10-CM | POA: Diagnosis present

## 2019-09-15 DIAGNOSIS — E7800 Pure hypercholesterolemia, unspecified: Secondary | ICD-10-CM | POA: Diagnosis present

## 2019-09-15 DIAGNOSIS — Z20822 Contact with and (suspected) exposure to covid-19: Secondary | ICD-10-CM | POA: Diagnosis present

## 2019-09-15 DIAGNOSIS — Z9842 Cataract extraction status, left eye: Secondary | ICD-10-CM

## 2019-09-15 DIAGNOSIS — Z9049 Acquired absence of other specified parts of digestive tract: Secondary | ICD-10-CM

## 2019-09-15 DIAGNOSIS — Z7984 Long term (current) use of oral hypoglycemic drugs: Secondary | ICD-10-CM

## 2019-09-15 DIAGNOSIS — G8929 Other chronic pain: Secondary | ICD-10-CM | POA: Diagnosis present

## 2019-09-15 DIAGNOSIS — Z03818 Encounter for observation for suspected exposure to other biological agents ruled out: Secondary | ICD-10-CM | POA: Diagnosis not present

## 2019-09-15 DIAGNOSIS — Z9682 Presence of neurostimulator: Secondary | ICD-10-CM

## 2019-09-15 DIAGNOSIS — S8010XA Contusion of unspecified lower leg, initial encounter: Secondary | ICD-10-CM | POA: Diagnosis not present

## 2019-09-15 DIAGNOSIS — I2510 Atherosclerotic heart disease of native coronary artery without angina pectoris: Secondary | ICD-10-CM | POA: Diagnosis present

## 2019-09-15 DIAGNOSIS — I5032 Chronic diastolic (congestive) heart failure: Secondary | ICD-10-CM | POA: Diagnosis present

## 2019-09-15 DIAGNOSIS — Z79899 Other long term (current) drug therapy: Secondary | ICD-10-CM

## 2019-09-15 LAB — I-STAT CHEM 8, ED
BUN: 12 mg/dL (ref 8–23)
Calcium, Ion: 1.09 mmol/L — ABNORMAL LOW (ref 1.15–1.40)
Chloride: 105 mmol/L (ref 98–111)
Creatinine, Ser: 0.8 mg/dL (ref 0.61–1.24)
Glucose, Bld: 151 mg/dL — ABNORMAL HIGH (ref 70–99)
HCT: 37 % — ABNORMAL LOW (ref 39.0–52.0)
Hemoglobin: 12.6 g/dL — ABNORMAL LOW (ref 13.0–17.0)
Potassium: 4 mmol/L (ref 3.5–5.1)
Sodium: 142 mmol/L (ref 135–145)
TCO2: 28 mmol/L (ref 22–32)

## 2019-09-15 LAB — CBC WITH DIFFERENTIAL/PLATELET
Abs Immature Granulocytes: 0.03 10*3/uL (ref 0.00–0.07)
Basophils Absolute: 0 10*3/uL (ref 0.0–0.1)
Basophils Relative: 1 %
Eosinophils Absolute: 0.1 10*3/uL (ref 0.0–0.5)
Eosinophils Relative: 1 %
HCT: 41.9 % (ref 39.0–52.0)
Hemoglobin: 13 g/dL (ref 13.0–17.0)
Immature Granulocytes: 0 %
Lymphocytes Relative: 19 %
Lymphs Abs: 1.7 10*3/uL (ref 0.7–4.0)
MCH: 28.1 pg (ref 26.0–34.0)
MCHC: 31 g/dL (ref 30.0–36.0)
MCV: 90.7 fL (ref 80.0–100.0)
Monocytes Absolute: 0.8 10*3/uL (ref 0.1–1.0)
Monocytes Relative: 10 %
Neutro Abs: 6.1 10*3/uL (ref 1.7–7.7)
Neutrophils Relative %: 69 %
Platelets: 175 10*3/uL (ref 150–400)
RBC: 4.62 MIL/uL (ref 4.22–5.81)
RDW: 14 % (ref 11.5–15.5)
WBC: 8.7 10*3/uL (ref 4.0–10.5)
nRBC: 0 % (ref 0.0–0.2)

## 2019-09-15 LAB — BASIC METABOLIC PANEL
Anion gap: 9 (ref 5–15)
BUN: 10 mg/dL (ref 8–23)
CO2: 27 mmol/L (ref 22–32)
Calcium: 8.7 mg/dL — ABNORMAL LOW (ref 8.9–10.3)
Chloride: 107 mmol/L (ref 98–111)
Creatinine, Ser: 0.88 mg/dL (ref 0.61–1.24)
GFR calc Af Amer: >60 mL/min (ref >60)
GFR calc non Af Amer: >60 mL/min (ref >60)
Glucose, Bld: 143 mg/dL — ABNORMAL HIGH (ref 70–99)
Potassium: 4.4 mmol/L (ref 3.5–5.1)
Sodium: 143 mmol/L (ref 135–145)

## 2019-09-15 LAB — CBG MONITORING, ED: Glucose-Capillary: 140 mg/dL — ABNORMAL HIGH (ref 70–99)

## 2019-09-15 LAB — LACTIC ACID, PLASMA: Lactic Acid, Venous: 1.4 mmol/L (ref 0.5–1.9)

## 2019-09-15 LAB — HEMOGLOBIN A1C
Hgb A1c MFr Bld: 8.3 % — ABNORMAL HIGH (ref 4.8–5.6)
Mean Plasma Glucose: 191.51 mg/dL

## 2019-09-15 MED ORDER — ATORVASTATIN CALCIUM 10 MG PO TABS
10.0000 mg | ORAL_TABLET | Freq: Every day | ORAL | Status: DC
  Administered 2019-09-16 – 2019-09-17 (×2): 10 mg via ORAL
  Filled 2019-09-15 (×2): qty 1

## 2019-09-15 MED ORDER — ASPIRIN 81 MG PO CHEW
162.0000 mg | CHEWABLE_TABLET | Freq: Once | ORAL | Status: AC
  Administered 2019-09-15: 162 mg via ORAL
  Filled 2019-09-15: qty 2

## 2019-09-15 MED ORDER — SODIUM CHLORIDE 0.9% FLUSH: 10.0000 mL | INTRAVENOUS | Status: DC | PRN

## 2019-09-15 MED ORDER — ROPINIROLE HCL 1 MG PO TABS
1.0000 mg | ORAL_TABLET | Freq: Every day | ORAL | Status: DC
  Administered 2019-09-16: 1 mg via ORAL
  Filled 2019-09-15 (×3): qty 1

## 2019-09-15 MED ORDER — POTASSIUM CHLORIDE ER 10 MEQ PO TBCR
10.0000 meq | EXTENDED_RELEASE_TABLET | Freq: Every day | ORAL | Status: DC
  Administered 2019-09-17: 10 meq via ORAL
  Filled 2019-09-15 (×3): qty 1

## 2019-09-15 MED ORDER — METOPROLOL SUCCINATE ER 25 MG PO TB24
25.0000 mg | ORAL_TABLET | Freq: Every day | ORAL | Status: DC
  Administered 2019-09-16 – 2019-09-17 (×2): 25 mg via ORAL
  Filled 2019-09-15 (×2): qty 1

## 2019-09-15 MED ORDER — IOHEXOL 350 MG/ML SOLN
100.0000 mL | Freq: Once | INTRAVENOUS | Status: AC | PRN
  Administered 2019-09-15: 100 mL via INTRAVENOUS

## 2019-09-15 MED ORDER — BRIMONIDINE TARTRATE 0.15 % OP SOLN
1.0000 [drp] | Freq: Two times a day (BID) | OPHTHALMIC | Status: DC
  Administered 2019-09-16 – 2019-09-17 (×3): 1 [drp] via OPHTHALMIC
  Filled 2019-09-15: qty 5

## 2019-09-15 MED ORDER — FUROSEMIDE 20 MG PO TABS
20.0000 mg | ORAL_TABLET | ORAL | Status: DC
  Administered 2019-09-16: 20 mg via ORAL
  Filled 2019-09-15: qty 1

## 2019-09-15 MED ORDER — ACETAMINOPHEN 650 MG RE SUPP: 650.0000 mg | Freq: Four times a day (QID) | RECTAL | Status: DC | PRN

## 2019-09-15 MED ORDER — MORPHINE SULFATE (PF) 4 MG/ML IV SOLN
4.0000 mg | Freq: Once | INTRAVENOUS | Status: AC
  Administered 2019-09-15: 4 mg via INTRAVENOUS
  Filled 2019-09-15: qty 1

## 2019-09-15 MED ORDER — ONDANSETRON HCL 4 MG PO TABS: 4.0000 mg | ORAL_TABLET | Freq: Four times a day (QID) | ORAL | Status: DC | PRN

## 2019-09-15 MED ORDER — SODIUM CHLORIDE 0.9% FLUSH
10.0000 mL | Freq: Two times a day (BID) | INTRAVENOUS | Status: DC
  Administered 2019-09-16 – 2019-09-17 (×2): 10 mL

## 2019-09-15 MED ORDER — ONDANSETRON HCL 4 MG/2ML IJ SOLN: 4.0000 mg | Freq: Four times a day (QID) | INTRAMUSCULAR | Status: DC | PRN

## 2019-09-15 MED ORDER — INSULIN ASPART 100 UNIT/ML ~~LOC~~ SOLN: 0.0000 [IU] | Freq: Every day | SUBCUTANEOUS | Status: DC

## 2019-09-15 MED ORDER — INSULIN ASPART 100 UNIT/ML ~~LOC~~ SOLN
0.0000 [IU] | Freq: Three times a day (TID) | SUBCUTANEOUS | Status: DC
  Administered 2019-09-16 (×2): 3 [IU] via SUBCUTANEOUS
  Administered 2019-09-17 (×2): 5 [IU] via SUBCUTANEOUS

## 2019-09-15 MED ORDER — AMIODARONE HCL 200 MG PO TABS
200.0000 mg | ORAL_TABLET | Freq: Every day | ORAL | Status: DC
  Administered 2019-09-16 – 2019-09-17 (×2): 200 mg via ORAL
  Filled 2019-09-15 (×2): qty 1

## 2019-09-15 MED ORDER — MORPHINE SULFATE (PF) 4 MG/ML IV SOLN
4.0000 mg | Freq: Once | INTRAVENOUS | Status: AC
  Administered 2019-09-15: 16:00:00 4 mg via INTRAVENOUS
  Filled 2019-09-15: qty 1

## 2019-09-15 MED ORDER — ACETAMINOPHEN 325 MG PO TABS
650.0000 mg | ORAL_TABLET | Freq: Four times a day (QID) | ORAL | Status: DC | PRN
  Administered 2019-09-16: 650 mg via ORAL
  Filled 2019-09-15: qty 2

## 2019-09-15 MED ORDER — FLUTICASONE PROPIONATE 50 MCG/ACT NA SUSP
1.0000 | Freq: Every day | NASAL | Status: DC
  Administered 2019-09-16 (×2): 1 via NASAL
  Filled 2019-09-15: qty 16

## 2019-09-15 MED ORDER — PANTOPRAZOLE SODIUM 40 MG PO TBEC
40.0000 mg | DELAYED_RELEASE_TABLET | Freq: Every day | ORAL | Status: DC
  Administered 2019-09-16 – 2019-09-17 (×2): 40 mg via ORAL
  Filled 2019-09-15 (×2): qty 1

## 2019-09-15 NOTE — Consult Note (Signed)
[facility]  Patient name: Brian Ellison MRN: [id number] DOB: [birth year] Sex: male   REQUESTING PROVIDER:    ER   REASON FOR CONSULT:    Calf hematoma  HISTORY OF PRESENT ILLNESS:   Brian Ellison is a 79 y.o. male, who presented to the emergency department this morning with complaints of right calf pain.  This began when he woke up Sunday morning.  There has been no change in the level of pain since Sunday.  He did not improve and was having trouble putting weight on the leg and walking and so he came to the emergency department.  He had a CT angiogram that showed a right calf hematoma.  The patient is on Xarelto for atrial fibrillation.  His last dose was yesterday.  The patient is a diabetic.  His most recent hemoglobin A1c was 8.1.  He takes a statin for hypercholesterolemia.  He is medically managed for hypertension.  He is a former smoker having quit in 1972.  PAST MEDICAL HISTORY    Past Medical History:  Diagnosis Date  . Allergic rhinitis   . Arthritis    SPINAL AND JOINTS  . Bladder polyp   . BPH (benign prostatic hypertrophy)   . Chronic back pain   . Diverticulosis   . Dyslipidemia    per pt questionable  . ED (erectile dysfunction) of organic origin   . GERD (gastroesophageal reflux disease)   . Glaucoma    BOTH EYES  . History of colon polyps   . History of gastritis   . History of kidney stones    many yrs ago  . 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 History of shingles   . Lower urinary tract symptoms (LUTS)   . OSA on CPAP    very severe per study 01-31-2006  . Peyronie's disease   . S/P insertion of spinal cord stimulator    2010  . Tingling    right arm;takes Gabapentin  . Type 2 diabetes mellitus ([place])      FAMILY HISTORY   No family history on file.  SOCIAL HISTORY:   Social History   Socioeconomic History  .  Marital status: Married    Spouse name: Not on file  . Number of children: 3  . Years of education: 103  . Highest education level: Not on file  Occupational History  . Not on file  Tobacco Use  . Smoking status: Former Smoker    Packs/day: 1.00    Years: 15.00    Pack years: 15.00    Types: Cigarettes    Quit date: 07/08/1971    Years since quitting: 48.2  . Smokeless tobacco: Never Used  Substance and Sexual Activity  . Alcohol use: Yes    Comment: rare  . Drug use: No  . Sexual activity: Not on file  Other Topics Concern  . Not on file  Social History Narrative  . Not on file   Social Determinants of Health   Financial Resource Strain: Low Risk   . Difficulty of Paying Living Expenses: Not hard at all  Food Insecurity: No Food Insecurity  . Worried About Charity fundraiser in the Last Year: Never true  . Ran Out of Food in the Last Year: Never true  Transportation Needs: No Transportation Needs  . Lack of Transportation (Medical): No  . Lack of Transportation (Non-Medical):  No  Physical Activity: Inactive  . Days of Exercise per Week: 0 days  . Minutes of Exercise per Session: 0 min  Stress: No Stress Concern Present  . Feeling of Stress : Only a little  Social Connections: Slightly Isolated  . Frequency of Communication with Friends and Family: Three times a week  . Frequency of Social Gatherings with Friends and Family: Once a week  . Attends Religious Services: More than 4 times per year  . Active Member of Clubs or Organizations: No  . Attends Archivist Meetings: Never  . Marital Status: Married  Human resources officer Violence: Not At Risk  . Fear of Current or Ex-Partner: No  . Emotionally Abused: No  . Physically Abused: No  . Sexually Abused: No    ALLERGIES:    No Known Allergies  CURRENT MEDICATIONS:    Current Facility-Administered Medications  Medication Dose Route Frequency Provider Last Rate Last Admin  . morphine 4 MG/ML  injection 4 mg  4 mg Intravenous Once Ford, Kelsey N, PA-C      . sodium chloride flush (NS) 0.9 % injection 10-40 mL  10-40 mL Intracatheter Q12H Carmin Muskrat, MD      . sodium chloride flush (NS) 0.9 % injection 10-40 mL  10-40 mL Intracatheter PRN Carmin Muskrat, MD       Current Outpatient Medications  Medication Sig Dispense Refill  . amiodarone (PACERONE) 200 MG tablet Take 1 tablet (200 mg total) by mouth daily. 90 tablet 3  . ARTIFICIAL TEAR SOLUTION OP Place 1 drop into both eyes daily as needed (dry eyes).    Marland Kitchen atorvastatin (LIPITOR) 10 MG tablet Take 1 tablet (10 mg total) by mouth daily. 90 tablet 3  . brimonidine (ALPHAGAN) 0.15 % ophthalmic solution Place 1 drop into both eyes 2 (two) times daily.    . cetirizine (ZYRTEC) 10 MG tablet Take 10 mg by mouth daily.    . fluticasone (FLONASE) 50 MCG/ACT nasal spray Place 1 spray into both nostrils at bedtime.    . furosemide (LASIX) 20 MG tablet Take 1 tablet (20 mg total) by mouth every other day. 10mg  every other day 90 tablet 3  . metFORMIN (GLUCOPHAGE) 500 MG tablet Take 1,000 mg by mouth 2 (two) times daily with a meal.     . Metoprolol Succinate 25 MG CS24 Take 25 mg by mouth daily.    . Multiple Vitamins-Minerals (VITAMIN D3 COMPLETE PO) Take 50 mcg by mouth daily.    . pantoprazole (PROTONIX) 40 MG tablet Take 40 mg by mouth daily.    . potassium chloride (K-DUR) 10 MEQ tablet Take 1 tablet (10 mEq total) by mouth daily. 90 tablet 3  . rivaroxaban (XARELTO) 20 MG TABS tablet Take 1 tablet (20 mg total) by mouth daily with supper for 30 days. 30 tablet 0  . rOPINIRole (REQUIP) 1 MG tablet Take 1 mg by mouth at bedtime.      . vitamin B-12 (CYANOCOBALAMIN) 500 MCG tablet Take 750 mcg by mouth daily.       REVIEW OF SYSTEMS:   [X]  denotes positive finding, [ ]  denotes negative finding Cardiac  Comments:  Chest pain or chest pressure:    Shortness of breath upon exertion:    Short of breath when lying flat:     Irregular heart rhythm:        Vascular    Pain in calf, thigh, or hip brought on by ambulation: x   Pain in feet at  night that wakes you up from your sleep:     Blood clot in your veins:    Leg swelling:  x       Pulmonary    Oxygen at home:    Productive cough:     Wheezing:         Neurologic    Sudden weakness in arms or legs:     Sudden numbness in arms or legs:     Sudden onset of difficulty speaking or slurred speech:    Temporary loss of vision in one eye:     Problems with dizziness:         Gastrointestinal    Blood in stool:      Vomited blood:         Genitourinary    Burning when urinating:     Blood in urine:        Psychiatric    Major depression:         Hematologic    Bleeding problems:    Problems with blood clotting too easily:        Skin    Rashes or ulcers:        Constitutional    Fever or chills:     PHYSICAL EXAM:   Vitals:   09/15/19 1615 09/15/19 1630 09/15/19 1830 09/15/19 1900  BP: (!) 158/84 (!) 146/74    Pulse: 69 68 72 73  Resp: 20     Temp:      TempSrc:      SpO2: 93% 92% 94% 95%    GENERAL: The patient is a well-nourished male, in no acute distress. The vital signs are documented above. CARDIAC: There is a regular rate and rhythm.  VASCULAR: Brisk posterior tibial Doppler signal on the right.  He does have mild lower extremity edema in the right leg. PULMONARY: Nonlabored respirations ABDOMEN: Soft and non-tender with normal pitched bowel sounds.  MUSCULOSKELETAL: There is fullness and tenderness in the right calf over top of the gastrocnemius muscle. NEUROLOGIC: Motor and sensory function are intact in the right leg SKIN: There are no ulcers or rashes noted. PSYCHIATRIC: The patient has a normal affect.  STUDIES:   I have reviewed his CT scan with the following findings: Changes consistent with focal hematoma within the deep musculature of the right calf as described. This is likely related to the patient's  known blood thinner use.  No evidence of arterial emboli or occlusive changes are seen. Normal three-vessel runoff to the ankle on the right is noted. ASSESSMENT and PLAN   Right calf hematoma in the setting of anticoagulation: The biggest concern I have is for compartment syndrome however at this point, I feel his compartments are soft he just has tenderness due to the fullness in the calf.  He has motor and sensory function intact in the right leg as well as normal Doppler signals.  His pain has been consistent for greater than 48 hours.  We discussed the possibility of fasciotomy and decompression.  At this point I will try to manage this nonoperatively.  His last dose of Xarelto was last night.  He is scheduled to be admitted by the hospitalist service.  I will continue to follow the patient.  I will reevaluate him in the morning.  He should be kept n.p.o. after midnight in case he needs surgical decompression.  I told the patient to contact me if he has any changes in his symptomatology including worsening pain, loss of  sensation or decreased motor function in his leg.  I also encouraged him to keep his leg elevated.  He should stay off of his Xarelto for now.   Leia Alf, MD, FACS [facility] [phone] Pager [phone]

## 2019-09-15 NOTE — ED Notes (Signed)
Pt is NSR on monitor 

## 2019-09-15 NOTE — H&P (Signed)
History and Physical    [name] [id number] DOB: [birth year] DOA: 09/15/2019  PCP: [facility]  Patient coming from: Home  I have personally briefly reviewed patient's old medical records in [place]  Chief Complaint: R leg pain  HPI: Brian Ellison is a 79 y.o. male with medical history significant of PAF on xarelto, DM2.  Patient presents to the ED with c/o R leg pain and swelling to anterior lower leg.  Onset Sunday morning.  No associated fevers, no injury or trauma.  Symptoms constant.  Nothing makes better or worse.   ED Course: CTA shows no arterial occlusion, shows hematoma.  No DVT on [place].  Dr. Trula Slade saw patient, wants overnight obs in case he develops compartment syndrome.   Review of Systems: As per HPI, otherwise all review of systems negative.  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   History of shingles    Lower urinary tract symptoms (LUTS)    OSA on CPAP    very severe per study 01-31-2006   Peyronie's disease    S/P insertion of spinal cord stimulator    2010   Tingling    right arm;takes Gabapentin   Type 2 diabetes mellitus ([place])     Past Surgical History:  Procedure Laterality Date   ANTERIOR CERVICAL DECOMP/DISCECTOMY FUSION  06-16-2004   C3 -- C5   APPENDECTOMY  age 69   CATARACT EXTRACTION W/ INTRAOCULAR LENS  IMPLANT, BILATERAL     COLONOSCOPY     CYSTOSCOPY WITH BIOPSY N/A 07/15/2015   Procedure:  CYSTOSCOPY WITH BLADDER AND PROSTATE BIOPSY;  Surgeon: Festus Aloe, MD;  Location: [facility];  Service: Urology;  Laterality: N/A;   HAND SURGERY Right 2017   "right hand middle finger knuckle replaced"   INGUINAL HERNIA REPAIR Bilateral 1970's   NASAL SEPTUM SURGERY  yrs ago   PENILE PROSTHESIS IMPLANT  11-26-2007   PERMANANT SPINAL CORD STIMULATOR IMPLANT VIA THORACIC LAMINECTOMY  05-12-2009   Medtronic generator (left buttock)   POSTERIOR LAMINECTOMY / DECOMPRESSION CERVICAL SPINE  03-08-2005   C3  -- C6   RETINAL DETACHMENT SURGERY Right    REVERSE SHOULDER ARTHROPLASTY Left 09/23/2015   Procedure: LEFT REVERSE TOTAL SHOULDER ARTHROPLASTY;  Surgeon: Netta Cedars, MD;  Location: [facility];  Service: Orthopedics;  Laterality: Left;   REVISION TOTAL ARTHROPLASTY RIGHT SHOULDER  07-22-2009   RIGHT HEEL SURGERY     spurs   RIGHT SHOULDER HEMIARTHROPLASTY  08-20-2008   ROTATOR CUFF REPAIR Bilateral x1 right/  x2  left - last one 2013   SHOULDER OPEN ROTATOR CUFF REPAIR Bilateral    SPINAL CORD STIMULATOR BATTERY EXCHANGE N/A 07/29/2018   Procedure: Spinal cord stimulator/implantable pulse generator battery change;  Surgeon: Erline Levine, MD;  Location: [facility];  Service: Neurosurgery;  Laterality: N/A;  Spinal  cord stimulator/implantable pulse generator battery change   TONSILLECTOMY     TOTAL KNEE ARTHROPLASTY Right 04/17/2013   Procedure: RIGHT TOTAL KNEE ARTHROPLASTY;  Surgeon: Augustin Schooling, MD;  Location: [facility];  Service: Orthopedics;  Laterality: Right;   TOTAL KNEE ARTHROPLASTY Left 02-10-2009     reports that he quit smoking about 48 years ago. His smoking use included cigarettes. He has a 15.00 pack-year smoking history. He has never used smokeless tobacco. He reports current alcohol use. He reports that he does not use drugs.  No Known Allergies  No family history on file. No reported sick contacts.  Prior to Admission medications    Medication Sig Start Date End Date Taking? Authorizing Provider  amiodarone (PACERONE) 200 MG tablet Take 1 tablet (200 mg total) by mouth daily. 08/13/19   Sueanne Margarita, MD  ARTIFICIAL TEAR SOLUTION OP Place 1 drop into both eyes daily as needed (dry eyes).    [provider]  atorvastatin (LIPITOR) 10 MG tablet Take 1 tablet (10 mg total) by mouth daily. 02/09/19 02/09/20  Sueanne Margarita, MD  brimonidine (ALPHAGAN) 0.15 % ophthalmic solution Place 1 drop into both eyes 2 (two) times daily.    [provider]  cetirizine (ZYRTEC) 10 MG tablet Take 10 mg by mouth daily.    [provider]  fluticasone (FLONASE) 50 MCG/ACT nasal spray Place 1 spray into both nostrils at bedtime.    [provider]  furosemide (LASIX) 20 MG tablet Take 1 tablet (20 mg total) by mouth every other day. 10mg  every other day 05/29/19   Sueanne Margarita, MD  metFORMIN (GLUCOPHAGE) 500 MG tablet Take 1,000 mg by mouth 2 (two) times daily with a meal.     [provider]  Metoprolol Succinate 25 MG CS24 Take 25 mg by mouth daily.    [provider]  Multiple Vitamins-Minerals (VITAMIN D3 COMPLETE PO) Take 50 mcg by mouth daily.    [provider]  pantoprazole (PROTONIX) 40 MG tablet Take 40 mg by mouth daily.    [provider]  potassium chloride (K-DUR) 10 MEQ tablet Take 1 tablet (10 mEq total) by mouth daily. 11/12/18   Fenton, Clint R, PA  rivaroxaban (XARELTO) 20 MG TABS tablet Take 1 tablet (20 mg total) by mouth daily with supper for 30 days. 10/05/18   Dorrell, Andree Elk, MD  rOPINIRole (REQUIP) 1 MG tablet Take 1 mg by mouth at bedtime.      [provider]  vitamin B-12 (CYANOCOBALAMIN) 500 MCG tablet Take 750 mcg by mouth daily.     [provider]    Physical Exam: Vitals:   09/15/19 1830 09/15/19 1900 09/15/19 2045 09/15/19 2100  BP:    (!) 178/92  Pulse: 72 73 77 89  Resp:      Temp:      TempSrc:      SpO2: 94% 95%  96%     Constitutional: NAD, calm, comfortable Eyes: PERRL, lids and conjunctivae normal ENMT: Mucous membranes are moist. Posterior pharynx clear of any exudate or lesions.Normal dentition.  Neck: normal, supple, no masses, no thyromegaly Respiratory: clear to auscultation bilaterally, no wheezing, no crackles. Normal respiratory effort. No accessory muscle use.  Cardiovascular: Regular rate and rhythm, no murmurs / rubs / gallops. No extremity edema. 2+ pedal pulses. No carotid bruits.  Abdomen: no tenderness, no masses palpated. No hepatosplenomegaly. Bowel sounds positive.  Musculoskeletal: no clubbing / cyanosis. No joint deformity upper and  lower extremities. Good ROM, no contractures. Normal muscle tone.  Skin: Erythema to anterior R lower leg, firm Neurologic: CN 2-12 grossly intact. Sensation intact, DTR normal. Strength 5/5 in all 4.  Psychiatric: Normal judgment and insight. Alert and oriented x 3. Normal mood.    Labs on Admission: I have personally reviewed following labs and imaging studies  CBC: Recent Labs  Lab 09/15/19 1410 09/15/19 1643  WBC 8.7  --   NEUTROABS 6.1  --   HGB 13.0 12.6*  HCT 41.9 37.0*  MCV 90.7  --   PLT 175  --    Basic Metabolic Panel: Recent Labs  Lab 09/15/19 1643 09/15/19 1900  NA 142 143  K 4.0 4.4  CL 105 107  CO2  --  27  GLUCOSE 151* 143*  BUN 12 10  CREATININE 0.80 0.88  CALCIUM  --  8.7*   GFR: CrCl cannot be calculated (Unknown ideal weight.). Liver Function Tests: No results for input(s): AST, ALT, ALKPHOS, BILITOT, PROT, ALBUMIN in the last 168 hours. No results for input(s): LIPASE, AMYLASE in the last 168 hours. No results for input(s): AMMONIA in the last 168 hours. Coagulation Profile: No results for input(s): INR, PROTIME in the last 168 hours. Cardiac Enzymes: No results for input(s): CKTOTAL, CKMB, CKMBINDEX, TROPONINI in the last 168 hours. BNP (last 3 results) No results for input(s): PROBNP in the last  8760 hours. HbA1C: No results for input(s): HGBA1C in the last 72 hours. CBG: Recent Labs  Lab 09/15/19 2104  GLUCAP 140*   Lipid Profile: No results for input(s): CHOL, HDL, LDLCALC, TRIG, CHOLHDL, LDLDIRECT in the last 72 hours. Thyroid Function Tests: No results for input(s): TSH, T4TOTAL, FREET4, T3FREE, THYROIDAB in the last 72 hours. Anemia Panel: No results for input(s): VITAMINB12, FOLATE, FERRITIN, TIBC, IRON, RETICCTPCT in the last 72 hours. Urine analysis:    Component Value Date/Time   COLORURINE YELLOW 10/04/2018 1947   APPEARANCEUR CLEAR 10/04/2018 1947   LABSPEC 1.028 10/04/2018 1947   PHURINE 6.0 10/04/2018 1947   GLUCOSEU >=500 (A) 10/04/2018 [place] NEGATIVE 10/04/2018 [place] NEGATIVE 10/04/2018 [place] NEGATIVE 10/04/2018 1947   PROTEINUR NEGATIVE 10/04/2018 1947   UROBILINOGEN 1.0 07/15/2009 0857   NITRITE NEGATIVE 10/04/2018 1947   LEUKOCYTESUR NEGATIVE 10/04/2018 1947    Radiological Exams on Admission: CT ANGIO LOW EXTREM RIGHT W &/OR WO CONTRAST  Result Date: 09/15/2019 CLINICAL DATA:  Right lower extremity pain and swelling, initial encounter EXAM: CT ANGIOGRAPHY OF THE RIGHT LOWEREXTREMITY TECHNIQUE: Multidetector CT imaging of the right lower extremitywas performed using the standard protocol during bolus administration of intravenous contrast. Multiplanar CT image reconstructions and MIPs were obtained to evaluate the vascular anatomy. CONTRAST:  180mL OMNIPAQUE IOHEXOL 350 MG/ML SOLN COMPARISON:  None. FINDINGS: Vascular: Distal abdominal aorta demonstrates atherosclerotic calcifications although no aneurysmal dilatation is seen. Visualized portions of the left iliac system are within normal limits. Right common, external and internal iliac arteries are widely patent with mild atherosclerotic calcifications. No filling defects or focal stenoses are seen. Right common femoral artery and femoral bifurcation are within normal  limits. The superficial femoral artery demonstrates mild atherosclerotic calcifications without aneurysmal dilatation or focal stenosis. The visualized portions of the popliteal artery are widely patent although the midportion is obscured due to scatter artifact from the patient's known knee replacement. Right popliteal trifurcation shows patency of the vessels with evidence of mild atherosclerotic calcifications. The vessels appear patent to the level of  the ankle with the anterior tibial and posterior tibial arteries continuing into the foot. No focal high-grade stenosis or filling defect to suggest embolism is seen. No venous abnormality is noted. Nonvascular: In the posterior musculature of the right calf there is a 3.0 x 3.1 x 6.4 cm mildly hyperdense area with a fluid-fluid level within. This likely represents a focal muscular hematoma just deep to the soleus muscle within the flexor muscles of the calf. Patient gives a history of Xarelto use. Correlation with any recent trauma is recommended. The remainder of the muscular structures of the lower extremity on the right are noted. 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 The visualized portions of the bladder and bowel are unremarkable. Degenerative changes of lumbar spine are seen. No lower extremity bony abnormality is noted. Review of the MIP images confirms the above findings. IMPRESSION: Changes consistent with focal hematoma within the deep musculature of the right calf as described. This is likely related to the patient's known blood thinner use. No evidence of arterial emboli or occlusive changes are seen. Normal three-vessel runoff to the ankle on the right is noted. Electronically Signed   By: Inez Catalina M.D.   On: 09/15/2019 18:55   VAS [place] LOWER EXTREMITY VENOUS (DVT) (ONLY [facility] & [facility])  Result Date: 09/15/2019  Lower Venous Study Indications: Pain and swelling in right calf for several days.   Anticoagulation: Xarelto. Limitations: Body habitus. Performing Technologist: Oda Cogan RDMS, RVT  Examination Guidelines: A complete evaluation includes B-mode imaging, spectral Doppler, color Doppler, and power Doppler as needed of all accessible portions of each vessel. Bilateral testing is considered an integral part of a complete examination. Limited examinations for reoccurring indications may be performed as noted.  +---------+---------------+---------+-----------+----------+------------------+  RIGHT     Compressibility Phasicity Spontaneity Properties Thrombus Aging      +---------+---------------+---------+-----------+----------+------------------+  CFV       Full            Yes       Yes                                        +---------+---------------+---------+-----------+----------+------------------+  SFJ       Full            Yes       Yes                                        +---------+---------------+---------+-----------+----------+------------------+  FV Prox   Full                                                                 +---------+---------------+---------+-----------+----------+------------------+  FV Mid    Full                                                                 +---------+---------------+---------+-----------+----------+------------------+  FV Distal Full                                                                 +---------+---------------+---------+-----------+----------+------------------+  PFV       Full                                                                 +---------+---------------+---------+-----------+----------+------------------+  POP       Full            Yes       Yes                                        +---------+---------------+---------+-----------+----------+------------------+  PTV                                                        Patent with color                                                               flow                 +---------+---------------+---------+-----------+----------+------------------+  PERO                                                       Patent with color                                                               flow                +---------+---------------+---------+-----------+----------+------------------+ No evidence of obvious DVT in the calf veins by color flow imaging.  +----+---------------+---------+-----------+----------+--------------+  LEFT Compressibility Phasicity Spontaneity Properties Thrombus Aging  +----+---------------+---------+-----------+----------+--------------+  CFV  Full            Yes       Yes                                    +----+---------------+---------+-----------+----------+--------------+  SFJ  Full                                                             +----+---------------+---------+-----------+----------+--------------+  Summary: Right: There is no evidence of deep vein thrombosis in the lower extremity. No cystic structure found in the popliteal fossa. Left: No evidence of common femoral vein obstruction.  *See table(s) above for measurements and observations. Electronically signed by Harold Barban MD on 09/15/2019 at 1:43:28 PM.    Final     EKG: Independently reviewed.  Assessment/Plan Principal Problem:   Hematoma of right lower leg Active Problems:   PAF (paroxysmal atrial fibrillation) (HCC)   DM (diabetes mellitus), type 2 ([place])    1. RLE hematoma - 1. See Dr. Stephens Shire note 2. Obs overnight 3. Hold Xarelto 2. PAF - 1. Hold Xarelto 2. Continue amiodarone once med rec completed 3. DM2 - 1. Holding metformin 2. Mod scale SSI AC/HS  DVT prophylaxis: SCD on left Code Status: Full Family Communication: No family in room Disposition Plan: Home after admit Consults called: Dr. Trula Slade Admission status: Place in obs   Naseer Hearn, [facility]  How to contact the [facility] Attending or Consulting provider [place] or  covering provider during after hours [place], for this patient?  1. Check the care team in [facility] and look for a) attending/consulting [facility] provider listed and b) the [facility] team listed 2. Log into [email or web address]  Amion Physician Scheduling and messaging for groups and whole hospitals  On call and physician scheduling software for group practices, residents, hospitalists and other medical providers for call, clinic, rotation and shift schedules. On[employer] is a hospital-wide system for scheduling doctors and paging doctors on call. EasyPlot is for scientific plotting and data analysis.  [email or web address]  and use [facility]'s universal password to access. If you do not have the password, please contact the hospital operator.  3. Locate the [facility] provider you are looking for under [facility] and page to a number that you can be directly reached. 4. If you still have difficulty reaching the provider, please page the [facility] (Director on Call) for the Hospitalists listed on amion for assistance.  09/15/2019, 9:11 PM

## 2019-09-15 NOTE — ED Provider Notes (Addendum)
[facility] EMERGENCY DEPARTMENT Provider Note   CSN: [place]:[id number] Arrival date & time: 09/15/19  [id number]     History No chief complaint on file.   Brian Ellison is a 79 y.o. male with history of hyperlipidemia, DM 2, severe obesity, hypertension  HPI  Or right lower posterior calf pain that is constant, cramping, achy and severe worse with movement and walking to the extent of being unable to walk.  Patient states that he noticed the pain immediately when he woke up Sunday morning.  Denies any history of similar pain.  Has diabetes and endorses long history of diabetic neuropathy and burning in his feet however states that this feels completely different.  Patient has a history of blood clots.  He is on Xarelto currently as he was briefly on an episode of A. fib earlier this year however states he has had no relapses into A. fib since.  Patient has any history of peripheral artery disease.  Patient has any history of claudication.  States he has had some cramps in his legs in the past but nothing similar to what is currently occurring.  Denies any new medications.  Denies any history of electrolyte abnormalities.  Patient states that he controls his hyperlipidemia is currently on atorvastatin, states he takes Metformin as prescribed for his diabetes.  He takes metoprolol, furosemide amiodarone for blood pressure.     Past Medical History:  Diagnosis Date  . Allergic rhinitis   . Arthritis    SPINAL AND JOINTS  . Bladder polyp   . BPH (benign prostatic hypertrophy)   . Chronic back pain   . Diverticulosis   . Dyslipidemia    per pt questionable  . ED (erectile dysfunction) of organic origin   . GERD (gastroesophageal reflux disease)   . Glaucoma    BOTH EYES  . History of colon polyps   . History of gastritis   . History of kidney stones    many yrs ago  . 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 History of  shingles   . Lower urinary tract symptoms (LUTS)   . OSA on CPAP    very severe per study 01-31-2006  . Peyronie's disease   . S/P insertion of spinal cord stimulator    2010  . Tingling    right arm;takes Gabapentin  . Type 2 diabetes mellitus [facility])     Patient Active Problem List   Diagnosis Date Noted  . DM (diabetes mellitus), type 2 ([place]) 02/09/2019  . Coronary artery calcification seen on CAT scan 02/08/2019  . Community acquired pneumonia   . PAF (paroxysmal atrial fibrillation) ([place]) 10/04/2018  . Chest pain 10/04/2018  . Morbid (severe) obesity due to excess calories ([place]) 12/24/2017  . S/P shoulder replacement 09/23/2015  . Esophageal reflux 06/01/2013  . Neuralgia 04/28/2013  . Restless leg syndrome 04/28/2013  . Osteoarthritis of right knee S/P right total knee arthroplasty 04/24/2013  . Right knee pain 04/24/2013  . Obstructive sleep apnea 06/27/2009  . Hyperlipidemia 06/09/2008  . Seasonal and perennial allergic rhinitis 06/09/2008    Past Surgical History:  Procedure Laterality Date  . ANTERIOR CERVICAL DECOMP/DISCECTOMY FUSION  06-16-2004   C3 -- C5  . APPENDECTOMY  age 49  . CATARACT EXTRACTION W/ INTRAOCULAR LENS  IMPLANT, BILATERAL    . COLONOSCOPY    . CYSTOSCOPY WITH BIOPSY N/A 07/15/2015   Procedure: CYSTOSCOPY WITH BLADDER  AND PROSTATE BIOPSY;  Surgeon: Festus Aloe, MD;  Location: [facility];  Service: Urology;  Laterality: N/A;  . HAND SURGERY Right 2017   "right hand middle finger knuckle replaced"  . INGUINAL HERNIA REPAIR Bilateral 1970's  . NASAL SEPTUM SURGERY  yrs ago  . PENILE PROSTHESIS IMPLANT  11-26-2007  . PERMANANT SPINAL CORD STIMULATOR IMPLANT VIA THORACIC LAMINECTOMY  05-12-2009   Medtronic generator (left buttock)  . POSTERIOR LAMINECTOMY / DECOMPRESSION CERVICAL SPINE  03-08-2005   C3  -- C6  . RETINAL DETACHMENT SURGERY Right   . REVERSE SHOULDER ARTHROPLASTY Left 09/23/2015   Procedure: LEFT REVERSE TOTAL  SHOULDER ARTHROPLASTY;  Surgeon: Netta Cedars, MD;  Location: [place];  Service: Orthopedics;  Laterality: Left;  . REVISION TOTAL ARTHROPLASTY RIGHT SHOULDER  07-22-2009  . RIGHT HEEL SURGERY     spurs  . RIGHT SHOULDER HEMIARTHROPLASTY  08-20-2008  . ROTATOR CUFF REPAIR Bilateral x1 right/  x2  left - last one 2013  . SHOULDER OPEN ROTATOR CUFF REPAIR Bilateral   . SPINAL CORD STIMULATOR BATTERY EXCHANGE N/A 07/29/2018   Procedure: Spinal cord stimulator/implantable pulse generator battery change;  Surgeon: Erline Levine, MD;  Location: [place];  Service: Neurosurgery;  Laterality: N/A;  Spinal cord stimulator/implantable pulse generator battery change  . TONSILLECTOMY    . TOTAL KNEE ARTHROPLASTY Right 04/17/2013   Procedure: RIGHT TOTAL KNEE ARTHROPLASTY;  Surgeon: Augustin Schooling, MD;  Location: [place];  Service: Orthopedics;  Laterality: Right;  . TOTAL KNEE ARTHROPLASTY Left 02-10-2009       No family history on file.  Social History   Tobacco Use  . Smoking status: Former Smoker    Packs/day: 1.00    Years: 15.00    Pack years: 15.00    Types: Cigarettes    Quit date: 07/08/1971    Years since quitting: 48.2  . Smokeless tobacco: Never Used  Substance Use Topics  . Alcohol use: Yes    Comment: rare  . Drug use: No    Home Medications Prior to Admission medications   Medication Sig Start Date End Date Taking? Authorizing Provider  amiodarone (PACERONE) 200 MG tablet Take 1 tablet (200 mg total) by mouth daily. 08/13/19   Sueanne Margarita, MD  ARTIFICIAL TEAR SOLUTION OP Place 1 drop into both eyes daily as needed (dry eyes).    [provider]  atorvastatin (LIPITOR) 10 MG tablet Take 1 tablet (10 mg total) by mouth daily. 02/09/19 02/09/20  Sueanne Margarita, MD  brimonidine (ALPHAGAN) 0.15 % ophthalmic solution Place 1 drop into both eyes 2 (two) times daily.    [provider]  cetirizine (ZYRTEC) 10 MG tablet Take 10 mg by mouth daily.    [provider]  fluticasone (FLONASE) 50 MCG/ACT nasal spray Place 1 spray into both nostrils at bedtime.    [provider]  furosemide (LASIX) 20 MG tablet Take 1 tablet (20 mg total) by mouth every other day. 10mg  every other day 05/29/19   Sueanne Margarita, MD  metFORMIN (GLUCOPHAGE) 500 MG tablet Take 1,000 mg by mouth 2 (two) times daily with a meal.     [provider]  Metoprolol Succinate 25 MG CS24 Take 25 mg by mouth daily.    [provider]  Multiple Vitamins-Minerals (VITAMIN D3 COMPLETE PO) Take 50 mcg by mouth daily.    [provider]  pantoprazole (PROTONIX) 40 MG tablet Take 40 mg by mouth daily.  [provider]  potassium chloride (K-DUR) 10 MEQ tablet Take 1 tablet (10 mEq total) by mouth daily. 11/12/18   [place], Clint R, PA  rivaroxaban (XARELTO) 20 MG TABS tablet Take 1 tablet (20 mg total) by mouth daily with supper for 30 days. 10/05/18   Dorrell, Andree Elk, MD  rOPINIRole (REQUIP) 1 MG tablet Take 1 mg by mouth at bedtime.      [provider]  vitamin B-12 (CYANOCOBALAMIN) 500 MCG tablet Take 750 mcg by mouth daily.     [provider]    Allergies    Patient has no known allergies.  Review of Systems   Review of Systems  Constitutional: Negative for chills and fever.  HENT: Negative for congestion.   Eyes: Negative for pain.  Respiratory: Negative for cough and shortness of breath.   Cardiovascular: Positive for leg swelling. Negative for chest pain.  Gastrointestinal: Negative for abdominal pain and vomiting.  Genitourinary: Negative for dysuria.  Musculoskeletal: Negative for myalgias.       Right leg pain  Skin: Negative for rash.  Neurological: Negative for dizziness and headaches.    Physical Exam Updated Vital Signs BP (!) 158/84   Pulse 69   Temp 98.2 F (36.8 C) (Oral)   Resp 20   SpO2 93%   Physical Exam Vitals and nursing note reviewed.  Constitutional:      General: He is in acute  distress.     Appearance: He is not toxic-appearing.     Comments: Appears to be an pain.  Obese 79 year old male appears physically deconditioned chronically  HENT:     Head: Normocephalic and atraumatic.     Nose: Nose normal.  Eyes:     General: No scleral icterus. Cardiovascular:     Rate and Rhythm: Normal rate and regular rhythm.     Pulses: Normal pulses.     Heart sounds: Normal heart sounds.     Comments: Right foot with 2+ PT and DP not palpable -- doppler identified a diminished DP pulse  Left foot with 3+ DP/PT pulses  HR is regular and non-tachycardic Pulmonary:     Effort: Pulmonary effort is normal. No respiratory distress.     Breath sounds: No wheezing.  Abdominal:     Palpations: Abdomen is soft.     Tenderness: There is no abdominal tenderness.  Musculoskeletal:        General: Normal range of motion.     Cervical back: Normal range of motion.     Right lower leg: No edema.     Left lower leg: No edema.     Comments: Severe tenderness to palpation of the posterior calf from the mid calf to the top of the Achilles tendon.  Right ankle with painful dorsiflexion nonpainful plantar flexion--Strength 5/5 with both.  Right leg with full range of motion with knee flexion/extension.  Skin:    General: Skin is dry.     Capillary Refill: Capillary refill takes less than 2 seconds. Cap refill is WNL BLE    Comments: Quarter sized non-tender discolored area of skin to the lateral right calf.   Posterior right calf with faint redness of the skin.  Skin is cold in bilateral feet  Neurological:     Mental Status: He is alert. Mental status is at baseline.     Comments: Sensation intact medial lateral dorsal and plantar surfaces of foot.  Psychiatric:        Mood and Affect: Mood normal.  Behavior: Behavior normal.     ED Results / Procedures / Treatments   Labs (all labs ordered are listed, but only abnormal results are displayed) Labs Reviewed  I-STAT  CHEM 8, ED - Abnormal; Notable for the following components:      Result Value   Glucose, Bld 151 (*)    Calcium, Ion 1.09 (*)    Hemoglobin 12.6 (*)    HCT 37.0 (*)    All other components within normal limits  CBC WITH DIFFERENTIAL/PLATELET  LACTIC ACID, PLASMA  BASIC METABOLIC PANEL    EKG EKG Interpretation  Date/Time:  Tuesday September 15 2019 14:37:16 EST Ventricular Rate:  68 PR Interval:    QRS Duration: 105 QT Interval:  438 QTC Calculation: 466 R Axis:   11 Text Interpretation: Sinus rhythm Prolonged PR interval Borderline T abnormalities, anterior leads Baseline wander Abnormal ECG Confirmed by Carmin Muskrat [phone]) on 09/15/2019 3:04:12 PM   Radiology VAS [place] LOWER EXTREMITY VENOUS (DVT) (ONLY [facility] & [facility])  Result Date: 09/15/2019  Lower Venous Study Indications: Pain and swelling in right calf for several days.  Anticoagulation: Xarelto. Limitations: Body habitus. Performing Technologist: Oda Cogan RDMS, RVT  Examination Guidelines: A complete evaluation includes B-mode imaging, spectral Doppler, color Doppler, and power Doppler as needed of all accessible portions of each vessel. Bilateral testing is considered an integral part of a complete examination. Limited examinations for reoccurring indications may be performed as noted.  +---------+---------------+---------+-----------+----------+------------------+ RIGHT    CompressibilityPhasicitySpontaneityPropertiesThrombus Aging     +---------+---------------+---------+-----------+----------+------------------+ CFV      Full           Yes      Yes                                     +---------+---------------+---------+-----------+----------+------------------+ SFJ      Full           Yes      Yes                                     +---------+---------------+---------+-----------+----------+------------------+ FV Prox  Full                                                             +---------+---------------+---------+-----------+----------+------------------+ FV Mid   Full                                                            +---------+---------------+---------+-----------+----------+------------------+ FV DistalFull                                                            +---------+---------------+---------+-----------+----------+------------------+ PFV      Full                                                            +---------+---------------+---------+-----------+----------+------------------+  POP      Full           Yes      Yes                                     +---------+---------------+---------+-----------+----------+------------------+ PTV                                                   Patent with color                                                        flow               +---------+---------------+---------+-----------+----------+------------------+ PERO                                                  Patent with color                                                        flow               +---------+---------------+---------+-----------+----------+------------------+ No evidence of obvious DVT in the calf veins by color flow imaging.  +----+---------------+---------+-----------+----------+--------------+ LEFTCompressibilityPhasicitySpontaneityPropertiesThrombus Aging +----+---------------+---------+-----------+----------+--------------+ CFV Full           Yes      Yes                                 +----+---------------+---------+-----------+----------+--------------+ SFJ Full                                                        +----+---------------+---------+-----------+----------+--------------+     Summary: Right: There is no evidence of deep vein thrombosis in the lower extremity. No cystic structure found in the popliteal fossa. Left: No evidence of common femoral vein  obstruction.  *See table(s) above for measurements and observations. Electronically signed by Harold Barban MD on 09/15/2019 at 1:43:28 PM.    Final     Procedures Procedures (including critical care time)  Medications Ordered in ED Medications  sodium chloride flush (NS) 0.9 % injection 10-40 mL (has no administration in time range)  sodium chloride flush (NS) 0.9 % injection 10-40 mL (has no administration in time range)  aspirin chewable tablet 162 mg (162 mg Oral Given 09/15/19 1449)  morphine 4 MG/ML injection 4 mg (4 mg Intravenous Given 09/15/19 1604)    ED Course  I have reviewed the triage vital signs and the nursing notes.  Pertinent labs & imaging results that were available during my care of the  patient were reviewed by me and considered in my medical decision making (see chart for details).  Clinical Course as of Sep 14 1656  Tue Sep 15, 2019  1657 EKG is normal sinus rhythm.  There is no irregular heart rhythm to indicate atrial fibrillation furthermore P waves are clearly evident.  ED EKG [WF]    Clinical Course User Index [WF] Tedd Sias, [place]   MDM Rules/Calculators/A&P                      Patient is an 79 year old male on Eliquis presented today with right calf pain that is severe, worse with walking, worse with touch.  Patient has history of A. fib that occurred once and has not recurred according to patient.  He is denying any heart palpitations or chest pain.  Patient does have risk factors for arterial disease including diabetes, hypertension, hyperlipidemia.  He does appear to be well treated for these however.  Broad differential for this patient includes occult fracture, DVT, arterial occlusion/embolism, cellulitis, necrotizing fasciitis, abscess, muscular strain, some myositis  In triage is normal.  CBC without leukocytosis.  Highly doubt patient has cellulitic disease.  I discussed this case with my attending physician who cosigned this note including  patient's presenting symptoms, physical exam, and planned diagnostics and interventions. Attending physician stated agreement with plan or made changes to plan which were implemented.  Attending physician assessed patient at bedside.  Patient care transferred to Benedetto Goad, PA at 4:47 PM. istat chem 8, lactic acid and CT pending at time of DC.  Anticipate CT scan to be negative for arterial occlusion or stenosis.  If this is the case patient may follow-up with vascular surgery outpatient.  I doubt patient has cellulitis.  Do not think the patient requires empiric antibiotic therapy.   Final Clinical Impression(s) / ED Diagnoses Final diagnoses:  Right calf pain    Rx / DC Orders ED Discharge Orders    None       Tedd Sias, [place] 09/15/19 [place], Allecia Bells [place], [place] 09/15/19 1658    Carmin Muskrat, MD 09/16/19 [phone]

## 2019-09-15 NOTE — Progress Notes (Signed)
Right lower ext venous duplex  has been completed. Refer to [facility] under chart review to view preliminary results.   09/15/2019  12:01 PM Kambria Grima, Bonnye Fava

## 2019-09-15 NOTE — ED Notes (Signed)
IV team at bedside 

## 2019-09-15 NOTE — ED Notes (Signed)
Patient transported to CT 

## 2019-09-15 NOTE — ED Triage Notes (Signed)
Patient complains of right calf pain and swelling. Denies injury, states it feels like a steady cramp. Currently taking xarelto, no hx DVT, no SOB

## 2019-09-15 NOTE — ED Provider Notes (Signed)
Care assumed from [place] at shift change, please see his note for full details, but in brief Brian Ellison is a 79 y.o. male who presents with right leg pain and swelling that began Sunday morning, he has noticed a small amount of erythema on the front of the leg, no associated fevers.  No injury or trauma to the leg.  On exam, pulses noted to be faint and had to be confirmed with Doppler compared to 2+ easily palpable pulses on the left concerning for arterial occlusion.  Patient has history of hypertension, hyperlipidemia and A. fib putting him at increased risk for occlusion, he is currently on Xarelto.  DVT study completed from triage which was negative.  Lab work is pending as well as CT angio of the right lower extremity.  Plan: Follow-up on CT angio results, anticipate vascular surgery consult.  Physical Exam  BP (!) 158/84   Pulse 69   Temp 98.2 F (36.8 C) (Oral)   Resp 20   SpO2 93%   Physical Exam Vitals and nursing note reviewed.  Constitutional:      General: He is not in acute distress.    Appearance: He is well-developed. He is not diaphoretic.  HENT:     Head: Normocephalic and atraumatic.  Eyes:     General:        Right eye: No discharge.        Left eye: No discharge.  Pulmonary:     Effort: Pulmonary effort is normal. No respiratory distress.  Musculoskeletal:     Comments: Tenderness and swelling noted to the right lower leg with faint erythema over the ankle.  Pulses not palpable, faint DP and PT pulse confirmed with Doppler.  In comparison the left DP and PT pulse is easily palpable and 2+  Neurological:     Mental Status: He is alert.     Coordination: Coordination normal.  Psychiatric:        Behavior: Behavior normal.     ED Course/Procedures   Labs Reviewed  I-STAT CHEM 8, ED - Abnormal; Notable for the following components:      Result Value   Glucose, Bld 151 (*)    Calcium, Ion 1.09 (*)    Hemoglobin 12.6 (*)    HCT 37.0 (*)     All other components within normal limits  CBC WITH DIFFERENTIAL/PLATELET  LACTIC ACID, PLASMA  BASIC METABOLIC PANEL   CT ANGIO LOW EXTREM RIGHT W &/OR WO CONTRAST  Result Date: 09/15/2019 CLINICAL DATA:  Right lower extremity pain and swelling, initial encounter EXAM: CT ANGIOGRAPHY OF THE RIGHT LOWEREXTREMITY TECHNIQUE: Multidetector CT imaging of the right lower extremitywas performed using the standard protocol during bolus administration of intravenous contrast. Multiplanar CT image reconstructions and MIPs were obtained to evaluate the vascular anatomy. CONTRAST:  165mL OMNIPAQUE IOHEXOL 350 MG/ML SOLN COMPARISON:  None. FINDINGS: Vascular: Distal abdominal aorta demonstrates atherosclerotic calcifications although no aneurysmal dilatation is seen. Visualized portions of the left iliac system are within normal limits. Right common, external and internal iliac arteries are widely patent with mild atherosclerotic calcifications. No filling defects or focal stenoses are seen. Right common femoral artery and femoral bifurcation are within normal limits. The superficial femoral artery demonstrates mild atherosclerotic calcifications without aneurysmal dilatation or focal stenosis. The visualized portions of the popliteal artery are widely patent although the midportion is obscured due to scatter artifact from the patient's known knee replacement. Right popliteal trifurcation shows patency of the vessels with  evidence of mild atherosclerotic calcifications. The vessels appear patent to the level of the ankle with the anterior tibial and posterior tibial arteries continuing into the foot. No focal high-grade stenosis or filling defect to suggest embolism is seen. No venous abnormality is noted. Nonvascular: In the posterior musculature of the right calf there is a 3.0 x 3.1 x 6.4 cm mildly hyperdense area with a fluid-fluid level within. This likely represents a focal muscular hematoma just deep to the soleus  muscle within the flexor muscles of the calf. Patient gives a history of Xarelto use. Correlation with any recent trauma is recommended. The remainder of the muscular structures of the lower extremity on the right are noted. 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 The visualized portions of the bladder and bowel are unremarkable. Degenerative changes of lumbar spine are seen. No lower extremity bony abnormality is noted. Review of the MIP images confirms the above findings. IMPRESSION: Changes consistent with focal hematoma within the deep musculature of the right calf as described. This is likely related to the patient's known blood thinner use. No evidence of arterial emboli or occlusive changes are seen. Normal three-vessel runoff to the ankle on the right is noted. Electronically Signed   By: Inez Catalina M.D.   On: 09/15/2019 18:55   VAS [place] LOWER EXTREMITY VENOUS (DVT) (ONLY [facility] & [facility])  Result Date: 09/15/2019  Lower Venous Study Indications: Pain and swelling in right calf for several days.  Anticoagulation: Xarelto. Limitations: Body habitus. Performing Technologist: Oda Cogan RDMS, RVT  Examination Guidelines: A complete evaluation includes B-mode imaging, spectral Doppler, color Doppler, and power Doppler as needed of all accessible portions of each vessel. Bilateral testing is considered an integral part of a complete examination. Limited examinations for reoccurring indications may be performed as noted.  +---------+---------------+---------+-----------+----------+------------------+ RIGHT    CompressibilityPhasicitySpontaneityPropertiesThrombus Aging     +---------+---------------+---------+-----------+----------+------------------+ CFV      Full           Yes      Yes                                     +---------+---------------+---------+-----------+----------+------------------+ SFJ      Full           Yes      Yes                                      +---------+---------------+---------+-----------+----------+------------------+ FV Prox  Full                                                            +---------+---------------+---------+-----------+----------+------------------+ FV Mid   Full                                                            +---------+---------------+---------+-----------+----------+------------------+ FV DistalFull                                                            +---------+---------------+---------+-----------+----------+------------------+  PFV      Full                                                            +---------+---------------+---------+-----------+----------+------------------+ POP      Full           Yes      Yes                                     +---------+---------------+---------+-----------+----------+------------------+ PTV                                                   Patent with color                                                        flow               +---------+---------------+---------+-----------+----------+------------------+ PERO                                                  Patent with color                                                        flow               +---------+---------------+---------+-----------+----------+------------------+ No evidence of obvious DVT in the calf veins by color flow imaging.  +----+---------------+---------+-----------+----------+--------------+ LEFTCompressibilityPhasicitySpontaneityPropertiesThrombus Aging +----+---------------+---------+-----------+----------+--------------+ CFV Full           Yes      Yes                                 +----+---------------+---------+-----------+----------+--------------+ SFJ Full                                                         +----+---------------+---------+-----------+----------+--------------+     Summary: Right: There is no evidence of deep vein thrombosis in the lower extremity. No cystic structure found in the popliteal fossa. Left: No evidence of common femoral vein obstruction.  *See table(s) above for measurements and observations. Electronically signed by Harold Barban MD on 09/15/2019 at 1:43:28 PM.    Final    Procedures  MDM   16:30 took over patient's care at shift change pending lab work and CT angio of the right lower extremity given concern for arterial occlusion.  On exam patient with  significantly diminished pulses in the right leg compared to the left with swelling and pain in the calf.  There is swelling noted but exam is not consistent with compartment syndrome.  CBC without evidence of leukocytosis and exam is not suggestive of cellulitis.  I-STAT Chem-8 shows normal creatinine and no significant electrolyte derangements.  Lactic acid is not elevated.  Awaiting CT results.  19:00 Right lower extremity CT angio returned and does not suggest acute arterial occlusion but does show a focal hematoma within the deep musculature of the right calf, likely in the setting of patient's blood thinner use.  Patient denies any trauma this is consistent with the area of patient's pain.  Despite reassuring vascular runoff on study patient does have significantly decreased pulse on the side will discuss with vascular surgery.  19:15 Case discussed with Dr. Trula Slade with vascular surgery who will review patient's CT and come in to evaluate the patient  20:00 Dr. Trula Slade with vascular surgery has seen and evaluated the patient, does not feel that patient has an acute compartment syndrome but does recommend admission for monitoring, and holding patient's Xarelto.  Given that patient has multiple other medical problems and will not be going acutely to the OR, requests medicine admission, consult placed.  Case discussed with  Dr. Alcario Drought, who will see and admit the patient.  Final diagnoses:  Leg hematoma, right, initial encounter  On anticoagulant therapy      Janet Berlin 09/15/19 2059    Isla Pence, MD 09/15/19 2134

## 2019-09-15 NOTE — ED Notes (Signed)
Lactic not collected. Unable to obtain sample from midline

## 2019-09-16 DIAGNOSIS — N400 Enlarged prostate without lower urinary tract symptoms: Secondary | ICD-10-CM | POA: Diagnosis present

## 2019-09-16 DIAGNOSIS — G4733 Obstructive sleep apnea (adult) (pediatric): Secondary | ICD-10-CM | POA: Diagnosis not present

## 2019-09-16 DIAGNOSIS — S8010XA Contusion of unspecified lower leg, initial encounter: Secondary | ICD-10-CM | POA: Diagnosis present

## 2019-09-16 DIAGNOSIS — K4020 Bilateral inguinal hernia, without obstruction or gangrene, not specified as recurrent: Secondary | ICD-10-CM | POA: Diagnosis present

## 2019-09-16 DIAGNOSIS — I2510 Atherosclerotic heart disease of native coronary artery without angina pectoris: Secondary | ICD-10-CM

## 2019-09-16 DIAGNOSIS — M549 Dorsalgia, unspecified: Secondary | ICD-10-CM | POA: Diagnosis present

## 2019-09-16 DIAGNOSIS — I5032 Chronic diastolic (congestive) heart failure: Secondary | ICD-10-CM | POA: Diagnosis present

## 2019-09-16 DIAGNOSIS — I480 Paroxysmal atrial fibrillation: Secondary | ICD-10-CM | POA: Diagnosis not present

## 2019-09-16 DIAGNOSIS — E7800 Pure hypercholesterolemia, unspecified: Secondary | ICD-10-CM | POA: Diagnosis present

## 2019-09-16 DIAGNOSIS — I110 Hypertensive heart disease with heart failure: Secondary | ICD-10-CM | POA: Diagnosis present

## 2019-09-16 DIAGNOSIS — Z20822 Contact with and (suspected) exposure to covid-19: Secondary | ICD-10-CM | POA: Diagnosis present

## 2019-09-16 DIAGNOSIS — M1990 Unspecified osteoarthritis, unspecified site: Secondary | ICD-10-CM | POA: Diagnosis present

## 2019-09-16 DIAGNOSIS — E785 Hyperlipidemia, unspecified: Secondary | ICD-10-CM | POA: Diagnosis not present

## 2019-09-16 DIAGNOSIS — X58XXXA Exposure to other specified factors, initial encounter: Secondary | ICD-10-CM | POA: Diagnosis present

## 2019-09-16 DIAGNOSIS — S8011XA Contusion of right lower leg, initial encounter: Secondary | ICD-10-CM | POA: Diagnosis not present

## 2019-09-16 DIAGNOSIS — Z7901 Long term (current) use of anticoagulants: Secondary | ICD-10-CM | POA: Diagnosis not present

## 2019-09-16 DIAGNOSIS — E1140 Type 2 diabetes mellitus with diabetic neuropathy, unspecified: Secondary | ICD-10-CM | POA: Diagnosis present

## 2019-09-16 DIAGNOSIS — Z5309 Procedure and treatment not carried out because of other contraindication: Secondary | ICD-10-CM

## 2019-09-16 DIAGNOSIS — G8929 Other chronic pain: Secondary | ICD-10-CM | POA: Diagnosis present

## 2019-09-16 DIAGNOSIS — E782 Mixed hyperlipidemia: Secondary | ICD-10-CM | POA: Diagnosis not present

## 2019-09-16 DIAGNOSIS — Z96612 Presence of left artificial shoulder joint: Secondary | ICD-10-CM | POA: Diagnosis present

## 2019-09-16 DIAGNOSIS — Z87442 Personal history of urinary calculi: Secondary | ICD-10-CM | POA: Diagnosis not present

## 2019-09-16 DIAGNOSIS — H409 Unspecified glaucoma: Secondary | ICD-10-CM | POA: Diagnosis present

## 2019-09-16 DIAGNOSIS — K2970 Gastritis, unspecified, without bleeding: Secondary | ICD-10-CM | POA: Diagnosis present

## 2019-09-16 DIAGNOSIS — Z96653 Presence of artificial knee joint, bilateral: Secondary | ICD-10-CM | POA: Diagnosis present

## 2019-09-16 DIAGNOSIS — E1151 Type 2 diabetes mellitus with diabetic peripheral angiopathy without gangrene: Secondary | ICD-10-CM | POA: Diagnosis present

## 2019-09-16 DIAGNOSIS — G2581 Restless legs syndrome: Secondary | ICD-10-CM | POA: Diagnosis present

## 2019-09-16 DIAGNOSIS — K219 Gastro-esophageal reflux disease without esophagitis: Secondary | ICD-10-CM | POA: Diagnosis present

## 2019-09-16 DIAGNOSIS — M79604 Pain in right leg: Secondary | ICD-10-CM | POA: Diagnosis not present

## 2019-09-16 DIAGNOSIS — K5790 Diverticulosis of intestine, part unspecified, without perforation or abscess without bleeding: Secondary | ICD-10-CM | POA: Diagnosis present

## 2019-09-16 LAB — CBC
HCT: 37.3 % — ABNORMAL LOW (ref 39.0–52.0)
Hemoglobin: 12.1 g/dL — ABNORMAL LOW (ref 13.0–17.0)
MCH: 28.7 pg (ref 26.0–34.0)
MCHC: 32.4 g/dL (ref 30.0–36.0)
MCV: 88.6 fL (ref 80.0–100.0)
Platelets: 176 10*3/uL (ref 150–400)
RBC: 4.21 MIL/uL — ABNORMAL LOW (ref 4.22–5.81)
RDW: 14 % (ref 11.5–15.5)
WBC: 8.1 10*3/uL (ref 4.0–10.5)
nRBC: 0 % (ref 0.0–0.2)

## 2019-09-16 LAB — GLUCOSE, CAPILLARY
Glucose-Capillary: 162 mg/dL — ABNORMAL HIGH (ref 70–99)
Glucose-Capillary: 188 mg/dL — ABNORMAL HIGH (ref 70–99)

## 2019-09-16 LAB — BASIC METABOLIC PANEL
Anion gap: 11 (ref 5–15)
BUN: 10 mg/dL (ref 8–23)
CO2: 24 mmol/L (ref 22–32)
Calcium: 8.6 mg/dL — ABNORMAL LOW (ref 8.9–10.3)
Chloride: 106 mmol/L (ref 98–111)
Creatinine, Ser: 0.91 mg/dL (ref 0.61–1.24)
GFR calc Af Amer: >60 mL/min (ref >60)
GFR calc non Af Amer: >60 mL/min (ref >60)
Glucose, Bld: 196 mg/dL — ABNORMAL HIGH (ref 70–99)
Potassium: 4.2 mmol/L (ref 3.5–5.1)
Sodium: 141 mmol/L (ref 135–145)

## 2019-09-16 LAB — SARS CORONAVIRUS 2 (TAT 6-24 HRS): SARS Coronavirus 2: NEGATIVE

## 2019-09-16 LAB — CBG MONITORING, ED: Glucose-Capillary: 200 mg/dL — ABNORMAL HIGH (ref 70–99)

## 2019-09-16 MED ORDER — HYDROCODONE-ACETAMINOPHEN 5-325 MG PO TABS
1.0000 | ORAL_TABLET | Freq: Four times a day (QID) | ORAL | Status: DC | PRN
  Administered 2019-09-16 – 2019-09-17 (×4): 2 via ORAL
  Filled 2019-09-16 (×4): qty 2

## 2019-09-16 NOTE — ED Notes (Addendum)
Report given to Benjie Karvonen, RN on floor

## 2019-09-16 NOTE — Consult Note (Signed)
Cardiology Consultation:   Patient ID: Brian Ellison MRN: [id number]; DOB: 07/24/1941  Admit date: 09/15/2019 Date of Consult: 09/16/2019  Primary Care Provider: [facility], [facility] Primary Cardiologist: Fransico Him, MD   Patient Profile:   Brian Ellison is a 79 y.o. male with a hx of paroxysmal atrial fibrillation on chronic anticoagulation with Xarelto who is being seen today for the evaluation of calf hematoma and necessity for anticoagulation is now at the request of Brabham, MD.  History of Present Illness:   Brian Ellison is a48yo male with a history of DM, OSA and hyperlipidemia who was admitted to [facility] 10/04/2018 with complaints of CP.Chest CT neg for PE but showed mild 3v CAD. He was dx with LLL PNA by chest CT treated with antibx. He then developed chills, rigors and fever and developed afib with RVR treated with Lopressor 12.5mg  BID. Echo showed normal LVF with no signficant valvular heart dz. There was mild LAE. TSH was normal. He was started on Xarelto 20mg  daily for CHADS2VAS score of 3. He converted back to NSR the next day.  He was seen back 10/2018 and was back in atrial flutter and was referred to afib clinic. He did not have any further CP which was felt to be related to PNA in hospital. In afib clinic, therapeutic options including Tikosyn, amiodarone, vs rate control. He didnot want Tikosyn because his wife had an episode of torsades de pointes while being loaded on the medication. He wanted totake some time to consider amiodarone after discussing risks and benefits of the medication.   He later decided to start on Amio and was loaded on 200mg  BID and converted to NSR at next afib clinic. His Amio was decreased to 200mg  daily. Due to ongoing DOE ,Nuclear stress test was done which showed no ischemia.   The patient presented to the ER yesterday with right calf hematoma in the setting of anticoagulation: The biggest concern I have is for compartment  syndrome however at this point, I feel his compartments are soft he just has tenderness due to the fullness in the calf.  He has motor and sensory function intact in the right leg as well as normal Doppler signals.  His pain has been consistent for greater than 48 hours.  The patient does not recall any injury to his leg.  He was seen by Dr. Trula Slade in the ER yesterday they discussed the possible pellety of fasciotomy and decompression.  His Xarelto was held, his last dose taken was Monday morning January 4.  Today he states that he has been otherwise completely stable, he denies any recent chest pain shortness of breath.  He has not been having any lower extremity edema other than 1 related to hematoma right now.  He was not aware of being in A. fib back in January and February but denies any palpitations then or since then.  He has been active and asymptomatic.  Heart Pathway Score:     Past Medical History:  Diagnosis Date  . Allergic rhinitis   . Arthritis    SPINAL AND JOINTS  . Bladder polyp   . BPH (benign prostatic hypertrophy)   . Chronic back pain   . Diverticulosis   . Dyslipidemia    per pt questionable  . ED (erectile dysfunction) of organic origin   . GERD (gastroesophageal reflux disease)   . Glaucoma    BOTH EYES  . History of colon polyps   . History of gastritis   .  History of kidney stones    many yrs ago  . 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 History of shingles   . Lower urinary tract symptoms (LUTS)   . OSA on CPAP    very severe per study 01-31-2006  . Peyronie's disease   . S/P insertion of spinal cord stimulator    2010  . Tingling    right arm;takes Gabapentin  . Type 2 diabetes mellitus ([place])     Past Surgical History:  Procedure Laterality Date  . ANTERIOR CERVICAL DECOMP/DISCECTOMY FUSION  06-16-2004   C3 -- C5  . APPENDECTOMY  age 35  . CATARACT EXTRACTION W/ INTRAOCULAR LENS  IMPLANT,  BILATERAL    . COLONOSCOPY    . CYSTOSCOPY WITH BIOPSY N/A 07/15/2015   Procedure: CYSTOSCOPY WITH BLADDER AND PROSTATE BIOPSY;  Surgeon: Festus Aloe, MD;  Location[facility];  Service: Urology;  Laterality: N/A;  . HAND SURGERY Right 2017   "right hand middle finger knuckle replaced"  . INGUINAL HERNIA REPAIR Bilateral 1970's  . NASAL SEPTUM SURGERY  yrs ago  . PENILE PROSTHESIS IMPLANT  11-26-2007  . PERMANANT SPINAL CORD STIMULATOR IMPLANT VIA THORACIC LAMINECTOMY  05-12-2009   Medtronic generator (left buttock)  . POSTERIOR LAMINECTOMY / DECOMPRESSION CERVICAL SPINE  03-08-2005   C3  -- C6  . RETINAL DETACHMENT SURGERY Right   . REVERSE SHOULDER ARTHROPLASTY Left 09/23/2015   Procedure: LEFT REVERSE TOTAL SHOULDER ARTHROPLASTY;  Surgeon: Netta Cedars, MD;  Location: [facility];  Service: Orthopedics;  Laterality: Left;  . REVISION TOTAL ARTHROPLASTY RIGHT SHOULDER  07-22-2009  . RIGHT HEEL SURGERY     spurs  . RIGHT SHOULDER HEMIARTHROPLASTY  08-20-2008  . ROTATOR CUFF REPAIR Bilateral x1 right/  x2  left - last one 2013  . SHOULDER OPEN ROTATOR CUFF REPAIR Bilateral   . SPINAL CORD STIMULATOR BATTERY EXCHANGE N/A 07/29/2018   Procedure: Spinal cord stimulator/implantable pulse generator battery change;  Surgeon: Erline Levine, MD;  Location: [place];  Service: Neurosurgery;  Laterality: N/A;  Spinal cord stimulator/implantable pulse generator battery change  . TONSILLECTOMY    . TOTAL KNEE ARTHROPLASTY Right 04/17/2013   Procedure: RIGHT TOTAL KNEE ARTHROPLASTY;  Surgeon: Augustin Schooling, MD;  Location: [place];  Service: Orthopedics;  Laterality: Right;  . TOTAL KNEE ARTHROPLASTY Left 02-10-2009    Inpatient Medications: Scheduled Meds: . amiodarone  200 mg Oral Daily  . atorvastatin  10 mg Oral Daily  . brimonidine  1 drop Both Eyes BID  . fluticasone  1 spray Each Nare QHS  . furosemide  20 mg Oral QODAY  . insulin aspart  0-15 Units Subcutaneous TID WC  .  insulin aspart  0-5 Units Subcutaneous QHS  . metoprolol succinate  25 mg Oral Daily  . pantoprazole  40 mg Oral Daily  . potassium chloride  10 mEq Oral Daily  . rOPINIRole  1 mg Oral QHS  . sodium chloride flush  10-40 mL Intracatheter Q12H   Continuous Infusions:  PRN Meds: acetaminophen **OR** acetaminophen, HYDROcodone-acetaminophen, ondansetron **OR** ondansetron (ZOFRAN) IV, sodium chloride flush  Allergies:   No Known Allergies  Social History:   Social History   Socioeconomic History  . Marital status: Married    Spouse name: Not on file  . Number of children: 3  . Years of education: 11  . Highest education level: Not on file  Occupational History  . Not  on file  Tobacco Use  . Smoking status: Former Smoker    Packs/day: 1.00    Years: 15.00    Pack years: 15.00    Types: Cigarettes    Quit date: 07/08/1971    Years since quitting: 48.2  . Smokeless tobacco: Never Used  Substance and Sexual Activity  . Alcohol use: Yes    Comment: rare  . Drug use: No  . Sexual activity: Not on file  Other Topics Concern  . Not on file  Social History Narrative  . Not on file   Social Determinants of Health   Financial Resource Strain: Low Risk   . Difficulty of Paying Living Expenses: Not hard at all  Food Insecurity: No Food Insecurity  . Worried About Charity fundraiser in the Last Year: Never true  . Ran Out of Food in the Last Year: Never true  Transportation Needs: No Transportation Needs  . Lack of Transportation (Medical): No  . Lack of Transportation (Non-Medical): No  Physical Activity: Inactive  . Days of Exercise per Week: 0 days  . Minutes of Exercise per Session: 0 min  Stress: No Stress Concern Present  . Feeling of Stress : Only a little  Social Connections: Slightly Isolated  . Frequency of Communication with Friends and Family: Three times a week  . Frequency of Social Gatherings with Friends and Family: Once a week  . Attends Religious  Services: More than 4 times per year  . Active Member of Clubs or Organizations: No  . Attends Archivist Meetings: Never  . Marital Status: Married  Human resources officer Violence: Not At Risk  . Fear of Current or Ex-Partner: No  . Emotionally Abused: No  . Physically Abused: No  . Sexually Abused: No    Family History:   No family history on file.   ROS:  Please see the history of present illness.  All other ROS reviewed and negative.     Physical Exam/Data:   Vitals:   09/15/19 2230 09/16/19 0411 09/16/19 1436 09/16/19 1455  BP: (!) 141/73 (!) 162/75 113/68 (!) 123/105  Pulse: 78 82 70 76  Resp:  17 18 20   Temp:    98.2 F (36.8 C)  TempSrc:    Oral  SpO2: 92% 95% 96% 97%    Intake/Output Summary (Last 24 hours) at 09/16/2019 1607 Last data filed at 09/16/2019 1500 Gross per 24 hour  Intake --  Output 100 ml  Net -100 ml   Last 3 Weights 08/12/2019 04/03/2019 02/09/2019  Weight (lbs) 250 lb 250 lb 239 lb  Weight (kg) 113.399 kg 113.399 kg 108.41 kg     There is no height or weight on file to calculate BMI.  General:  Well nourished, well developed, in no acute distress HEENT: normal Lymph: no adenopathy Neck: no JVD Endocrine:  No thryomegaly Vascular: No carotid bruits; FA pulses 2+ bilaterally without bruits  Cardiac:  normal S1, S2; RRR; no murmur  Lungs:  clear to auscultation bilaterally, no wheezing, rhonchi or rales  Abd: soft, nontender, no hepatomegaly  Ext: no edema in his left lower extremity, his right thigh appears swollen and tender.  Patient has an elevated on multiple towels. Musculoskeletal:  No deformities, BUE and BLE strength normal and equal Skin: warm and dry  Neuro:  CNs 2-12 intact, no focal abnormalities noted Psych:  Normal affect   EKG:  The EKG was personally reviewed and demonstrates: Sinus rhythm, nonspecific ST-T wave abnormalities, unchanged  from prior. Telemetry:  Telemetry was personally reviewed and demonstrates: Sinus  rhythm  Relevant CV Studies: Echocardiogram October 05, 2018  - Left ventricle: The cavity size was normal. There was moderate   concentric hypertrophy. Systolic function was normal. The   estimated ejection fraction was in the range of 60% to 65%. Wall   motion was normal; there were no regional wall motion   abnormalities. Findings consistent with left ventricular   diastolic dysfunction, grade indeterminate. - Aortic valve: Mildly calcified annulus. Trileaflet; mildly   thickened, mildly calcified leaflets. Sclerosis without stenosis. - Aorta: Ascending aortic diameter: 39 mm (S). - Mitral valve: Mildly calcified annulus. - Left atrium: The atrium was mildly dilated. - Systemic veins: IVC is dilated with normal respiratory variation.   Estimated RAP: 8 mmHg.  Laboratory Data:  High Sensitivity Troponin:  No results for input(s): TROPONINIHS in the last 720 hours.   Chemistry Recent Labs  Lab 09/15/19 1643 09/15/19 1900 09/16/19 0430  NA 142 143 141  K 4.0 4.4 4.2  CL 105 107 106  CO2  --  27 24  GLUCOSE 151* 143* 196*  BUN 12 10 10   CREATININE 0.80 0.88 0.91  CALCIUM  --  8.7* 8.6*  GFRNONAA  --  >60 >60  GFRAA  --  >60 >60  ANIONGAP  --  9 11    No results for input(s): PROT, ALBUMIN, AST, ALT, ALKPHOS, BILITOT in the last 168 hours. Hematology Recent Labs  Lab 09/15/19 1410 09/15/19 1643 09/16/19 0430  WBC 8.7  --  8.1  RBC 4.62  --  4.21*  HGB 13.0 12.6* 12.1*  HCT 41.9 37.0* 37.3*  MCV 90.7  --  88.6  MCH 28.1  --  28.7  MCHC 31.0  --  32.4  RDW 14.0  --  14.0  PLT 175  --  176   BNPNo results for input(s): BNP, PROBNP in the last 168 hours.  DDimer No results for input(s): DDIMER in the last 168 hours.   Radiology/Studies:  CT ANGIO LOW EXTREM RIGHT W &/OR WO CONTRAST  Result Date: 09/15/2019 CLINICAL DATA:  Right lower extremity pain and swelling, initial encounter EXAM: CT ANGIOGRAPHY OF THE RIGHT LOWEREXTREMITY TECHNIQUE: Multidetector CT  imaging of the right lower extremitywas performed using the standard protocol during bolus administration of intravenous contrast. Multiplanar CT image reconstructions and MIPs were obtained to evaluate the vascular anatomy. CONTRAST:  156mL OMNIPAQUE IOHEXOL 350 MG/ML SOLN COMPARISON:  None. FINDINGS: Vascular: Distal abdominal aorta demonstrates atherosclerotic calcifications although no aneurysmal dilatation is seen. Visualized portions of the left iliac system are within normal limits. Right common, external and internal iliac arteries are widely patent with mild atherosclerotic calcifications. No filling defects or focal stenoses are seen. Right common femoral artery and femoral bifurcation are within normal limits. The superficial femoral artery demonstrates mild atherosclerotic calcifications without aneurysmal dilatation or focal stenosis. The visualized portions of the popliteal artery are widely patent although the midportion is obscured due to scatter artifact from the patient's known knee replacement. Right popliteal trifurcation shows patency of the vessels with evidence of mild atherosclerotic calcifications. The vessels appear patent to the level of the ankle with the anterior tibial and posterior tibial arteries continuing into the foot. No focal high-grade stenosis or filling defect to suggest embolism is seen. No venous abnormality is noted. Nonvascular: In the posterior musculature of the right calf there is a 3.0 x 3.1 x 6.4 cm mildly hyperdense area with a fluid-fluid level  within. This likely represents a focal muscular hematoma just deep to the soleus muscle within the flexor muscles of the calf. Patient gives a history of Xarelto use. Correlation with any recent trauma is recommended. The remainder of the muscular structures of the lower extremity on the right are noted. 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 The visualized portions of the  bladder and bowel are unremarkable. Degenerative changes of lumbar spine are seen. No lower extremity bony abnormality is noted. Review of the MIP images confirms the above findings. IMPRESSION: Changes consistent with focal hematoma within the deep musculature of the right calf as described. This is likely related to the patient's known blood thinner use. No evidence of arterial emboli or occlusive changes are seen. Normal three-vessel runoff to the ankle on the right is noted. Electronically Signed   By: Inez Catalina M.D.   On: 09/15/2019 18:55   VAS [place] LOWER EXTREMITY VENOUS (DVT) (ONLY [facility] & [facility])  Result Date: 09/15/2019  Lower Venous Study Indications: Pain and swelling in right calf for several days.  Anticoagulation: Xarelto. Limitations: Body habitus. Performing Technologist: Oda Cogan RDMS, RVT  Examination Guidelines: A complete evaluation includes B-mode imaging, spectral Doppler, color Doppler, and power Doppler as needed of all accessible portions of each vessel. Bilateral testing is considered an integral part of a complete examination. Limited examinations for reoccurring indications may be performed as noted.  +---------+---------------+---------+-----------+----------+------------------+ RIGHT    CompressibilityPhasicitySpontaneityPropertiesThrombus Aging     +---------+---------------+---------+-----------+----------+------------------+ CFV      Full           Yes      Yes                                     +---------+---------------+---------+-----------+----------+------------------+ SFJ      Full           Yes      Yes                                     +---------+---------------+---------+-----------+----------+------------------+ FV Prox  Full                                                            +---------+---------------+---------+-----------+----------+------------------+ FV Mid   Full                                                             +---------+---------------+---------+-----------+----------+------------------+ FV DistalFull                                                            +---------+---------------+---------+-----------+----------+------------------+ PFV      Full                                                            +---------+---------------+---------+-----------+----------+------------------+  POP      Full           Yes      Yes                                     +---------+---------------+---------+-----------+----------+------------------+ PTV                                                   Patent with color                                                        flow               +---------+---------------+---------+-----------+----------+------------------+ PERO                                                  Patent with color                                                        flow               +---------+---------------+---------+-----------+----------+------------------+ No evidence of obvious DVT in the calf veins by color flow imaging.  +----+---------------+---------+-----------+----------+--------------+ LEFTCompressibilityPhasicitySpontaneityPropertiesThrombus Aging +----+---------------+---------+-----------+----------+--------------+ CFV Full           Yes      Yes                                 +----+---------------+---------+-----------+----------+--------------+ SFJ Full                                                        +----+---------------+---------+-----------+----------+--------------+     Summary: Right: There is no evidence of deep vein thrombosis in the lower extremity. No cystic structure found in the popliteal fossa. Left: No evidence of common femoral vein obstruction.  *See table(s) above for measurements and observations. Electronically signed by Harold Barban MD on 09/15/2019 at 1:43:28 PM.    Final      Assessment and Plan:   Right calf hematoma, confirmed on CT, followed by Dr. Trula Slade, possible necessity for fasciotomy and surgical decompression. -We will continue to hold Xarelto, no need for bridging with heparin.  Paroxysmal atrial fibrillation -denies any palpitations and thinks he has been in NSR -Patient had 2 documented episodes of atrial fibrillation in the settings of pneumonia, in the settings of significant hematoma without any precipitating factor I would hold off Xarelto from now on no need for bridging, I would continue holding on discharge and perform 2-week event monitor, and if he does not have  any episodes of atrial fibrillation I would not restart Xarelto right now -continue Amio 200mg  daily, Toprol XL 25mg  daily and Xarelto 20mg  daily -he has not had any bleeding problems on DOAC -Hemoglobin is 12.1, platelets 176 -TSH 2.5 -check PFTs with DLCO for Amio  Coronary artery calcifications  -no ischemia on nuclear stress test 11/2018 -denies any CP -We will restart aspirin 81 mg daily at the discharge given the fact that he is not on anticoagulation anymore.    Hyperlipidemia  -LDL goal <70 due to coronary artery calcifications.  -LDL was 46 in July -continue Atorvastatin 10mg  daily  OSA -On CPAP  For questions or updates, please contact [facility] Please consult [email or web address] for contact info under   Signed, Ena Dawley, MD  09/16/2019 4:07 PM

## 2019-09-16 NOTE — Progress Notes (Signed)
[facility]  PROGRESS NOTE  Brian Ellison [id number] DOB: [birth year] DOA: 09/15/2019 PCP: [facility] Admit date - 09/15/2019   Admitting Physician Etta Quill, DO  Outpatient Primary MD for the patient is [facility]  LOS - 1 Brief Narrative   Brian Ellison is a 79 y.o. year old male with medical history significant for paroxysmal atrial fibrillation on Xarelto, type 2 diabetes who presented on 09/15/2019 with right l calf pain and swelling x2 days without any noted trauma and was found to have right lower leg hematoma on CTA.  Patient was admitted initially to monitor for signs and symptoms consistent with compartment syndrome.  Though clinically symptoms seem to be improved, patient is still at high risk for developing compartment syndrome and will require inpatient stay for continued close monitoring.  Cardiology was consulted on 1/6 to evaluate necessity for anticoagulation   Subjective  Feels right calf pain is improved with elevation, no cough, no chest pain, no shortness of breath  A & P  Right calf hematoma, in setting of Xarelto Currently no signs of compartment syndrome, neurologically intact, with normal Doppler signals -Vascular surgery managing nonoperatively, but still at high risk for possible surgical decompression continue right leg elevation -Continue to hold Xarelto(last dose 1/4) on discharge, no need for bridging, per cardiology recommendations -2-week event monitor on discharge, per cardiology  Proximal atrial fibrillation, rate controlled currently -Continue amiodarone, Toprol per cardiology recommendations  Known coronary artery calcifications -Asymptomatic -Cardiology recommends restarting aspirin 81 mg on discharge if patient is no longer on chronic anticoagulation  Hyperlipidemia, stable -Continue atorvastatin graft   OSA, stable -CPAP  Type 2 diabetes A1c 8.3-hold home Metformin -Monitor CBG, sliding scale as  needed  Chronic diastolic CHF, stable -Lasix every other day, continue home regimen  Hypertension, stable -Continue Toprol-XL  GERD, stable -Continue Protonix  Restless leg syndrome -Continue Requip    Family Communication  : None at bedside  Code Status : Full  Disposition Plan  : Needs continued medical care will change to inpatient, needs close monitoring of right calf given significant hematoma in the setting of anticoagulation to ensure no development of compartment syndrome that would require surgical decompression  Consults  : Vascular, cardiology  Procedures  : None  DVT Prophylaxis  : SCDs  Lab Results  Component Value Date   PLT 176 09/16/2019    Diet :  Diet Order            Diet Carb Modified Fluid consistency: Thin; Room service appropriate? Yes  Diet effective now               Inpatient Medications Scheduled Meds:  amiodarone  200 mg Oral Daily   atorvastatin  10 mg Oral Daily   brimonidine  1 drop Both Eyes BID   fluticasone  1 spray Each Nare QHS   furosemide  20 mg Oral QODAY   insulin aspart  0-15 Units Subcutaneous TID WC   insulin aspart  0-5 Units Subcutaneous QHS   metoprolol succinate  25 mg Oral Daily   pantoprazole  40 mg Oral Daily   potassium chloride  10 mEq Oral Daily   rOPINIRole  1 mg Oral QHS   sodium chloride flush  10-40 mL Intracatheter Q12H   Continuous Infusions: PRN Meds:.acetaminophen **OR** acetaminophen, HYDROcodone-acetaminophen, ondansetron **OR** ondansetron (ZOFRAN) IV, sodium chloride flush  Antibiotics  :   Anti-infectives (From admission, onward)   None  Objective   Vitals:   09/15/19 2230 09/16/19 0411 09/16/19 1436 09/16/19 1455  BP: (!) 141/73 (!) 162/75 113/68 (!) 123/105  Pulse: 78 82 70 76  Resp:  17 18 20   Temp:    98.2 F (36.8 C)  TempSrc:    Oral  SpO2: 92% 95% 96% 97%    SpO2: 97 %  Wt Readings from Last 3 Encounters:  08/12/19 113.4 kg  04/03/19 113.4 kg   02/09/19 108.4 kg     Intake/Output Summary (Last 24 hours) at 09/16/2019 1839 Last data filed at 09/16/2019 1815 Gross per 24 hour  Intake --  Output 300 ml  Net -300 ml    Physical Exam:  Awake Alert, Oriented X 3, Normal affect No new F.N deficits,  [place].AT, No JVD Symmetrical Chest wall movement, Good air movement bilaterally, CTAB RRR,No Gallops,Rubs or new Murmurs,  Right calf, taut skin, no erythema, no tenderness to touch, warm to touch   I have personally reviewed the following:   Data Reviewed:  CBC Recent Labs  Lab 09/15/19 1410 09/15/19 1643 09/16/19 0430  WBC 8.7  --  8.1  HGB 13.0 12.6* 12.1*  HCT 41.9 37.0* 37.3*  PLT 175  --  176  MCV 90.7  --  88.6  MCH 28.1  --  28.7  MCHC 31.0  --  32.4  RDW 14.0  --  14.0  LYMPHSABS 1.7  --   --   MONOABS 0.8  --   --   EOSABS 0.1  --   --   BASOSABS 0.0  --   --     Chemistries  Recent Labs  Lab 09/15/19 1643 09/15/19 1900 09/16/19 0430  NA 142 143 141  K 4.0 4.4 4.2  CL 105 107 106  CO2  --  27 24  GLUCOSE 151* 143* 196*  BUN 12 10 10   CREATININE 0.80 0.88 0.91  CALCIUM  --  8.7* 8.6*   ------------------------------------------------------------------------------------------------------------------ No results for input(s): CHOL, HDL, LDLCALC, TRIG, CHOLHDL, LDLDIRECT in the last 72 hours.  Lab Results  Component Value Date   HGBA1C 8.3 (H) 09/15/2019   ------------------------------------------------------------------------------------------------------------------ No results for input(s): TSH, T4TOTAL, T3FREE, THYROIDAB in the last 72 hours.  Invalid input(s): FREET3 ------------------------------------------------------------------------------------------------------------------ No results for input(s): VITAMINB12, FOLATE, FERRITIN, TIBC, IRON, RETICCTPCT in the last 72 hours.  Coagulation profile No results for input(s): INR, PROTIME in the last 168 hours.  No results for input(s):  DDIMER in the last 72 hours.  Cardiac Enzymes No results for input(s): CKMB, TROPONINI, MYOGLOBIN in the last 168 hours.  Invalid input(s): CK ------------------------------------------------------------------------------------------------------------------    Component Value Date/Time   BNP 333.0 (H) 11/12/2018 1045    Micro Results Recent Results (from the past 240 hour(s))  SARS CORONAVIRUS 2 (TAT 6-24 HRS) Nasopharyngeal Nasopharyngeal Swab     Status: None   Collection Time: 09/15/19  8:55 PM   Specimen: Nasopharyngeal Swab  Result Value Ref Range Status   SARS Coronavirus 2 NEGATIVE NEGATIVE Final    Comment: (NOTE) SARS-CoV-2 target nucleic acids are NOT DETECTED. The SARS-CoV-2 RNA is generally detectable in upper and lower respiratory specimens during the acute phase of infection. Negative results do not preclude SARS-CoV-2 infection, do not rule out co-infections with other pathogens, and should not be used as the sole basis for treatment or other patient management decisions. Negative results must be combined with clinical observations, patient history, and epidemiological information. The expected result is Negative. Fact Sheet for Patients: SugarRoll.be  Fact Sheet for Healthcare Providers: https://www.woods-mathews.com/ This test is not yet approved or cleared by the [place] FDA and  has been authorized for detection and/or diagnosis of SARS-CoV-2 by FDA under an Emergency Use Authorization (EUA). This EUA will remain  in effect (meaning this test can be used) for the duration of the COVID-19 declaration under Section 56 4(b)(1) of the Act, 21 U.S.C. section 360bbb-3(b)(1), unless the authorization is terminated or revoked sooner. Performed at [facility], [place] [address] [place],  [address]     Radiology Reports CT ANGIO LOW EXTREM RIGHT W &/OR WO CONTRAST  Result Date: 09/15/2019 CLINICAL DATA:   Right lower extremity pain and swelling, initial encounter EXAM: CT ANGIOGRAPHY OF THE RIGHT LOWEREXTREMITY TECHNIQUE: Multidetector CT imaging of the right lower extremitywas performed using the standard protocol during bolus administration of intravenous contrast. Multiplanar CT image reconstructions and MIPs were obtained to evaluate the vascular anatomy. CONTRAST:  143mL OMNIPAQUE IOHEXOL 350 MG/ML SOLN COMPARISON:  None. FINDINGS: Vascular: Distal abdominal aorta demonstrates atherosclerotic calcifications although no aneurysmal dilatation is seen. Visualized portions of the left iliac system are within normal limits. Right common, external and internal iliac arteries are widely patent with mild atherosclerotic calcifications. No filling defects or focal stenoses are seen. Right common femoral artery and femoral bifurcation are within normal limits. The superficial femoral artery demonstrates mild atherosclerotic calcifications without aneurysmal dilatation or focal stenosis. The visualized portions of the popliteal artery are widely patent although the midportion is obscured due to scatter artifact from the patient's known knee replacement. Right popliteal trifurcation shows patency of the vessels with evidence of mild atherosclerotic calcifications. The vessels appear patent to the level of the ankle with the anterior tibial and posterior tibial arteries continuing into the foot. No focal high-grade stenosis or filling defect to suggest embolism is seen. No venous abnormality is noted. Nonvascular: In the posterior musculature of the right calf there is a 3.0 x 3.1 x 6.4 cm mildly hyperdense area with a fluid-fluid level within. This likely represents a focal muscular hematoma just deep to the soleus muscle within the flexor muscles of the calf. Patient gives a history of Xarelto use. Correlation with any recent trauma is recommended. The remainder of the muscular structures of the lower extremity on the  right are noted. 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 The visualized portions of the bladder and bowel are unremarkable. Degenerative changes of lumbar spine are seen. No lower extremity bony abnormality is noted. Review of the MIP images confirms the above findings. IMPRESSION: Changes consistent with focal hematoma within the deep musculature of the right calf as described. This is likely related to the patient's known blood thinner use. No evidence of arterial emboli or occlusive changes are seen. Normal three-vessel runoff to the ankle on the right is noted. Electronically Signed   By: Inez Catalina M.D.   On: 09/15/2019 18:55   VAS [place] LOWER EXTREMITY VENOUS (DVT) (ONLY [facility] & [facility])  Result Date: 09/15/2019  Lower Venous Study Indications: Pain and swelling in right calf for several days.  Anticoagulation: Xarelto. Limitations: Body habitus. Performing Technologist: Oda Cogan RDMS, RVT  Examination Guidelines: A complete evaluation includes B-mode imaging, spectral Doppler, color Doppler, and power Doppler as needed of all accessible portions of each vessel. Bilateral testing is considered an integral part of a complete examination. Limited examinations for reoccurring indications may be performed as noted.  +---------+---------------+---------+-----------+----------+------------------+  RIGHT  Compressibility Phasicity Spontaneity Properties Thrombus Aging      +---------+---------------+---------+-----------+----------+------------------+  CFV       Full            Yes       Yes                                        +---------+---------------+---------+-----------+----------+------------------+  SFJ       Full            Yes       Yes                                        +---------+---------------+---------+-----------+----------+------------------+  FV Prox   Full                                                                  +---------+---------------+---------+-----------+----------+------------------+  FV Mid    Full                                                                 +---------+---------------+---------+-----------+----------+------------------+  FV Distal Full                                                                 +---------+---------------+---------+-----------+----------+------------------+  PFV       Full                                                                 +---------+---------------+---------+-----------+----------+------------------+  POP       Full            Yes       Yes                                        +---------+---------------+---------+-----------+----------+------------------+  PTV                                                        Patent with color  flow                +---------+---------------+---------+-----------+----------+------------------+  PERO                                                       Patent with color                                                               flow                +---------+---------------+---------+-----------+----------+------------------+ No evidence of obvious DVT in the calf veins by color flow imaging.  +----+---------------+---------+-----------+----------+--------------+  LEFT Compressibility Phasicity Spontaneity Properties Thrombus Aging  +----+---------------+---------+-----------+----------+--------------+  CFV  Full            Yes       Yes                                    +----+---------------+---------+-----------+----------+--------------+  SFJ  Full                                                             +----+---------------+---------+-----------+----------+--------------+     Summary: Right: There is no evidence of deep vein thrombosis in the lower extremity. No cystic structure found in the popliteal fossa. Left: No evidence of common femoral vein  obstruction.  *See table(s) above for measurements and observations. Electronically signed by Harold Barban MD on 09/15/2019 at 1:43:28 PM.    Final      Time Spent in minutes  30     Desiree Hane M.D on 09/16/2019 at 6:39 PM  To page go to [email or web address] - password [email or web address]

## 2019-09-16 NOTE — ED Notes (Signed)
Patient is resting comfortably. 

## 2019-09-16 NOTE — Progress Notes (Signed)
Helped patient get out of bed to chair He reports less leg pain His leg is slightly less edematous and tender.  No evidence of compartment syndrome. Hopefully will be able to avoid surgical decompression I will follow up tomorrow.  Continue to hold anticoagulation.  Annamarie Major

## 2019-09-16 NOTE — ED Notes (Signed)
Vascular at bedside

## 2019-09-16 NOTE — Progress Notes (Signed)
    Subjective  -   Still with right leg pain, but improved with pain meds   Physical Exam:  Normal motor and sensory function in righ leg Calf is less tender and remains soft Brisk doppler signals       Assessment/Plan:    Right calf hematoma:  Calf appears slightly improved from last night.  I do not think he is out of the woods regarding the need for surgical decompression, however since he is improved, I think we can continue to monitor him.  Continue to keep right leg elevated.  Continue to hold anticoagulation.  I have asked cardiology to evaluate him regarding the need to continue anticoagulation.    Wells Arleta Ostrum 09/16/2019 8:34 AM --  Vitals:   09/15/19 2230 09/16/19 0411  BP: (!) 141/73 (!) 162/75  Pulse: 78 82  Resp:  17  Temp:    SpO2: 92% 95%   No intake or output data in the 24 hours ending 09/16/19 0834   Laboratory CBC    Component Value Date/Time   WBC 8.1 09/16/2019 0430   HGB 12.1 (L) 09/16/2019 0430   HGB 14.0 08/20/2019 1502   HCT 37.3 (L) 09/16/2019 0430   HCT 41.5 08/20/2019 1502   PLT 176 09/16/2019 0430   PLT 244 08/20/2019 1502    BMET    Component Value Date/Time   NA 141 09/16/2019 0430   NA 141 06/02/2019 1012   K 4.2 09/16/2019 0430   CL 106 09/16/2019 0430   CO2 24 09/16/2019 0430   GLUCOSE 196 (H) 09/16/2019 0430   BUN 10 09/16/2019 0430   BUN 16 06/02/2019 1012   CREATININE 0.91 09/16/2019 0430   CALCIUM 8.6 (L) 09/16/2019 0430   GFRNONAA >60 09/16/2019 0430   GFRAA >60 09/16/2019 0430    COAG Lab Results  Component Value Date   INR 1.48 04/21/2013   INR 1.39 04/20/2013   INR 1.24 04/19/2013   No results found for: PTT  Antibiotics Anti-infectives (From admission, onward)   None       V. Leia Alf, M.D., [facility] [place] Office: [phone] Pager:  [phone]

## 2019-09-16 NOTE — ED Notes (Signed)
Checked patient cbg it was 200 n9otified RN of blood sugar

## 2019-09-16 NOTE — ED Notes (Signed)
Pt got breakfast tray delivered. RN read note for pt to be NPO after midnight. RN took tray. Pt has eaten half french toast and few bites of oatmeal. RN explained that he needs to wait for vascular first to decide if going to surgery.

## 2019-09-16 NOTE — ED Notes (Signed)
Pt requesting something for pain. Pt stated they gave me tylenol and its not doing anything. MD paged

## 2019-09-16 NOTE — Progress Notes (Signed)
Pt received from ED. Oriented to unit and room. CHG wipes applied. Pt connected to tele box and CCMD called. VSS. Pt denies needs at this time.  Arletta Bale, RN

## 2019-09-16 NOTE — ED Notes (Signed)
Vascular MD came to see pt and feels improvement. Pt can have meal tray at this time. Pt will remain today while being observed. Pt cannot bear weight at this time.

## 2019-09-17 ENCOUNTER — Other Ambulatory Visit: Payer: Self-pay | Admitting: Physician Assistant

## 2019-09-17 ENCOUNTER — Telehealth: Payer: Self-pay | Admitting: *Deleted

## 2019-09-17 DIAGNOSIS — E785 Hyperlipidemia, unspecified: Secondary | ICD-10-CM

## 2019-09-17 DIAGNOSIS — Z7901 Long term (current) use of anticoagulants: Secondary | ICD-10-CM

## 2019-09-17 DIAGNOSIS — I480 Paroxysmal atrial fibrillation: Secondary | ICD-10-CM

## 2019-09-17 LAB — CBC
HCT: 36.3 % — ABNORMAL LOW (ref 39.0–52.0)
Hemoglobin: 11.8 g/dL — ABNORMAL LOW (ref 13.0–17.0)
MCH: 29 pg (ref 26.0–34.0)
MCHC: 32.5 g/dL (ref 30.0–36.0)
MCV: 89.2 fL (ref 80.0–100.0)
Platelets: 177 10*3/uL (ref 150–400)
RBC: 4.07 MIL/uL — ABNORMAL LOW (ref 4.22–5.81)
RDW: 13.7 % (ref 11.5–15.5)
WBC: 7.4 10*3/uL (ref 4.0–10.5)
nRBC: 0 % (ref 0.0–0.2)

## 2019-09-17 LAB — GLUCOSE, CAPILLARY
Glucose-Capillary: 204 mg/dL — ABNORMAL HIGH (ref 70–99)
Glucose-Capillary: 216 mg/dL — ABNORMAL HIGH (ref 70–99)

## 2019-09-17 MED ORDER — ASPIRIN 81 MG PO CHEW
81.0000 mg | CHEWABLE_TABLET | Freq: Once | ORAL | Status: AC
  Administered 2019-09-17: 13:00:00 81 mg via ORAL
  Filled 2019-09-17: qty 1

## 2019-09-17 MED ORDER — ASPIRIN EC 81 MG PO TBEC: 81.0000 mg | DELAYED_RELEASE_TABLET | Freq: Every day | ORAL | 2 refills | Status: AC

## 2019-09-17 NOTE — Progress Notes (Signed)
Patient given discharge instructions ,medication list and prescriptions sent to personal pharmacy. Follow up appointments also given and patient verbalized understanding. IV and tele were dcd. Will discharge home as ordered. Lauralie Blacksher, Bettina Gavia RN

## 2019-09-17 NOTE — Telephone Encounter (Signed)
14 day ZIO XT long term holter monitor to be mailed to the patients home.  Instructions reviewed briefly as they are included in the monitor kit. 

## 2019-09-17 NOTE — Discharge Summary (Signed)
Brian Ellison [id number] DOB: Nov 11, 1940 DOA: 09/15/2019  PCP: [facility]  Admit date: 09/15/2019 Discharge date: 09/17/2019  Admitted From: Home Disposition: Home  Recommendations for Outpatient Follow-up:  1. Follow up with PCP in 1-2 weeks 2. New medications: Xarelto discontinued.  Aspirin 1 mg 3. Follow-up with cardiology in 2 to 3 weeks arranged, patient will need event monitor as well arranged by cardiology 4.   Home Health: No Equipment/Devices: None Discharge Condition: Stable CODE STATUS: Full Diet recommendation: Carb modified/heart healthy  Brief/Interim Summary: History of present illness:  Brian Ellison is a 79 y.o. year old male with medical history significant for paroxysmal atrial fibrillation on Xarelto, type 2 diabetes who presented on 09/15/2019 with right l calf pain and swelling x2 days without any noted trauma and was found to have right lower leg hematoma on CTA.  Patient was admitted to closely monitor for signs and symptoms of compartment syndrome given fairly high risk per vascular surgery evaluation.   Cardiology was consulted on 1/6 to evaluate necessity for anticoagulation and recommended to complete discontinue his Xarelto with no need to bridge to new anticoagulant and discharged to start aspirin with plan for event monitor to assess for any recurrent atrial fibrillation given patient with normal sinus rhythm throughout hospitalization.  Patient did not need surgical decompression as right calf pain/swelling improved with supportive care including leg elevation and discontinuation of home anticoagulant.  Patient work with PT prior to discharge as needed stable for discharge with no DME requirements.   Hospital Course:   Right calf hematoma, in setting of Xarelto No compartment syndrome throughout hospital stay, remained neurologically intact with normal Doppler signals -Did not require surgical decompression per vascular surgery -Xarelto  discontinued  Paroxysmal atrial fibrillation, currently rate controlled and in normal sinus rhythm -continue home amiodarone -On discharge will start aspirin 81 mg, per cardiology recommendations -Plan for outpatient 2-week event monitor, to be arranged by cardiology -Outpatient follow-up arranged with cardiology in 2 to 3 weeks post discharge  Chronic medical problems, remained stable Hyperlipidemia, continue atorvastatin OSA, continue CPAP HTN,  continue Toprol GERD, continue Protonix leg syndrome, continue Requip Type 2 diabetes, resume home Metformin Chronic diastolic CHF, continue home Lasix  Consultations:  Cardiology, vascular  Procedures/Studies: None Subjective: Feels right calf pain has improved significantly since admission No other complaints Discharge Exam: Vitals:   09/17/19 0420 09/17/19 0915  BP: 125/67 106/65  Pulse: 67 64  Resp: 18 18  Temp: 98.5 F (36.9 C)   SpO2: 95%    Vitals:   09/16/19 1455 09/16/19 2024 09/17/19 0420 09/17/19 0915  BP: (!) 123/105 (!) 152/65 125/67 106/65  Pulse: 76 73 67 64  Resp: 20 16 18 18   Temp: 98.2 F (36.8 C) 99.5 F (37.5 C) 98.5 F (36.9 C)   TempSrc: Oral Oral Oral   SpO2: 97% 95% 95%     General: Sitting in bedside chair, no apparent distress Eyes: EOMI, anicteric ENT: Oral Mucosa clear and moist Cardiovascular: regular rate and rhythm, no murmurs, rubs or gallops, no edema, Respiratory: Normal respiratory effort on room air, lungs clear to auscultation bilaterally Abdomen: soft, non-distended, non-tender, normal bowel sounds Skin: Right lower calf with palpable fullness but less tender to palpation, no erythema, Neurologic: Grossly no focal neuro deficit.Mental status AAOx3, speech normal, Psychiatric:Appropriate affect, and mood  Discharge Diagnoses:  Principal Problem:   Leg hematoma, right, initial encounter Active Problems:   Hyperlipidemia   Obstructive sleep apnea   PAF (paroxysmal  atrial  fibrillation) (HCC)   DM (diabetes mellitus), type 2 ([place])   On anticoagulant therapy    Discharge Instructions  Discharge Instructions    Diet - low sodium heart healthy   Complete by: As directed    Increase activity slowly   Complete by: As directed      Allergies as of 09/17/2019   No Known Allergies     Medication List    STOP taking these medications   rivaroxaban 20 MG Tabs tablet Commonly known as: XARELTO     TAKE these medications   amiodarone 200 MG tablet Commonly known as: PACERONE Take 1 tablet (200 mg total) by mouth daily.   ARTIFICIAL TEAR SOLUTION OP Place 1 drop into both eyes daily as needed (dry eyes).   aspirin EC 81 MG tablet Take 1 tablet (81 mg total) by mouth daily.   atorvastatin 10 MG tablet Commonly known as: LIPITOR Take 1 tablet (10 mg total) by mouth daily.   brimonidine 0.15 % ophthalmic solution Commonly known as: ALPHAGAN Place 1 drop into both eyes 2 (two) times daily.   cetirizine 10 MG tablet Commonly known as: ZYRTEC Take 10 mg by mouth daily.   fluticasone 50 MCG/ACT nasal spray Commonly known as: FLONASE Place 1 spray into both nostrils at bedtime.   furosemide 20 MG tablet Commonly known as: LASIX Take 1 tablet (20 mg total) by mouth every other day. 10mg  every other day   metFORMIN 500 MG tablet Commonly known as: GLUCOPHAGE Take 1,000 mg by mouth 2 (two) times daily with a meal.   Metoprolol Succinate 25 MG Cs24 Take 25 mg by mouth daily.   pantoprazole 40 MG tablet Commonly known as: PROTONIX Take 40 mg by mouth daily.   potassium chloride 10 MEQ tablet Commonly known as: KLOR-CON Take 1 tablet (10 mEq total) by mouth daily.   rOPINIRole 1 MG tablet Commonly known as: REQUIP Take 1 mg by mouth at bedtime.   vitamin B-12 500 MCG tablet Commonly known as: CYANOCOBALAMIN Take 750 mcg by mouth daily.   VITAMIN D3 COMPLETE PO Take 50 mcg by mouth daily.      Follow-up Information    Sueanne Margarita, MD Follow up.   Specialty: Cardiology Why: 2 week monitor and follow up with MD, the office will call.   Contact information: [id number] [place] [address] [phone]          No Known Allergies      The results of significant diagnostics from this hospitalization (including imaging, microbiology, ancillary and laboratory) are listed below for reference.     Microbiology: Recent Results (from the past 240 hour(s))  SARS CORONAVIRUS 2 (TAT 6-24 HRS) Nasopharyngeal Nasopharyngeal Swab     Status: None   Collection Time: 09/15/19  8:55 PM   Specimen: Nasopharyngeal Swab  Result Value Ref Range Status   SARS Coronavirus 2 NEGATIVE NEGATIVE Final    Comment: (NOTE) SARS-CoV-2 target nucleic acids are NOT DETECTED. The SARS-CoV-2 RNA is generally detectable in upper and lower respiratory specimens during the acute phase of infection. Negative results do not preclude SARS-CoV-2 infection, do not rule out co-infections with other pathogens, and should not be used as the sole basis for treatment or other patient management decisions. Negative results must be combined with clinical observations, patient history, and epidemiological information. The expected result is Negative. Fact Sheet for Patients: SugarRoll.be Fact Sheet for Healthcare Providers: https://www.woods-mathews.com/ This test is not yet  approved or cleared by the [place] and  has been authorized for detection and/or diagnosis of SARS-CoV-2 by FDA under an Emergency Use Authorization (EUA). This EUA will remain  in effect (meaning this test can be used) for the duration of the COVID-19 declaration under Section 56 4(b)(1) of the Act, 21 U.S.C. section 360bbb-3(b)(1), unless the authorization is terminated or revoked sooner. Performed at [facility], [place] [address], [place], [place] [address]      Labs: BNP (last 3  results) Recent Labs    10/04/18 1009 11/12/18 1045  BNP 84.2 [id number]   Basic Metabolic Panel: Recent Labs  Lab 09/15/19 1643 09/15/19 1900 09/16/19 0430  NA 142 143 141  K 4.0 4.4 4.2  CL 105 107 106  CO2  --  27 24  GLUCOSE 151* 143* 196*  BUN 12 10 10   CREATININE 0.80 0.88 0.91  CALCIUM  --  8.7* 8.6*   Liver Function Tests: No results for input(s): AST, ALT, ALKPHOS, BILITOT, PROT, ALBUMIN in the last 168 hours. No results for input(s): LIPASE, AMYLASE in the last 168 hours. No results for input(s): AMMONIA in the last 168 hours. CBC: Recent Labs  Lab 09/15/19 1410 09/15/19 1643 09/16/19 0430 09/17/19 0445  WBC 8.7  --  8.1 7.4  NEUTROABS 6.1  --   --   --   HGB 13.0 12.6* 12.1* 11.8*  HCT 41.9 37.0* 37.3* 36.3*  MCV 90.7  --  88.6 89.2  PLT 175  --  176 177   Cardiac Enzymes: No results for input(s): CKTOTAL, CKMB, CKMBINDEX, TROPONINI in the last 168 hours. BNP: Invalid input(s): POCBNP CBG: Recent Labs  Lab 09/16/19 1242 09/16/19 1645 09/16/19 2152 09/17/19 0628 09/17/19 1151  GLUCAP 200* 162* 188* 204* 216*   D-Dimer No results for input(s): DDIMER in the last 72 hours. Hgb A1c Recent Labs    09/15/19 1410  HGBA1C 8.3*   Lipid Profile No results for input(s): CHOL, HDL, LDLCALC, TRIG, CHOLHDL, LDLDIRECT in the last 72 hours. Thyroid function studies No results for input(s): TSH, T4TOTAL, T3FREE, THYROIDAB in the last 72 hours.  Invalid input(s): FREET3 Anemia work up No results for input(s): VITAMINB12, FOLATE, FERRITIN, TIBC, IRON, RETICCTPCT in the last 72 hours. Urinalysis    Component Value Date/Time   COLORURINE YELLOW 10/04/2018 1947   APPEARANCEUR CLEAR 10/04/2018 1947   LABSPEC 1.028 10/04/2018 1947   PHURINE 6.0 10/04/2018 1947   GLUCOSEU >=500 (A) 10/04/2018 1947   HGBUR NEGATIVE 10/04/2018 [place] NEGATIVE 10/04/2018 [place] NEGATIVE 10/04/2018 [place] NEGATIVE 10/04/2018 1947    UROBILINOGEN 1.0 07/15/2009 0857   NITRITE NEGATIVE 10/04/2018 1947   LEUKOCYTESUR NEGATIVE 10/04/2018 1947   Sepsis Labs Invalid input(s): PROCALCITONIN,  WBC,  LACTICIDVEN Microbiology Recent Results (from the past 240 hour(s))  SARS CORONAVIRUS 2 (TAT 6-24 HRS) Nasopharyngeal Nasopharyngeal Swab     Status: None   Collection Time: 09/15/19  8:55 PM   Specimen: Nasopharyngeal Swab  Result Value Ref Range Status   SARS Coronavirus 2 NEGATIVE NEGATIVE Final    Comment: (NOTE) SARS-CoV-2 target nucleic acids are NOT DETECTED. The SARS-CoV-2 RNA is generally detectable in upper and lower respiratory specimens during the acute phase of infection. Negative results do not preclude SARS-CoV-2 infection, do not rule out co-infections with other pathogens, and should not be used as the sole basis for treatment or other patient management decisions. Negative results must be combined with clinical  observations, patient history, and epidemiological information. The expected result is Negative. Fact Sheet for Patients: SugarRoll.be Fact Sheet for Healthcare Providers: https://www.woods-mathews.com/ This test is not yet approved or cleared by the [place] FDA and  has been authorized for detection and/or diagnosis of SARS-CoV-2 by FDA under an Emergency Use Authorization (EUA). This EUA will remain  in effect (meaning this test can be used) for the duration of the COVID-19 declaration under Section 56 4(b)(1) of the Act, 21 U.S.C. section 360bbb-3(b)(1), unless the authorization is terminated or revoked sooner. Performed at [facility], [place] [address] [place], [place] [address]      Time coordinating discharge: Over 30 minutes  SIGNED:   Desiree Hane, MD  [facility] 09/17/2019, 2:20 PM Pager   If 7PM-7AM, please contact night-coverage [email or web address] Password [email or web address]

## 2019-09-17 NOTE — Addendum Note (Signed)
Addended by: Lonn Georgia on: 09/17/2019 12:44 PM   Modules accepted: Orders

## 2019-09-17 NOTE — Discharge Instructions (Signed)
Discontinue Xarelto   Start aspirin 81 mg daily   Will obtain Zio patch monitor--they will contact you   Follow up as an outpatient: We will arrange with Dr. Radford Pax in 2 to 3 weeks

## 2019-09-17 NOTE — Evaluation (Signed)
Physical Therapy Evaluation Patient Details Name: Brian Ellison MRN: [id number] DOB: [birth year] Today's Date: 09/17/2019   History of Present Illness  Brian Ellison is a 79 y.o. M admitted for R leg pain secondary to a R leg hematoma due to Xarelto use. PMH: a-fib, DM2, GERD, chronic low back pain, hx of neck injury with central cord syndrome in 2005.   Clinical Impression  Pt admitted for above. He presents with deficits in balance, pain, gait, and strength. He was able to complete sit to stand transfers modified independent and gait min guard with use of RW. Pt was a little unsteady without AD, but was able to ambulate safely in the hall with RW. He has a slight limp on the R leg due to pain but no LOB or unsteadiness. Completed 4 stairs with use of rail and hand-held assist to mimic home set up, which pt navigated well. He states his wife will be able to help him at home with mobility if necessary, so no further PT recommended following discharge. Pt educated on using RW at home to help with balance while he has R leg pain, and pt agreed. Pt would benefit from continued skilled acute PT intervention in order to address deficits.     Follow Up Recommendations No PT follow up    Equipment Recommendations  None recommended by PT    Recommendations for Other Services       Precautions / Restrictions Precautions Precautions: None Restrictions Weight Bearing Restrictions: No      Mobility  Bed Mobility               General bed mobility comments: in chair upon arrival  Transfers Overall transfer level: Modified independent Equipment used: None             General transfer comment: pt required minimal increased time in order to stand, but did so x3 without physical assistance  Ambulation/Gait Ambulation/Gait assistance: Min guard Gait Distance (Feet): 200 Feet Assistive device: Rolling walker (2 wheeled);None Gait Pattern/deviations: Decreased step length -  right;Decreased stride length;Trunk flexed Gait velocity: decreased   General Gait Details: pt with decreased gait speed and decreased step length on the R leg due to pain; pt demonstrated some unsteadiness without an AD, but was steadied with use of RW  Stairs Stairs: Yes Stairs assistance: Min guard Stair Management: One rail Left;Forwards(with R UE hand held assist + rail) Number of Stairs: 4 General stair comments: pt able to navigate stairs with min guard for safety well with use of rail+ hand held to mimic home set up; required cueing to ascend with step up pattern rather than alternating for safety  Wheelchair Mobility    Modified Rankin (Stroke Patients Only)       Balance Overall balance assessment: Needs assistance Sitting-balance support: No upper extremity supported;Feet supported Sitting balance-Leahy Scale: Normal Sitting balance - Comments: no difficulties   Standing balance support: During functional activity;Bilateral upper extremity supported;No upper extremity supported Standing balance-Leahy Scale: Fair Standing balance comment: static standing balance good, but required BUE support on RW during gait for improved steadiness                             Pertinent Vitals/Pain Pain Assessment: No/denies pain    Home Living Family/patient expects to be discharged to:: Private residence Living Arrangements: Spouse/significant other Available Help at Discharge: Family;Available 24 hours/day Type of Home: House Home Access: Stairs to enter  Entrance Stairs-Rails: Can reach both Entrance Stairs-Number of Steps: 3 Home Layout: One level Home Equipment: Cane - single point;Walker - 4 wheels;Walker - 2 wheels      Prior Function Level of Independence: Independent               Hand Dominance   Dominant Hand: Right    Extremity/Trunk Assessment   Upper Extremity Assessment Upper Extremity Assessment: Overall WFL for tasks assessed     Lower Extremity Assessment Lower Extremity Assessment: RLE deficits/detail RLE Deficits / Details: hematoma causing a little limp and pt states he has some pain with gait    Cervical / Trunk Assessment Cervical / Trunk Assessment: Normal  Communication   Communication: No difficulties  Cognition Arousal/Alertness: Awake/alert Behavior During Therapy: WFL for tasks assessed/performed Overall Cognitive Status: Within Functional Limits for tasks assessed                                        General Comments      Exercises     Assessment/Plan    PT Assessment Patient needs continued PT services  PT Problem List Decreased strength;Decreased balance;Decreased mobility;Decreased knowledge of use of DME;Decreased safety awareness;Decreased knowledge of precautions;Pain       PT Treatment Interventions DME instruction;Gait training;Stair training;Functional mobility training;Therapeutic activities;Balance training;Therapeutic exercise;Modalities;Patient/family education    PT Goals (Current goals can be found in the Care Plan section)  Acute Rehab PT Goals Patient Stated Goal: go home PT Goal Formulation: With patient Time For Goal Achievement: 10/01/19 Potential to Achieve Goals: Good    Frequency Min 3X/week   Barriers to discharge        Co-evaluation               AM-PAC PT "6 Clicks" Mobility  Outcome Measure Help needed turning from your back to your side while in a flat bed without using bedrails?: None Help needed moving from lying on your back to sitting on the side of a flat bed without using bedrails?: None Help needed moving to and from a bed to a chair (including a wheelchair)?: None Help needed standing up from a chair using your arms (e.g., wheelchair or bedside chair)?: None Help needed to walk in hospital room?: A Little Help needed climbing 3-5 steps with a railing? : A Little 6 Click Score: 22    End of Session Equipment  Utilized During Treatment: Gait belt Activity Tolerance: Patient tolerated treatment well Patient left: in chair;with call bell/phone within reach Nurse Communication: Mobility status PT Visit Diagnosis: Unsteadiness on feet (R26.81);Muscle weakness (generalized) (M62.81);Other abnormalities of gait and mobility (R26.89);Pain Pain - Right/Left: Right Pain - part of body: Leg    Time: [id number] PT Time Calculation (min) (ACUTE ONLY): 19 min   Charges:   PT Evaluation $PT Eval Moderate Complexity: 1 Mod          Christel Mormon, SPT   Karrine Kluttz 09/17/2019, 11:19 AM

## 2019-09-17 NOTE — Progress Notes (Signed)
Progress Note  Patient Name: Brian Ellison Date of Encounter: 09/17/2019  Primary Cardiologist: Fransico Him, MD   Subjective   Dates that his pain has improved since yesterday.  Inpatient Medications    Scheduled Meds: . amiodarone  200 mg Oral Daily  . atorvastatin  10 mg Oral Daily  . brimonidine  1 drop Both Eyes BID  . fluticasone  1 spray Each Nare QHS  . furosemide  20 mg Oral QODAY  . insulin aspart  0-15 Units Subcutaneous TID WC  . insulin aspart  0-5 Units Subcutaneous QHS  . metoprolol succinate  25 mg Oral Daily  . pantoprazole  40 mg Oral Daily  . potassium chloride  10 mEq Oral Daily  . rOPINIRole  1 mg Oral QHS  . sodium chloride flush  10-40 mL Intracatheter Q12H   Continuous Infusions:  PRN Meds: acetaminophen **OR** acetaminophen, HYDROcodone-acetaminophen, ondansetron **OR** ondansetron (ZOFRAN) IV, sodium chloride flush   Vital Signs    Vitals:   09/16/19 1455 09/16/19 2024 09/17/19 0420 09/17/19 0915  BP: (!) 123/105 (!) 152/65 125/67 106/65  Pulse: 76 73 67 64  Resp: 20 16 18    Temp: 98.2 F (36.8 C) 99.5 F (37.5 C) 98.5 F (36.9 C)   TempSrc: Oral Oral Oral   SpO2: 97% 95% 95%     Intake/Output Summary (Last 24 hours) at 09/17/2019 1001 Last data filed at 09/17/2019 0900 Gross per 24 hour  Intake 390 ml  Output 1025 ml  Net -635 ml   Last 3 Weights 08/12/2019 04/03/2019 02/09/2019  Weight (lbs) 250 lb 250 lb 239 lb  Weight (kg) 113.399 kg 113.399 kg 108.41 kg     Telemetry    SR- Personally Reviewed  ECG    No new tracing - Personally Reviewed  Physical Exam   GEN: No acute distress.   Neck: No JVD Cardiac: RRR, no murmurs, rubs, or gallops.  Respiratory: Clear to auscultation bilaterally. GI: Soft, nontender, non-distended  MS: mild RLE edema; No deformity. Neuro:  Nonfocal  Psych: Normal affect   Labs    High Sensitivity Troponin:  No results for input(s): TROPONINIHS in the last 720 hours.    Chemistry Recent  Labs  Lab 09/15/19 1643 09/15/19 1900 09/16/19 0430  NA 142 143 141  K 4.0 4.4 4.2  CL 105 107 106  CO2  --  27 24  GLUCOSE 151* 143* 196*  BUN 12 10 10   CREATININE 0.80 0.88 0.91  CALCIUM  --  8.7* 8.6*  GFRNONAA  --  >60 >60  GFRAA  --  >60 >60  ANIONGAP  --  9 11    Hematology Recent Labs  Lab 09/15/19 1410 09/15/19 1643 09/16/19 0430 09/17/19 0445  WBC 8.7  --  8.1 7.4  RBC 4.62  --  4.21* 4.07*  HGB 13.0 12.6* 12.1* 11.8*  HCT 41.9 37.0* 37.3* 36.3*  MCV 90.7  --  88.6 89.2  MCH 28.1  --  28.7 29.0  MCHC 31.0  --  32.4 32.5  RDW 14.0  --  14.0 13.7  PLT 175  --  176 177   BNPNo results for input(s): BNP, PROBNP in the last 168 hours.   DDimer No results for input(s): DDIMER in the last 168 hours.   Radiology    CT ANGIO LOW EXTREM RIGHT W &/OR WO CONTRAST  Result Date: 09/15/2019 CLINICAL DATA:  Right lower extremity pain and swelling, initial encounter EXAM: CT ANGIOGRAPHY OF THE RIGHT  LOWEREXTREMITY TECHNIQUE: Multidetector CT imaging of the right lower extremitywas performed using the standard protocol during bolus administration of intravenous contrast. Multiplanar CT image reconstructions and MIPs were obtained to evaluate the vascular anatomy. CONTRAST:  120mL OMNIPAQUE IOHEXOL 350 MG/ML SOLN COMPARISON:  None. FINDINGS: Vascular: Distal abdominal aorta demonstrates atherosclerotic calcifications although no aneurysmal dilatation is seen. Visualized portions of the left iliac system are within normal limits. Right common, external and internal iliac arteries are widely patent with mild atherosclerotic calcifications. No filling defects or focal stenoses are seen. Right common femoral artery and femoral bifurcation are within normal limits. The superficial femoral artery demonstrates mild atherosclerotic calcifications without aneurysmal dilatation or focal stenosis. The visualized portions of the popliteal artery are widely patent although the midportion is  obscured due to scatter artifact from the patient's known knee replacement. Right popliteal trifurcation shows patency of the vessels with evidence of mild atherosclerotic calcifications. The vessels appear patent to the level of the ankle with the anterior tibial and posterior tibial arteries continuing into the foot. No focal high-grade stenosis or filling defect to suggest embolism is seen. No venous abnormality is noted. Nonvascular: In the posterior musculature of the right calf there is a 3.0 x 3.1 x 6.4 cm mildly hyperdense area with a fluid-fluid level within. This likely represents a focal muscular hematoma just deep to the soleus muscle within the flexor muscles of the calf. Patient gives a history of Xarelto use. Correlation with any recent trauma is recommended. The remainder of the muscular structures of the lower extremity on the right are noted. 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 The visualized portions of the bladder and bowel are unremarkable. Degenerative changes of lumbar spine are seen. No lower extremity bony abnormality is noted. Review of the MIP images confirms the above findings. IMPRESSION: Changes consistent with focal hematoma within the deep musculature of the right calf as described. This is likely related to the patient's known blood thinner use. No evidence of arterial emboli or occlusive changes are seen. Normal three-vessel runoff to the ankle on the right is noted. Electronically Signed   By: Inez Catalina M.D.   On: 09/15/2019 18:55   VAS [place] LOWER EXTREMITY VENOUS (DVT) (ONLY [facility] & [facility])  Result Date: 09/15/2019  Lower Venous Study Indications: Pain and swelling in right calf for several days.  Anticoagulation: Xarelto. Limitations: Body habitus. Performing Technologist: Oda Cogan RDMS, RVT  Examination Guidelines: A complete evaluation includes B-mode imaging, spectral Doppler, color Doppler, and power Doppler as needed  of all accessible portions of each vessel. Bilateral testing is considered an integral part of a complete examination. Limited examinations for reoccurring indications may be performed as noted.  +---------+---------------+---------+-----------+----------+------------------+ RIGHT    CompressibilityPhasicitySpontaneityPropertiesThrombus Aging     +---------+---------------+---------+-----------+----------+------------------+ CFV      Full           Yes      Yes                                     +---------+---------------+---------+-----------+----------+------------------+ SFJ      Full           Yes      Yes                                     +---------+---------------+---------+-----------+----------+------------------+  FV Prox  Full                                                            +---------+---------------+---------+-----------+----------+------------------+ FV Mid   Full                                                            +---------+---------------+---------+-----------+----------+------------------+ FV DistalFull                                                            +---------+---------------+---------+-----------+----------+------------------+ PFV      Full                                                            +---------+---------------+---------+-----------+----------+------------------+ POP      Full           Yes      Yes                                     +---------+---------------+---------+-----------+----------+------------------+ PTV                                                   Patent with color                                                        flow               +---------+---------------+---------+-----------+----------+------------------+ PERO                                                  Patent with color                                                        flow                +---------+---------------+---------+-----------+----------+------------------+ No evidence of obvious DVT in the calf veins by color flow imaging.  +----+---------------+---------+-----------+----------+--------------+ LEFTCompressibilityPhasicitySpontaneityPropertiesThrombus Aging +----+---------------+---------+-----------+----------+--------------+ CFV Full           Yes  Yes                                 +----+---------------+---------+-----------+----------+--------------+ SFJ Full                                                        +----+---------------+---------+-----------+----------+--------------+     Summary: Right: There is no evidence of deep vein thrombosis in the lower extremity. No cystic structure found in the popliteal fossa. Left: No evidence of common femoral vein obstruction.  *See table(s) above for measurements and observations. Electronically signed by Harold Barban MD on 09/15/2019 at 1:43:28 PM.    Final     Cardiac Studies     Patient Profile     79 y.o. male   Assessment & Plan    Right calf hematoma -confirmed on CT, followed by Dr. Trula Slade, he will probably not need  fasciotomy and surgical decompression.  Possible discharge today. -We will continue to hold Xarelto, no need for bridging with heparin. -The plan would be hold Xarelto, we will arrange for Zio patch monitor to be placed on him before he lives home, we will arrange for outpatient follow-up with Dr. Radford Pax, we will decide whether to restart Xarelto at that point depending on Zio patch monitor results.  Paroxysmal atrial fibrillation -denies any palpitations and thinks he has been in NSR -Patient had 2 documented episodes of atrial fibrillation in the settings of pneumonia, in the settings of significant hematoma without any precipitating factor I would hold off Xarelto from now on no need for bridging, I would continue holding on discharge and perform 2-week event  monitor, and if he does not have any episodes of atrial fibrillation I would not restart Xarelto right now -continue Amio 200mg  daily, Toprol XL 25mg  daily and Xarelto 20mg  daily -he has not had any bleeding problems on DOAC -Hemoglobin is 12.1, platelets 176 -TSH 2.5 -check PFTs with DLCO for Amio  Coronary artery calcifications  -no ischemia on nuclear stress test3/2020 -denies any CP -We will restart aspirin 81 mg daily at the discharge given the fact that he is not on anticoagulation anymore.    Hyperlipidemia  -LDL goal <70 due to coronary artery calcifications. -LDL was 46 in July -continue Atorvastatin 10mg  daily  OSA -On CPAP  [facility] will sign off.   Medication Recommendations: Hold Xarelto for now, start aspirin 81 mg daily Other recommendations (labs, testing, etc): He will obtain Zio patch monitor before discharge Follow up as an outpatient: We will arrange with Dr. Radford Pax in 2 to 3 weeks  For questions or updates, please contact [place] Please consult [email or web address] for contact info under     Signed, Ena Dawley, MD  09/17/2019, 10:01 AM

## 2019-09-17 NOTE — Progress Notes (Addendum)
[facility]  Subjective  - States he is feeling better.   Objective 125/67 67 98.5 F (36.9 C) (Oral) 18 95%  Intake/Output Summary (Last 24 hours) at 09/17/2019 [id number] Last data filed at 09/16/2019 2230 Gross per 24 hour  Intake 150 ml  Output 825 ml  Net -675 ml    Right LE with non pitting edema Doppler signals PT/DP/Peroneal intact right LE Active range of motion and sensation intact right LE Lungs non labored breathing  Assessment/Planning:  Right calf hematoma, in setting of Xarelto Will get him up to walk today and see how he does. Xarelto on hold    Roxy Horseman 09/17/2019 7:28 AM --  Laboratory Lab Results: Recent Labs    09/16/19 0430 09/17/19 0445  WBC 8.1 7.4  HGB 12.1* 11.8*  HCT 37.3* 36.3*  PLT 176 177   BMET Recent Labs    09/15/19 1900 09/16/19 0430  NA 143 141  K 4.4 4.2  CL 107 106  CO2 27 24  GLUCOSE 143* 196*  BUN 10 10  CREATININE 0.88 0.91  CALCIUM 8.7* 8.6*    COAG Lab Results  Component Value Date   INR 1.48 04/21/2013   INR 1.39 04/20/2013   INR 1.24 04/19/2013   No results found for: PTT   Calf pain and tenderness is improved.  At this point, I doubt he will need surgical decompression.  I appreciate cardiology evaluation.  Xaralto on hold for now.  If patient is cleared by PT today, he could go home later this afternoon.  Annamarie Major

## 2019-10-07 ENCOUNTER — Ambulatory Visit: Payer: No Typology Code available for payment source

## 2019-10-16 ENCOUNTER — Encounter: Payer: Self-pay | Admitting: Physician Assistant

## 2019-10-16 ENCOUNTER — Other Ambulatory Visit: Payer: Self-pay

## 2019-10-16 ENCOUNTER — Ambulatory Visit (INDEPENDENT_AMBULATORY_CARE_PROVIDER_SITE_OTHER): Payer: Medicare Other | Admitting: Family Medicine

## 2019-10-16 VITALS — BP 116/56 | HR 69 | Ht 72.0 in | Wt 254.4 lb

## 2019-10-16 DIAGNOSIS — I10 Essential (primary) hypertension: Secondary | ICD-10-CM | POA: Diagnosis not present

## 2019-10-16 DIAGNOSIS — E782 Mixed hyperlipidemia: Secondary | ICD-10-CM | POA: Diagnosis not present

## 2019-10-16 DIAGNOSIS — I480 Paroxysmal atrial fibrillation: Secondary | ICD-10-CM | POA: Diagnosis not present

## 2019-10-16 DIAGNOSIS — I2510 Atherosclerotic heart disease of native coronary artery without angina pectoris: Secondary | ICD-10-CM

## 2019-10-16 DIAGNOSIS — Z79899 Other long term (current) drug therapy: Secondary | ICD-10-CM

## 2019-10-16 LAB — COMPREHENSIVE METABOLIC PANEL
ALT: 16 IU/L (ref 0–44)
AST: 15 IU/L (ref 0–40)
Albumin/Globulin Ratio: 2 (ref 1.2–2.2)
Albumin: 4.2 g/dL (ref 3.7–4.7)
Alkaline Phosphatase: 62 IU/L (ref 39–117)
BUN/Creatinine Ratio: 15 (ref 10–24)
BUN: 16 mg/dL (ref 8–27)
Bilirubin Total: 0.4 mg/dL (ref 0.0–1.2)
CO2: 21 mmol/L (ref 20–29)
Calcium: 9.3 mg/dL (ref 8.6–10.2)
Chloride: 104 mmol/L (ref 96–106)
Creatinine, Ser: 1.06 mg/dL (ref 0.76–1.27)
GFR calc Af Amer: 76 mL/min/{1.73_m2} (ref >59)
GFR calc non Af Amer: 66 mL/min/{1.73_m2} (ref >59)
Globulin, Total: 2.1 g/dL (ref 1.5–4.5)
Glucose: 143 mg/dL — ABNORMAL HIGH (ref 65–99)
Potassium: 4.2 mmol/L (ref 3.5–5.2)
Sodium: 141 mmol/L (ref 134–144)
Total Protein: 6.3 g/dL (ref 6.0–8.5)

## 2019-10-16 LAB — CBC
Hematocrit: 39 % (ref 37.5–51.0)
Hemoglobin: 12.8 g/dL — ABNORMAL LOW (ref 13.0–17.7)
MCH: 27.7 pg (ref 26.6–33.0)
MCHC: 32.8 g/dL (ref 31.5–35.7)
MCV: 84 fL (ref 79–97)
Platelets: 232 10*3/uL (ref 150–450)
RBC: 4.62 x10E6/uL (ref 4.14–5.80)
RDW: 13.7 % (ref 11.6–15.4)
WBC: 7.7 10*3/uL (ref 3.4–10.8)

## 2019-10-16 LAB — TSH: TSH: 1.85 u[IU]/mL (ref 0.450–4.500)

## 2019-10-16 NOTE — Patient Instructions (Addendum)
Medication Instructions:  Your physician recommends that you continue on your current medications as directed. Please refer to the Current Medication list given to you today.  *If you need a refill on your cardiac medications before your next appointment, please call your pharmacy*  Lab Work: TODAY:  CBC, CMET, & TSH  If you have labs (blood work) drawn today and your tests are completely normal, you will receive your results only by: Marland Kitchen MyChart Message (if you have MyChart) OR . A paper copy in the mail If you have any lab test that is abnormal or we need to change your treatment, we will call you to review the results.  Testing/Procedures: None ordered  Follow-Up: At [facility], you and your health needs are our priority.  As part of our continuing mission to provide you with exceptional heart care, we have created designated Provider Care Teams.  These Care Teams include your primary Cardiologist (physician) and Advanced Practice Providers (APPs -  Physician Assistants and Nurse Practitioners) who all work together to provide you with the care you need, when you need it.  Your next appointment:   01/19/2020 ARRIVE AT 10:05 TO SEE DR. Radford Pax   Other Instructions The will overnight you a new monitor.  If you don't have it by Monday, call and let us know.   ZIO XT- Long Term Monitor Instructions   Your physician has requested you wear your ZIO patch monitor_______days.   This is a single patch monitor.  [employer] supplies one patch monitor per enrollment.  Additional stickers are not available.   Please do not apply patch if you will be having a Nuclear Stress Test, Echocardiogram, Cardiac CT, MRI, or Chest Xray during the time frame you would be wearing the monitor. The patch cannot be worn during these tests.  You cannot remove and re-apply the ZIO XT patch monitor.   Your ZIO patch monitor will be sent USPS Priority mail from [facility] directly to your home address.  The monitor may also be mailed to a PO BOX if home delivery is not available.   It may take 3-5 days to receive your monitor after you have been enrolled.   Once you have received you monitor, please review enclosed instructions.  Your monitor has already been registered assigning a specific monitor serial # to you.   Applying the monitor   Shave hair from upper left chest.   Hold abrader disc by orange tab.  Rub abrader in 40 strokes over left upper chest as indicated in your monitor instructions.   Clean area with 4 enclosed alcohol pads .  Use all pads to assure are is cleaned thoroughly.  Let dry.   Apply patch as indicated in monitor instructions.  Patch will be place under collarbone on left side of chest with arrow pointing upward.   Rub patch adhesive wings for 2 minutes.Remove white label marked "1".  Remove white label marked "2".  Rub patch adhesive wings for 2 additional minutes.   While looking in a mirror, press and release button in center of patch.  A small green light will flash 3-4 times .  This will be your only indicator the monitor has been turned on.     Do not shower for the first 24 hours.  You may shower after the first 24 hours.   Press button if you feel a symptom. You will hear a small click.  Record Date, Time and Symptom in the Patient Log Book.  When you are ready to remove patch, follow instructions on last 2 pages of Patient Log Book.  Stick patch monitor onto last page of Patient Log Book.   Place Patient Log Book in [place] box.  Use locking tab on box and tape box closed securely.  The [employer] has [employer] on it.  Please place in mailbox as soon as possible.  Your physician should have your test results approximately 7 days after the monitor has been mailed back to [facility].   Call [place] at [phone] if you have questions regarding your ZIO XT patch monitor.  Call them immediately if you see an orange  light blinking on your monitor.   If your monitor falls off in less than 4 days contact our Monitor department at [phone].  If your monitor becomes loose or falls off after 4 days call [employer] at [phone] for suggestions on securing your monitor.

## 2019-10-16 NOTE — Progress Notes (Signed)
Cardiology Office Note  Date: 10/16/2019   ID: Brian Ellison, DOB Aug 04, 1941, MRN [id number]  PCP:  [facility]  Cardiologist:  Fransico Him, MD Electrophysiologist:  None   Chief Complaint  Patient presents with  . Follow-up    Post hospital stay for right leg hematoma and history of atrial fibrillation, coronary artery calcification    History of Present Illness: Brian Ellison is a 79 y.o. male with history of atrial fibrillation/atrial flutter in 2020 (in context of PNA) who is here for post-hospital follow-up of a leg hematoma.   At time of atrial fib diagnosis he was placed on Xarelto and eventually required initiation of amiodarone due to development of atrial flutter. He has done well since then until recently. He had recent presentation to [facility] ED on September 15, 2019 with right calf pain and swelling x2 days without any trauma. Found to have lower leg hematoma on CT angiogram. He was admitted to closely monitor for signs and symptoms of compartment syndrome given fairly high risk for vascular surgeries assessment. He did not require surgical decompression.  Cardiology was consulted January 6 to evaluate  necessity for anticoagulation. Xarelto was discontinued. He was discharged on aspirin with plans for event monitor to reassess for any recurrent atrial fibrillation given the fact he was in normal sinus rhythm throughout the hospital stay.  Other pertinent medical history includes hyperlipidemia, obstructive sleep apnea on CPAP, hypertension on Toprol, GERD on Protonix, type 2 diabetes on Metformin, chronic diastolic CHF on Lasix.  Patient states he is doing well.  States the right lower extremity hematoma has resolved.  He denies any calf pain, shortness of breath, or tachycardia.  Per event monitor team, patient's event monitor malfunctioned and he has no data to assess for recurrent PAF.  Past Medical History:  Diagnosis Date  . Allergic rhinitis   .  Arthritis    SPINAL AND JOINTS  . Bladder polyp   . BPH (benign prostatic hypertrophy)   . Chronic back pain   . Diverticulosis   . Dyslipidemia    per pt questionable  . ED (erectile dysfunction) of organic origin   . GERD (gastroesophageal reflux disease)   . Glaucoma    BOTH EYES  . History of colon polyps   . History of gastritis   . History of kidney stones    many yrs ago  . 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 History of shingles   . Lower urinary tract symptoms (LUTS)   . OSA on CPAP    very severe per study 01-31-2006  . Peyronie's disease   . S/P insertion of spinal cord stimulator    2010  . Tingling    right arm;takes Gabapentin  . Type 2 diabetes mellitus ([place])     Past Surgical History:  Procedure Laterality Date  . ANTERIOR CERVICAL DECOMP/DISCECTOMY FUSION  06-16-2004   C3 -- C5  . APPENDECTOMY  age 32  . CATARACT EXTRACTION W/ INTRAOCULAR LENS  IMPLANT, BILATERAL    . COLONOSCOPY    . CYSTOSCOPY WITH BIOPSY N/A 07/15/2015   Procedure: CYSTOSCOPY WITH BLADDER AND PROSTATE BIOPSY;  Surgeon: Festus Aloe, MD;  Location: [facility];  Service: Urology;  Laterality: N/A;  . HAND SURGERY Right 2017   "right hand middle finger knuckle replaced"  . INGUINAL HERNIA REPAIR Bilateral 1970's  . NASAL SEPTUM SURGERY  yrs ago  .  PENILE PROSTHESIS IMPLANT  11-26-2007  . PERMANANT SPINAL CORD STIMULATOR IMPLANT VIA THORACIC LAMINECTOMY  05-12-2009   Medtronic generator (left buttock)  . POSTERIOR LAMINECTOMY / DECOMPRESSION CERVICAL SPINE  03-08-2005   C3  -- C6  . RETINAL DETACHMENT SURGERY Right   . REVERSE SHOULDER ARTHROPLASTY Left 09/23/2015   Procedure: LEFT REVERSE TOTAL SHOULDER ARTHROPLASTY;  Surgeon: Netta Cedars, MD;  Location: [place];  Service: Orthopedics;  Laterality: Left;  . REVISION TOTAL ARTHROPLASTY RIGHT SHOULDER  07-22-2009  . RIGHT HEEL SURGERY     spurs  . RIGHT SHOULDER  HEMIARTHROPLASTY  08-20-2008  . ROTATOR CUFF REPAIR Bilateral x1 right/  x2  left - last one 2013  . SHOULDER OPEN ROTATOR CUFF REPAIR Bilateral   . SPINAL CORD STIMULATOR BATTERY EXCHANGE N/A 07/29/2018   Procedure: Spinal cord stimulator/implantable pulse generator battery change;  Surgeon: Erline Levine, MD;  Location: [facility];  Service: Neurosurgery;  Laterality: N/A;  Spinal cord stimulator/implantable pulse generator battery change  . TONSILLECTOMY    . TOTAL KNEE ARTHROPLASTY Right 04/17/2013   Procedure: RIGHT TOTAL KNEE ARTHROPLASTY;  Surgeon: Augustin Schooling, MD;  Location: [facility];  Service: Orthopedics;  Laterality: Right;  . TOTAL KNEE ARTHROPLASTY Left 02-10-2009    Current Outpatient Medications  Medication Sig Dispense Refill  . Alogliptin Benzoate 25 MG TABS Take 25 mg by mouth daily.    Marland Kitchen amiodarone (PACERONE) 200 MG tablet Take 1 tablet (200 mg total) by mouth daily. 90 tablet 3  . ARTIFICIAL TEAR SOLUTION OP Place 1 drop into both eyes daily as needed (dry eyes).    Marland Kitchen aspirin EC 81 MG tablet Take 1 tablet (81 mg total) by mouth daily. 150 tablet 2  . atorvastatin (LIPITOR) 10 MG tablet Take 1 tablet (10 mg total) by mouth daily. 90 tablet 3  . brimonidine (ALPHAGAN) 0.15 % ophthalmic solution Place 1 drop into both eyes 2 (two) times daily.    . cetirizine (ZYRTEC) 10 MG tablet Take 10 mg by mouth daily.    . fluticasone (FLONASE) 50 MCG/ACT nasal spray Place 1 spray into both nostrils at bedtime.    . furosemide (LASIX) 20 MG tablet Take 1 tablet (20 mg total) by mouth every other day. 10mg  every other day 90 tablet 3  . metFORMIN (GLUCOPHAGE) 500 MG tablet Take 1,000 mg by mouth 2 (two) times daily with a meal.     . Metoprolol Succinate 25 MG CS24 Take 25 mg by mouth daily.    . Multiple Vitamins-Minerals (VITAMIN D3 COMPLETE PO) Take 50 mcg by mouth daily.    . pantoprazole (PROTONIX) 40 MG tablet Take 40 mg by mouth daily.    . potassium chloride (K-DUR) 10 MEQ tablet  Take 1 tablet (10 mEq total) by mouth daily. 90 tablet 3  . rOPINIRole (REQUIP) 1 MG tablet Take 1 mg by mouth at bedtime.      . vitamin B-12 (CYANOCOBALAMIN) 500 MCG tablet Take 750 mcg by mouth daily.      No current facility-administered medications for this visit.   Allergies:  Patient has no known allergies.   Social History: The patient  reports that he quit smoking about 48 years ago. His smoking use included cigarettes. He has a 15.00 pack-year smoking history. He has never used smokeless tobacco. He reports current alcohol use. He reports that he does not use drugs.   Family History: The patient's family history is not on file.   ROS:  Please see the  history of present illness. Otherwise, complete review of systems is positive for none.  All other systems are reviewed and negative.   Physical Exam: VS:  BP (!) 116/56   Pulse 69   Ht 6' (1.829 m)   Wt 254 lb 6.4 oz (115.4 kg)   SpO2 96%   BMI 34.50 kg/m , BMI Body mass index is 34.5 kg/m.  Wt Readings from Last 3 Encounters:  10/16/19 254 lb 6.4 oz (115.4 kg)  08/12/19 250 lb (113.4 kg)  04/03/19 250 lb (113.4 kg)    General: Patient appears comfortable at rest. Neck: Supple, no elevated JVP or carotid bruits, no thyromegaly. Lungs: Clear to auscultation, nonlabored breathing at rest. Cardiac: Regular rate and rhythm, no S3 or significant systolic murmur, no pericardial rub. Extremities: No pitting edema, distal pulses 2+. Skin: Warm and dry. Neuropsychiatric: Alert and oriented x3, affect grossly appropriate.  ECG:  An ECG dated September 15, 2019 was personally reviewed today and demonstrated:  Normal sinus rhythm rate of 68, prolonged QT interval. Borderline T abnormalities in anterior leads. QT/QTc are 438/466 respectively  Recent Labwork: 11/12/2018: B Natriuretic Peptide 333.0 03/24/2019: ALT 17; AST 14; TSH 2.510 09/16/2019: BUN 10; Creatinine, Ser 0.91; Potassium 4.2; Sodium 141 09/17/2019: Hemoglobin 11.8; Platelets  177     Component Value Date/Time   CHOL 120 03/24/2019 0918   TRIG 125 03/24/2019 0918   HDL 49 03/24/2019 0918   CHOLHDL 2.4 03/24/2019 0918   CHOLHDL 3.1 10/04/2018 1332   VLDL 10 10/04/2018 1332   LDLCALC 46 03/24/2019 0918    Other Studies Reviewed Today:  Echocardiogram October 05, 2018 Study Conclusions   - Left ventricle: The cavity size was normal. There was moderate  concentric hypertrophy. Systolic function was normal. The  estimated ejection fraction was in the range of 60% to 65%. Wall  motion was normal; there were no regional wall motion  abnormalities. Findings consistent with left ventricular  diastolic dysfunction, grade indeterminate.  - Aortic valve: Mildly calcified annulus. Trileaflet; mildly  thickened, mildly calcified leaflets. Sclerosis without stenosis.  - Aorta: Ascending aortic diameter: 39 mm (S).  - Mitral valve: Mildly calcified annulus.  - Left atrium: The atrium was mildly dilated.  - Systemic veins: IVC is dilated with normal respiratory variation.  Estimated RAP: 8 mmHg.   Lexiscan Myoview November 20, 2018  The left ventricular ejection fraction is normal (55-65%).  Nuclear stress EF: 65%.  The study is normal.  This is a low risk study.  No prior study for comparison   Carotid artery duplex study November 06, 2018 Summary:  Right Carotid: Velocities in the right ICA are consistent with a 1-39%  stenosis.  Left Carotid: Velocities in the left ICA are consistent with a 1-39%  stenosis.  Vertebrals: Bilateral vertebral arteries demonstrate antegrade flow.   Subclavians: Normal flow hemodynamics were seen in bilateral subclavian arteries.   Lower venous duplex study September 15, 2019 Summary: Right: There is no evidence of deep vein thrombosis in the lower extremity. No cystic structure found in the popliteal fossa. Left: No evidence of common femoral vein obstruction.  CT angio chest PE protocol October 04, 2018 Coronary atherosclerosis with mild amount of calcified plaque in a 3 vessel distribution  Assessment and Plan:  1. PAF (paroxysmal atrial fibrillation) ([place])   2. Coronary artery calcification seen on CAT scan   3. Essential hypertension   4. Mixed hyperlipidemia   5. On amiodarone therapy    1. PAF (paroxysmal  atrial fibrillation) [facility]) Patient recently hospitalized for right lower extremity hematoma, possibly secondary to Xarelto therapy for PAF.  Patient was noted to be in sinus rhythm during hospital stay.  An event monitor was ordered which malfunctioned.  Patient will have a new event monitor placed and further decisions will be made on anticoagulation therapy once resulted.  Patiently currently on aspirin 81 mg and amiodarone was 200 mg daily for atrial fibrillation.  Patient had some anemia during hospital stay.  Get a CBC to monitor H&H.  Get CMP and TSH.  Continue amiodarone 200 mg daily, aspirin 81 mg daily.  Unfortunately his event monitor malfunctioned. A repeat event monitor will be placed to assess for episodes of recurrent PAF.  A decision will be made after results to determine if Xarelto needs to be restarted.   2. Coronary artery calcification seen on CAT scan Patient had evidence of coronary artery calcification on CT scan January 2020.  Report stated coronary atherosclerosis with mild amount of calcified plaque in a 3 vessel distribution.  Patient denies any progressive anginal or exertional symptoms.  Given history of diabetes type 2, hypertension, hyperlipidemia patient will need to be monitored for progression of coronary artery disease.  3. Essential hypertension Blood pressure is well controlled.  Today's blood pressure 116/56.  Continue metoprolol succinate 25 mg daily.  4. Mixed hyperlipidemia Continue atorvastatin 10 mg daily.  Lab work in July 2020 showed a total cholesterol of 120, HDL 49, LDL 46, triglycerides 125.  ALT 17. Followed by primary care.  5. On  amiodarone therapy Patient has PFTs ordered for evaluation of lung function secondary to amiodarone use. It does not appear these have been scheduled yet, likely due to Covid moratorium on non-emergent studies. Get TSH to check thyroid function secondary to amiodarone therapy.  Medication Adjustments/Labs and Tests Ordered: Current medicines are reviewed at length with the patient today.  Concerns regarding medicines are outlined above.    Patient Instructions  Medication Instructions:  Your physician recommends that you continue on your current medications as directed. Please refer to the Current Medication list given to you today.  *If you need a refill on your cardiac medications before your next appointment, please call your pharmacy*  Lab Work: TODAY:  CBC, CMET, & TSH  If you have labs (blood work) drawn today and your tests are completely normal, you will receive your results only by: Marland Kitchen MyChart Message (if you have MyChart) OR . A paper copy in the mail If you have any lab test that is abnormal or we need to change your treatment, we will call you to review the results.  Testing/Procedures: None ordered  Follow-Up: At [facility], you and your health needs are our priority.  As part of our continuing mission to provide you with exceptional heart care, we have created designated Provider Care Teams.  These Care Teams include your primary Cardiologist (physician) and Advanced Practice Providers (APPs -  Physician Assistants and Nurse Practitioners) who all work together to provide you with the care you need, when you need it.  Your next appointment:   01/19/2020 ARRIVE AT 10:05 TO SEE DR. Radford Pax   Other Instructions The will overnight you a new monitor.  If you don't have it by Monday, call and let us know.   ZIO XT- Long Term Monitor Instructions   Your physician has requested you wear your ZIO patch monitor_______days.   This is a single patch monitor.  [employer] supplies  one patch monitor  per enrollment.  Additional stickers are not available.   Please do not apply patch if you will be having a Nuclear Stress Test, Echocardiogram, Cardiac CT, MRI, or Chest Xray during the time frame you would be wearing the monitor. The patch cannot be worn during these tests.  You cannot remove and re-apply the ZIO XT patch monitor.   Your ZIO patch monitor will be sent USPS Priority mail from [facility] directly to your home address. The monitor may also be mailed to a PO BOX if home delivery is not available.   It may take 3-5 days to receive your monitor after you have been enrolled.   Once you have received you monitor, please review enclosed instructions.  Your monitor has already been registered assigning a specific monitor serial # to you.   Applying the monitor   Shave hair from upper left chest.   Hold abrader disc by orange tab.  Rub abrader in 40 strokes over left upper chest as indicated in your monitor instructions.   Clean area with 4 enclosed alcohol pads .  Use all pads to assure are is cleaned thoroughly.  Let dry.   Apply patch as indicated in monitor instructions.  Patch will be place under collarbone on left side of chest with arrow pointing upward.   Rub patch adhesive wings for 2 minutes.Remove white label marked "1".  Remove white label marked "2".  Rub patch adhesive wings for 2 additional minutes.   While looking in a mirror, press and release button in center of patch.  A small green light will flash 3-4 times .  This will be your only indicator the monitor has been turned on.     Do not shower for the first 24 hours.  You may shower after the first 24 hours.   Press button if you feel a symptom. You will hear a small click.  Record Date, Time and Symptom in the Patient Log Book.   When you are ready to remove patch, follow instructions on last 2 pages of Patient Log Book.  Stick patch monitor onto last page of Patient Log Book.    Place Patient Log Book in [place] box.  Use locking tab on box and tape box closed securely.  The [employer] has [employer] on it.  Please place in mailbox as soon as possible.  Your physician should have your test results approximately 7 days after the monitor has been mailed back to [facility].   Call [place] at [phone] if you have questions regarding your ZIO XT patch monitor.  Call them immediately if you see an orange light blinking on your monitor.   If your monitor falls off in less than 4 days contact our Monitor department at [phone].  If your monitor becomes loose or falls off after 4 days call [employer] at [phone] for suggestions on securing your monitor.           Signed, Levell July, NP 10/16/2019 11:25 AM  [facility] [id number] [place], [place]

## 2019-10-19 ENCOUNTER — Ambulatory Visit (INDEPENDENT_AMBULATORY_CARE_PROVIDER_SITE_OTHER): Payer: Medicare Other

## 2019-10-19 DIAGNOSIS — I480 Paroxysmal atrial fibrillation: Secondary | ICD-10-CM

## 2019-10-20 ENCOUNTER — Telehealth: Payer: Self-pay

## 2019-10-20 NOTE — Telephone Encounter (Signed)
Lpm with lab results 2/9.

## 2019-10-20 NOTE — Telephone Encounter (Signed)
-----   Message from Verta Ellen., NP sent at 10/19/2019 10:57 AM EST ----- Let patient note know his lab work was ok.Blood sugar was a little elevated but otherwise everything else looked good.

## 2019-11-13 DIAGNOSIS — I480 Paroxysmal atrial fibrillation: Secondary | ICD-10-CM | POA: Diagnosis not present

## 2019-11-17 ENCOUNTER — Telehealth: Payer: Self-pay

## 2019-11-17 NOTE — Telephone Encounter (Signed)
-----   Message from Nuala Alpha, LPN sent at [id number]  9:46 AM EST ----- This is a Dr. Radford Pax pt.  Not sure why Dr. Meda Coffee resulted, but maybe she is covering her basket.  ----- Message ----- From: Dorothy Spark, MD Sent: 11/17/2019   9:43 AM EST To: Nuala Alpha, LPN  Discontinue amiodarone, no need to restart Xarelto.

## 2019-12-03 MED ORDER — METOPROLOL SUCCINATE 25 MG PO CS24: 25.0000 mg | EXTENDED_RELEASE_CAPSULE | Freq: Every day | ORAL | 3 refills | Status: DC

## 2019-12-08 DIAGNOSIS — Z96611 Presence of right artificial shoulder joint: Secondary | ICD-10-CM | POA: Diagnosis not present

## 2019-12-08 DIAGNOSIS — M79672 Pain in left foot: Secondary | ICD-10-CM | POA: Diagnosis not present

## 2019-12-08 DIAGNOSIS — Z96612 Presence of left artificial shoulder joint: Secondary | ICD-10-CM | POA: Diagnosis not present

## 2019-12-08 DIAGNOSIS — M66872 Spontaneous rupture of other tendons, left ankle and foot: Secondary | ICD-10-CM | POA: Diagnosis not present

## 2019-12-08 DIAGNOSIS — Z471 Aftercare following joint replacement surgery: Secondary | ICD-10-CM | POA: Diagnosis not present

## 2020-01-19 ENCOUNTER — Ambulatory Visit: Payer: Medicare Other | Admitting: Cardiology

## 2020-01-27 ENCOUNTER — Other Ambulatory Visit: Payer: Self-pay

## 2020-01-27 ENCOUNTER — Encounter: Payer: Self-pay | Admitting: Cardiology

## 2020-01-27 ENCOUNTER — Ambulatory Visit (INDEPENDENT_AMBULATORY_CARE_PROVIDER_SITE_OTHER): Payer: Medicare Other | Admitting: Cardiology

## 2020-01-27 VITALS — BP 122/68 | HR 60 | Ht 72.0 in | Wt 261.5 lb

## 2020-01-27 DIAGNOSIS — E1122 Type 2 diabetes mellitus with diabetic chronic kidney disease: Secondary | ICD-10-CM

## 2020-01-27 DIAGNOSIS — G4733 Obstructive sleep apnea (adult) (pediatric): Secondary | ICD-10-CM | POA: Diagnosis not present

## 2020-01-27 DIAGNOSIS — I480 Paroxysmal atrial fibrillation: Secondary | ICD-10-CM

## 2020-01-27 DIAGNOSIS — E7800 Pure hypercholesterolemia, unspecified: Secondary | ICD-10-CM | POA: Diagnosis not present

## 2020-01-27 DIAGNOSIS — I2510 Atherosclerotic heart disease of native coronary artery without angina pectoris: Secondary | ICD-10-CM

## 2020-01-27 DIAGNOSIS — N181 Chronic kidney disease, stage 1: Secondary | ICD-10-CM

## 2020-01-27 NOTE — Progress Notes (Addendum)
Date:  01/27/2020   ID:  Brian Ellison, DOB Feb 07, 1941, MRN [id number]   PCP:  [facility]  Cardiologist:  Fransico Him, MD  Electrophysiologist:  None   Chief Complaint:    History of Present Illness:     This is a 79yo male with a history of Dm, OSA and hyperlipidemia who was admitted to [facility] 10/04/2018 with complaints of CP. Chest CT neg for PE but showed mild 3v CAD. He was dx with LLL PNA by chest CT treated with antibx. He then developed chills, rigors and fever and developed afib with RVR treated with Lopressor 12.5mg  BID. Echo showed normal LVF with no signficant valvular heart dz. There was mild LAE. TSH was normal. He was started on Xarelto 20mg  daily for CHADS2VAS score of 3. He converted back to NSR the next day.   He was seen back 10/2018 and was back in atrial flutter and was referred to afib clinic.  He did not have any further CP which was felt to be related to PNA in hospital. In afib clinic, therapeutic options including Tikosyn, amiodarone, vs rate control. He did not want Tikosyn because his wife had an episode of torsades de pointes while being loaded on the medication. He wanted to take some time to consider amiodarone after discussing risks and benefits of the medication.   He later decided to start on Amio and was loaded on 200mg  BID and converted to NSR at next afib clinic.  His Amio was decreased to 200mg  daily.  Due to ongoing DOE , Nuclear stress test was done which showed no ischemia.  Since I saw him last he had a spontaneous hematoma of his thigh and Xarelto was stopped.  Since his afib occurred in setting of acute illness, an event monitor was done which showed no further afib and he is now off Amio as well.    He is here today for followup and is doing well.  He denies any chest pain or pressure, SOB, DOE, PND, orthopnea, LE edema, dizziness, palpitations or syncope. He is compliant with his meds and is tolerating meds with no SE.  He is  doing well with his CPAP device and thinks that he has gotten used to it.  He tolerates the mask and feels the pressure is adequate.  Since going on CPAP he feels rested in the am and has no significant daytime sleepiness.  He denies any significant mouth or nasal dryness or nasal congestion.  He does not think that he snores.    The patient does not have symptoms concerning for COVID-19 infection (fever, chills, cough, or new shortness of breath).    Prior CV studies:   The following studies were reviewed today:  none  Past Medical History:  Diagnosis Date  . Allergic rhinitis   . Arthritis    SPINAL AND JOINTS  . Bladder polyp   . BPH (benign prostatic hypertrophy)   . Chronic back pain   . Diverticulosis   . Dyslipidemia    per pt questionable  . ED (erectile dysfunction) of organic origin   . GERD (gastroesophageal reflux disease)   . Glaucoma    BOTH EYES  . History of colon polyps   . History of gastritis   . History of kidney stones    many yrs ago  . 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 History of shingles   .  Lower urinary tract symptoms (LUTS)   . OSA on CPAP    very severe per study 01-31-2006  . Peyronie's disease   . S/P insertion of spinal cord stimulator    2010  . Tingling    right arm;takes Gabapentin  . Type 2 diabetes mellitus ([place])    Past Surgical History:  Procedure Laterality Date  . ANTERIOR CERVICAL DECOMP/DISCECTOMY FUSION  06-16-2004   C3 -- C5  . APPENDECTOMY  age 52  . CATARACT EXTRACTION W/ INTRAOCULAR LENS  IMPLANT, BILATERAL    . COLONOSCOPY    . CYSTOSCOPY WITH BIOPSY N/A 07/15/2015   Procedure: CYSTOSCOPY WITH BLADDER AND PROSTATE BIOPSY;  Surgeon: Festus Aloe, MD;  Location: [facility];  Service: Urology;  Laterality: N/A;  . HAND SURGERY Right 2017   "right hand middle finger knuckle replaced"  . INGUINAL HERNIA REPAIR Bilateral 1970's  . NASAL SEPTUM  SURGERY  yrs ago  . PENILE PROSTHESIS IMPLANT  11-26-2007  . PERMANANT SPINAL CORD STIMULATOR IMPLANT VIA THORACIC LAMINECTOMY  05-12-2009   Medtronic generator (left buttock)  . POSTERIOR LAMINECTOMY / DECOMPRESSION CERVICAL SPINE  03-08-2005   C3  -- C6  . RETINAL DETACHMENT SURGERY Right   . REVERSE SHOULDER ARTHROPLASTY Left 09/23/2015   Procedure: LEFT REVERSE TOTAL SHOULDER ARTHROPLASTY;  Surgeon: Netta Cedars, MD;  Location: [place];  Service: Orthopedics;  Laterality: Left;  . REVISION TOTAL ARTHROPLASTY RIGHT SHOULDER  07-22-2009  . RIGHT HEEL SURGERY     spurs  . RIGHT SHOULDER HEMIARTHROPLASTY  08-20-2008  . ROTATOR CUFF REPAIR Bilateral x1 right/  x2  left - last one 2013  . SHOULDER OPEN ROTATOR CUFF REPAIR Bilateral   . SPINAL CORD STIMULATOR BATTERY EXCHANGE N/A 07/29/2018   Procedure: Spinal cord stimulator/implantable pulse generator battery change;  Surgeon: Erline Levine, MD;  Location: [place];  Service: Neurosurgery;  Laterality: N/A;  Spinal cord stimulator/implantable pulse generator battery change  . TONSILLECTOMY    . TOTAL KNEE ARTHROPLASTY Right 04/17/2013   Procedure: RIGHT TOTAL KNEE ARTHROPLASTY;  Surgeon: Augustin Schooling, MD;  Location: [place];  Service: Orthopedics;  Laterality: Right;  . TOTAL KNEE ARTHROPLASTY Left 02-10-2009     Current Meds  Medication Sig  . ARTIFICIAL TEAR SOLUTION OP Place 1 drop into both eyes daily as needed (dry eyes).  Marland Kitchen aspirin EC 81 MG tablet Take 1 tablet (81 mg total) by mouth daily.  Marland Kitchen atorvastatin (LIPITOR) 10 MG tablet Take 1 tablet (10 mg total) by mouth daily.  . brimonidine (ALPHAGAN) 0.15 % ophthalmic solution Place 1 drop into both eyes 2 (two) times daily.  . cetirizine (ZYRTEC) 10 MG tablet Take 10 mg by mouth daily.  . fluticasone (FLONASE) 50 MCG/ACT nasal spray Place 1 spray into both nostrils at bedtime.  . furosemide (LASIX) 20 MG tablet Take 1 tablet (20 mg total) by mouth every other day. 10mg  every other day  .  gabapentin (NEURONTIN) 300 MG capsule Take 600 mg by mouth at bedtime.  Marland Kitchen GLIPIZIDE PO Take 0.5 tablets by mouth daily.  . metFORMIN (GLUCOPHAGE) 500 MG tablet Take 1,000 mg by mouth 2 (two) times daily with a meal.   . Metoprolol Succinate 25 MG CS24 Take 25 mg by mouth daily.  . pantoprazole (PROTONIX) 40 MG tablet Take 40 mg by mouth daily.  . potassium chloride (K-DUR) 10 MEQ tablet Take 1 tablet (10 mEq total) by mouth daily.  Marland Kitchen rOPINIRole (REQUIP) 1 MG tablet Take 1 mg by  mouth at bedtime.    . vitamin B-12 (CYANOCOBALAMIN) 500 MCG tablet Take 750 mcg by mouth daily.      Allergies:   Patient has no known allergies.   Social History   Tobacco Use  . Smoking status: Former Smoker    Packs/day: 1.00    Years: 15.00    Pack years: 15.00    Types: Cigarettes    Quit date: 07/08/1971    Years since quitting: 48.5  . Smokeless tobacco: Never Used  Substance Use Topics  . Alcohol use: Yes    Comment: rare  . Drug use: No     Family Hx: The patient's family history is not on file.  ROS:   Please see the history of present illness.     All other systems reviewed and are negative.   Labs/Other Tests and Data Reviewed:    Recent Labs: 10/16/2019: ALT 16; BUN 16; Creatinine, Ser 1.06; Hemoglobin 12.8; Platelets 232; Potassium 4.2; Sodium 141; TSH 1.850   Recent Lipid Panel Lab Results  Component Value Date/Time   CHOL 120 03/24/2019 09:18 AM   TRIG 125 03/24/2019 09:18 AM   HDL 49 03/24/2019 09:18 AM   CHOLHDL 2.4 03/24/2019 09:18 AM   CHOLHDL 3.1 10/04/2018 01:32 PM   LDLCALC 46 03/24/2019 09:18 AM    Wt Readings from Last 3 Encounters:  01/27/20 261 lb 7.5 oz (118.6 kg)  10/16/19 254 lb 6.4 oz (115.4 kg)  08/12/19 250 lb (113.4 kg)     Objective:    Vital Signs:  BP 122/68   Pulse 60   Ht 6' (1.829 m)   Wt 261 lb 7.5 oz (118.6 kg)   SpO2 94%   BMI 35.46 kg/m    GEN: Well nourished, well developed in no acute distress HEENT: Normal NECK: No JVD; No  carotid bruits LYMPHATICS: No lymphadenopathy CARDIAC:RRR, no murmurs, rubs, gallops RESPIRATORY:  Clear to auscultation without rales, wheezing or rhonchi  ABDOMEN: Soft, non-tender, non-distended MUSCULOSKELETAL:  No edema; No deformity  SKIN: Warm and dry NEUROLOGIC:  Alert and oriented x 3 PSYCHIATRIC:  Normal affect     ASSESSMENT & PLAN:    1.  Paroxysmal atrial fibrillation  -this occurred in the setting of acute illness -he is maintaining NSR on exam and denies any palpitations since I saw him last -continue Toprol XL 25mg  daily -Xarelto was stopped due to spontaneous thigh hematoma -continue ASA -event monitor showed no recurrent afib  2.  Coronary artery calcifications  -no ischemia on nuclear stress test 11/2018  -he has not had any anginal sx -continue statin -no ASA due to DOAC  3.  Hyperlipidemia  -LDL goal < 70 due to coronary artery calcifications.   -LDL 59 in May 2021 -continue Atorvastatin 10mg  daily  4.  OSA - The PAP download was reviewed today and showed an AHI of 2.3/hr on auto PAP with 90% compliance in using more than 4 hours nightly.  The patient has been using and benefiting from PAP use and will continue to benefit from therapy.   5.  Type 2 DM  - followed by his PCP.  - Continue metformin 1000mg  BID.  Medication Adjustments/Labs and Tests Ordered: Current medicines are reviewed at length with the patient today.  Concerns regarding medicines are outlined above.  Tests Ordered: No orders of the defined types were placed in this encounter.  Medication Changes: No orders of the defined types were placed in this encounter.   Disposition:  Follow up  in 6 month(s)  Signed, Fransico Him, MD  01/27/2020 11:07 AM    [place]

## 2020-01-27 NOTE — Patient Instructions (Signed)

## 2020-02-07 IMAGING — CT CT ANGIO EXTREM LOW*R*
1 of 7 series · 12 of 33 positions shown · IV contrast (OMNI 350)
Comparison: None.

CLINICAL DATA: Right lower extremity pain and swelling, initial
encounter

EXAM:
CT ANGIOGRAPHY OF THE RIGHT LOWEREXTREMITY
TECHNIQUE: Multidetector CT imaging of the right lower extremitywas performed
using the standard protocol during bolus administration of
intravenous contrast. Multiplanar CT image reconstructions and MIPs
were obtained to evaluate the vascular anatomy.
CONTRAST:  100mL OMNIPAQUE IOHEXOL 350 MG/ML SOLN

[Series 6: cta runoff (id) · axial · 0.58mm/px · z∈[+167,+1298]mm · 12 of 447 slices shown]
[im 35/447  soft-tissue]
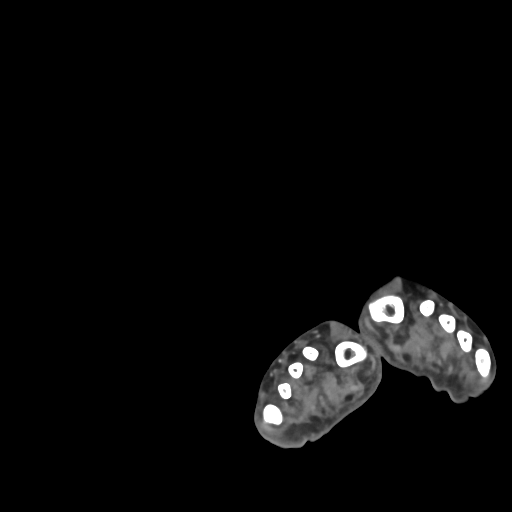
[im 69/447  bone]
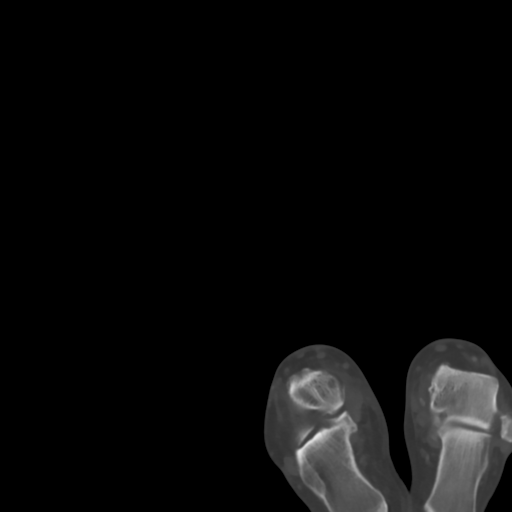
[im 103/447  soft-tissue]
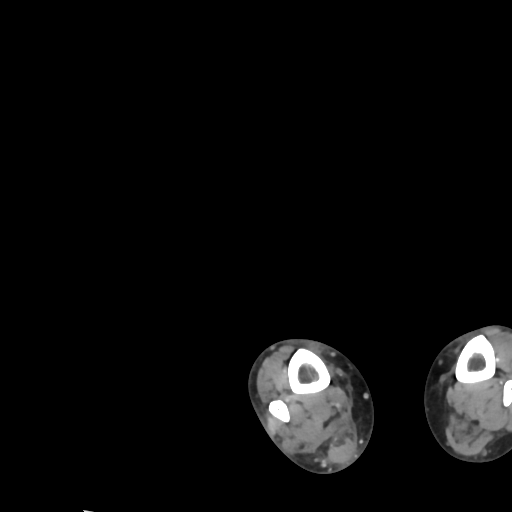
[im 138/447  bone]
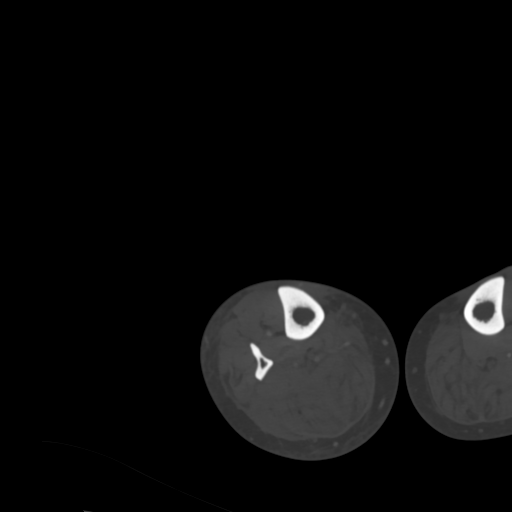
[im 172/447  soft-tissue]
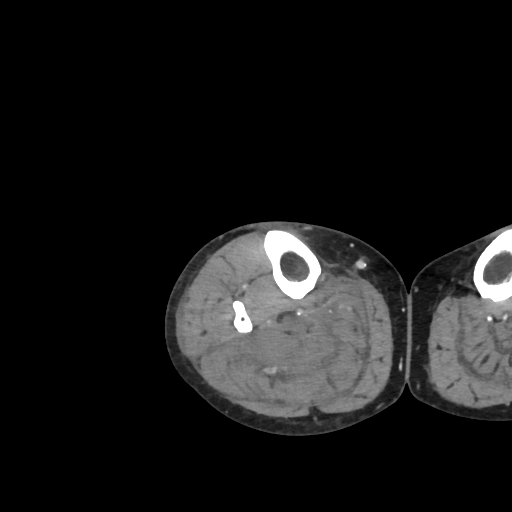
[im 206/447  bone]
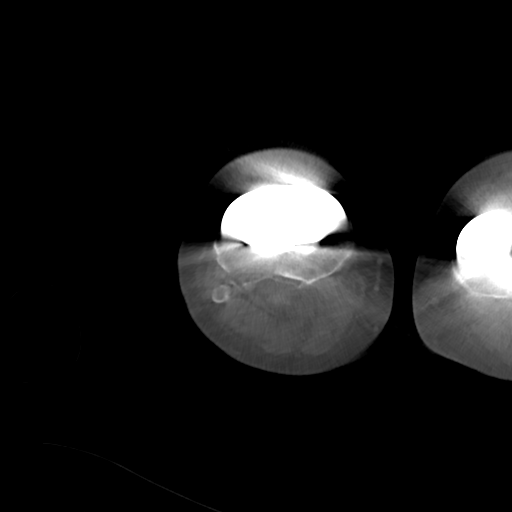
[im 241/447  soft-tissue]
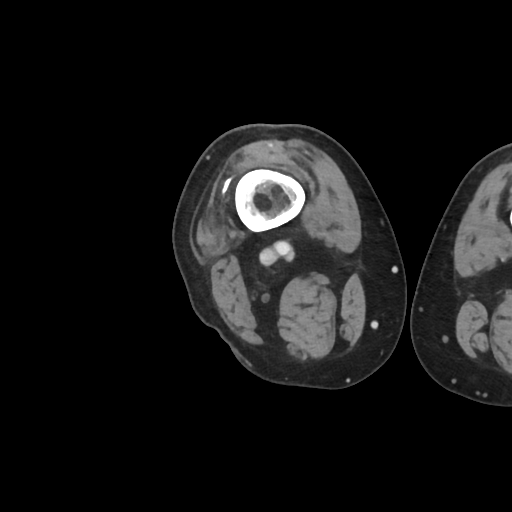
[im 275/447  bone]
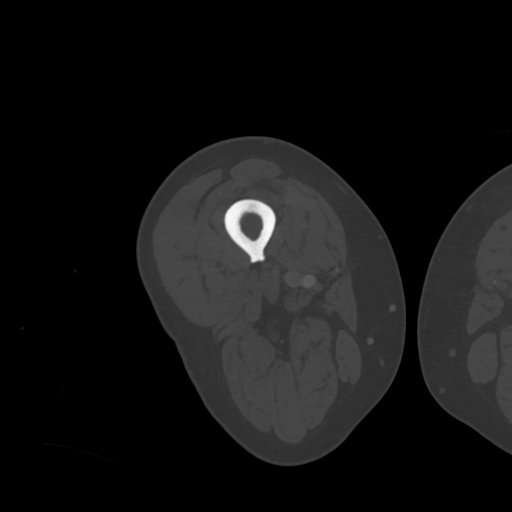
[im 309/447  soft-tissue]
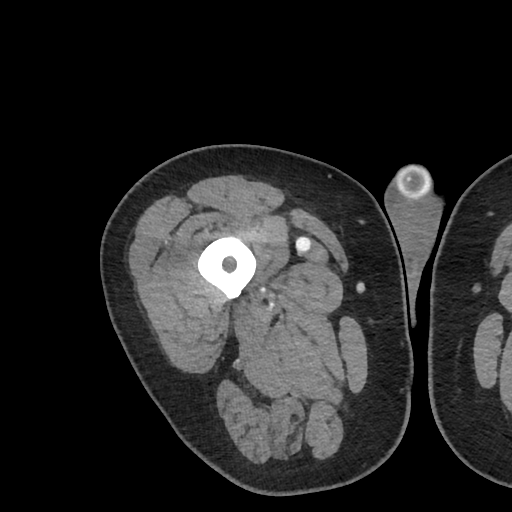
[im 344/447  bone]
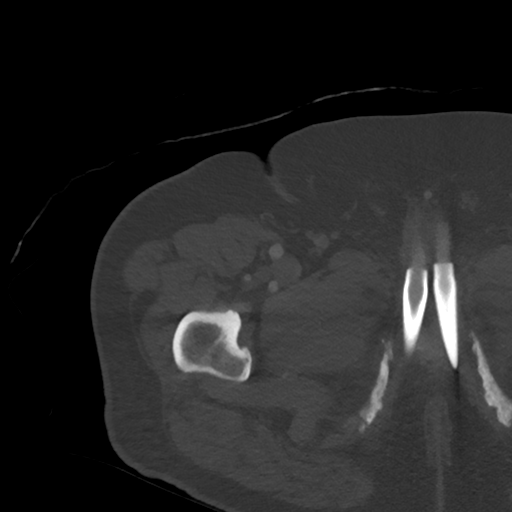
[im 378/447  soft-tissue]
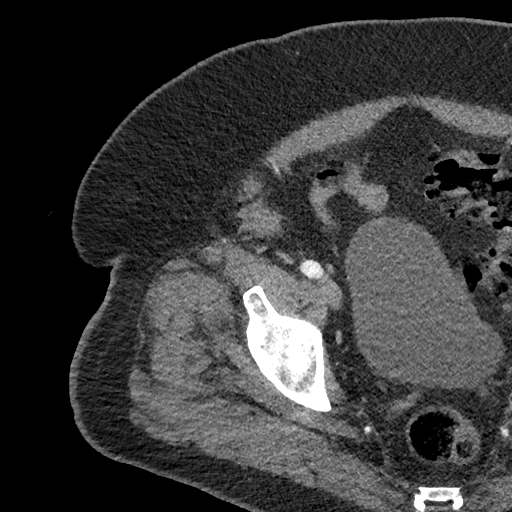
[im 412/447  bone]
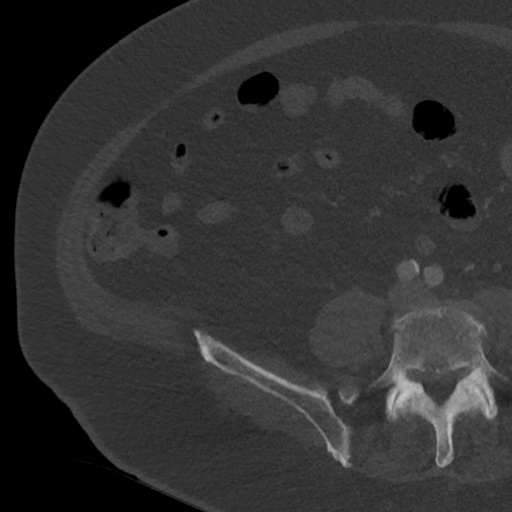

[12 of 33 positions shown; findings below may reference images not displayed]

FINDINGS: Vascular:

Distal abdominal aorta demonstrates atherosclerotic calcifications
although no aneurysmal dilatation is seen. Visualized portions of
the left iliac system are within normal limits.

Right common, external and internal iliac arteries are widely patent
with mild atherosclerotic calcifications. No filling defects or
focal stenoses are seen.

Right common femoral artery and femoral bifurcation are within
normal limits. The superficial femoral artery demonstrates mild
atherosclerotic calcifications without aneurysmal dilatation or
focal stenosis. The visualized portions of the popliteal artery are
widely patent although the midportion is obscured due to scatter
artifact from the patient's known knee replacement.

Right popliteal trifurcation shows patency of the vessels with
evidence of mild atherosclerotic calcifications. The vessels appear
patent to the level of the ankle with the anterior tibial and
posterior tibial arteries continuing into the foot. No focal
high-grade stenosis or filling defect to suggest embolism is seen.

No venous abnormality is noted.

Nonvascular: In the posterior musculature of the right calf there is
a 3.0 x 3.1 x 6.4 cm mildly hyperdense area with a fluid-fluid level
within. This likely represents a focal muscular hematoma just deep
to the soleus muscle within the flexor muscles of the calf. Patient
gives a history of Xarelto use. Correlation with any recent trauma
is recommended.

The remainder of the muscular structures of the lower extremity on
the right are noted. Known right knee replacement is seen. Changes
consistent with penile prosthesis are noted. The visualized portions
of the bladder and bowel are unremarkable. Degenerative changes of
lumbar spine are seen. No lower extremity bony abnormality is noted.

Review of the MIP images confirms the above findings.
IMPRESSION: Changes consistent with focal hematoma within the deep musculature
of the right calf as described. This is likely related to the
patient's known blood thinner use.

No evidence of arterial emboli or occlusive changes are seen. Normal
three-vessel runoff to the ankle on the right is noted.

## 2020-03-15 DIAGNOSIS — X32XXXD Exposure to sunlight, subsequent encounter: Secondary | ICD-10-CM | POA: Diagnosis not present

## 2020-03-15 DIAGNOSIS — L821 Other seborrheic keratosis: Secondary | ICD-10-CM | POA: Diagnosis not present

## 2020-03-15 DIAGNOSIS — L820 Inflamed seborrheic keratosis: Secondary | ICD-10-CM | POA: Diagnosis not present

## 2020-03-15 DIAGNOSIS — Z1283 Encounter for screening for malignant neoplasm of skin: Secondary | ICD-10-CM | POA: Diagnosis not present

## 2020-03-15 DIAGNOSIS — L570 Actinic keratosis: Secondary | ICD-10-CM | POA: Diagnosis not present

## 2020-04-04 ENCOUNTER — Other Ambulatory Visit: Payer: Self-pay

## 2020-04-04 ENCOUNTER — Ambulatory Visit (INDEPENDENT_AMBULATORY_CARE_PROVIDER_SITE_OTHER): Payer: Medicare Other | Admitting: Internal Medicine

## 2020-04-04 ENCOUNTER — Encounter: Payer: Self-pay | Admitting: Internal Medicine

## 2020-04-04 VITALS — BP 136/76 | HR 67 | Temp 98.7°F | Ht 72.0 in | Wt 257.4 lb

## 2020-04-04 DIAGNOSIS — I480 Paroxysmal atrial fibrillation: Secondary | ICD-10-CM

## 2020-04-04 DIAGNOSIS — G4733 Obstructive sleep apnea (adult) (pediatric): Secondary | ICD-10-CM

## 2020-04-04 DIAGNOSIS — I2510 Atherosclerotic heart disease of native coronary artery without angina pectoris: Secondary | ICD-10-CM

## 2020-04-04 NOTE — Patient Instructions (Signed)
Order- DME [employer] Please resume AirView or place SD card for downloads.  We can continue auto 5-20, mask of choice, humidifier, supplies,  Please call if we can help

## 2020-04-04 NOTE — Progress Notes (Signed)
Subjective:    Patient ID: Brian Mosses Sr., male    DOB: [birth year], 79 y.o.   MRN: [id number]  HPI male former smoker followed for OSA, restless legs, complicated by obesity, HBP, allergic rhinitis, GERD, glaucoma, DM 2 NPSG 01/11/06- AHI 104/ hr, desaturation to 68%  ------------------------------------------------------------------------------------------------------------   04/03/2019- 79 year old male former smoker followed for OSA, restless legs, complicated by obesity, HBP, allergic rhinitis, GERD, Glaucoma, DM 2, PAF, CAD, Hyperlipidemia CPAP auto 5-20/  APS Download- none available today. Last available from May showed 90% compliance, AHI 2.3/ hr. -----OSA on CPAP 5-20, DME: [facility]; no complaints, pt states he may be due for a new machine Body weight today- 250 lbs Hosp in Jan w LLL PNA, AFib>RSR now amiodarone, Xarelto. Describes smothering and chest pain with that illness. Breathing now baseline w/o cough/ wheeze.. Comfortable with CPAP, nasal mask. Discussed Covid signs and precautions- he is careful. CXR 11/12/2018 IMPRESSION: Small bilateral pleural effusions and overlying atelectasis. Mild vascular congestion without overt pulmonary edema.  04/04/20- 79 year old male former smoker followed for OSA, restless legs, complicated by obesity, HBP, allergic rhinitis, GERD, Glaucoma, DM 2, PAFib/amiodarone, CAD, Hyperlipidemia CPAP auto 5-20/  APS/[employer] Download- compliance 90%, AHI 2.2/ hr -----sleeping well, no problems with leaks. Hosp recently for R calf hematoma on Xarelto> changed to ASA. Body weight today 257 lbs Doing well with CPAP- expresess no concerns. Sleeps better. Machine is new in past year. Nasal mask. Has had 2 Moderna Covax  ROS-see HPI   + = positive Constitutional:    weight loss, night sweats, fevers, chills, fatigue, lassitude. HEENT:    headaches, difficulty swallowing, tooth/dental problems, sore throat,       sneezing, itching, ear ache, nasal  congestion, post nasal drip, snoring CV:    chest pain, orthopnea, PND, swelling in lower extremities, anasarca,                                  dizziness, palpitations Resp:   shortness of breath with exertion or at rest.                productive cough,   non-productive cough, coughing up of blood.              change in color of mucus.  wheezing.   Skin:    rash or lesions. GI:  No-   heartburn, indigestion, abdominal pain, nausea, vomiting, diarrhea,                 change in bowel habits, loss of appetite GU: dysuria, change in color of urine, no urgency or frequency.   flank pain. MS:   joint pain, stiffness, decreased range of motion, back pain. Neuro-     nothing unusual Psych:  change in mood or affect.  depression or anxiety.   memory loss.   Objective:  OBJ- Physical Exam General- Alert, Oriented, Affect-appropriate, Distress- none acute, +obese Skin- + scabs R forearm Lymphadenopathy- none Head- atraumatic            Eyes- Gross vision intact, PERRLA, conjunctivae and secretions clear            Ears- Hearing, canals-normal            Nose- clear, no-Septal dev, mucus, polyps, erosion, perforation             Throat- Mallampati III , mucosa clear , drainage- none, tonsils- atrophic Neck- flexible ,  trachea midline, no stridor , thyroid nl, carotid no bruit Chest - symmetrical excursion , unlabored           Heart/CV- RRR to exam , no murmur , no gallop  , no rub, nl s1 s2                           - JVD- none , edema- none, stasis changes- none, varices- none           Lung- clear to P&A, wheeze- none, cough- none , dullness-none, rub- none           Chest wall-  Abd-  Br/ Gen/ Rectal- Not done, not indicated Extrem- cyanosis- none, clubbing, none, atrophy- none, strength- nl Neuro- grossly intact to observation         Assessment & Plan:

## 2020-04-06 NOTE — Assessment & Plan Note (Signed)
Benefits from CPAP with improved sleep. Plan- continue auto 5-20

## 2020-04-06 NOTE — Assessment & Plan Note (Signed)
Rhythmis regular to exam at this visit. Follows with cardiology.

## 2020-05-04 DIAGNOSIS — M160 Bilateral primary osteoarthritis of hip: Secondary | ICD-10-CM | POA: Diagnosis not present

## 2020-05-04 DIAGNOSIS — M25552 Pain in left hip: Secondary | ICD-10-CM | POA: Diagnosis not present

## 2020-05-10 DIAGNOSIS — X32XXXD Exposure to sunlight, subsequent encounter: Secondary | ICD-10-CM | POA: Diagnosis not present

## 2020-05-10 DIAGNOSIS — C4441 Basal cell carcinoma of skin of scalp and neck: Secondary | ICD-10-CM | POA: Diagnosis not present

## 2020-05-10 DIAGNOSIS — L570 Actinic keratosis: Secondary | ICD-10-CM | POA: Diagnosis not present

## 2020-05-11 DIAGNOSIS — M25552 Pain in left hip: Secondary | ICD-10-CM | POA: Diagnosis not present

## 2020-05-26 DIAGNOSIS — M5136 Other intervertebral disc degeneration, lumbar region: Secondary | ICD-10-CM | POA: Diagnosis not present

## 2020-07-13 DIAGNOSIS — Z23 Encounter for immunization: Secondary | ICD-10-CM | POA: Diagnosis not present

## 2020-07-19 ENCOUNTER — Other Ambulatory Visit: Payer: Self-pay | Admitting: Cardiology

## 2020-07-19 DIAGNOSIS — Z85828 Personal history of other malignant neoplasm of skin: Secondary | ICD-10-CM | POA: Diagnosis not present

## 2020-07-19 DIAGNOSIS — X32XXXD Exposure to sunlight, subsequent encounter: Secondary | ICD-10-CM | POA: Diagnosis not present

## 2020-07-19 DIAGNOSIS — L570 Actinic keratosis: Secondary | ICD-10-CM | POA: Diagnosis not present

## 2020-07-19 DIAGNOSIS — Z08 Encounter for follow-up examination after completed treatment for malignant neoplasm: Secondary | ICD-10-CM | POA: Diagnosis not present

## 2020-07-20 MED ORDER — FUROSEMIDE 20 MG PO TABS: 20.0000 mg | ORAL_TABLET | ORAL | 1 refills | Status: DC

## 2020-07-26 ENCOUNTER — Other Ambulatory Visit: Payer: Self-pay

## 2020-07-26 ENCOUNTER — Ambulatory Visit (INDEPENDENT_AMBULATORY_CARE_PROVIDER_SITE_OTHER): Payer: Medicare Other | Admitting: Cardiology

## 2020-07-26 VITALS — BP 142/78 | HR 76 | Ht 72.0 in | Wt 260.4 lb

## 2020-07-26 DIAGNOSIS — E1122 Type 2 diabetes mellitus with diabetic chronic kidney disease: Secondary | ICD-10-CM

## 2020-07-26 DIAGNOSIS — I2510 Atherosclerotic heart disease of native coronary artery without angina pectoris: Secondary | ICD-10-CM

## 2020-07-26 DIAGNOSIS — N181 Chronic kidney disease, stage 1: Secondary | ICD-10-CM

## 2020-07-26 DIAGNOSIS — G4733 Obstructive sleep apnea (adult) (pediatric): Secondary | ICD-10-CM | POA: Diagnosis not present

## 2020-07-26 DIAGNOSIS — I480 Paroxysmal atrial fibrillation: Secondary | ICD-10-CM

## 2020-07-26 DIAGNOSIS — E7800 Pure hypercholesterolemia, unspecified: Secondary | ICD-10-CM | POA: Diagnosis not present

## 2020-07-26 NOTE — Patient Instructions (Signed)
Medication Instructions:  Your physician recommends that you continue on your current medications as directed. Please refer to the Current Medication list given to you today.  *If you need a refill on your cardiac medications before your next appointment, please call your pharmacy*   Lab Work: Fasting lipids and ALT If you have labs (blood work) drawn today and your tests are completely normal, you will receive your results only by: Marland Kitchen MyChart Message (if you have MyChart) OR . A paper copy in the mail If you have any lab test that is abnormal or we need to change your treatment, we will call you to review the results.  Follow-Up: At [facility], you and your health needs are our priority.  As part of our continuing mission to provide you with exceptional heart care, we have created designated Provider Care Teams.  These Care Teams include your primary Cardiologist (physician) and Advanced Practice Providers (APPs -  Physician Assistants and Nurse Practitioners) who all work together to provide you with the care you need, when you need it.  Your next appointment:   1 year(s)  The format for your next appointment:   In Person  Provider:   You may see Fransico Him, MD or one of the following Advanced Practice Providers on your designated Care Team:    Melina Copa, PA-C  Ermalinda Barrios, PA-C    Other Instructions You have been referred to see PharmD in the [facility]

## 2020-07-26 NOTE — Addendum Note (Signed)
Addended by: Antonieta Iba on: 07/26/2020 10:14 AM   Modules accepted: Orders

## 2020-07-26 NOTE — Progress Notes (Signed)
Date:  07/26/2020   ID:  Brian Ellison, DOB 11/03/1940, MRN [id number]   PCP:  [facility]  Cardiologist:  Fransico Him, MD  Electrophysiologist:  None   Chief Complaint:  OSA, afib, Coronary artery calcifications  History of Present Illness:     This is a 79yo male with a history of Dm, OSA and hyperlipidemia who was admitted to [facility] 10/04/2018 with complaints of CP. Chest CT neg for PE but showed mild 3v CAD. He was dx with LLL PNA by chest CT treated with antibx. He then developed chills, rigors and fever and developed afib with RVR treated with Lopressor 12.5mg  BID. Echo showed normal LVF with no signficant valvular heart dz. There was mild LAE. TSH was normal. He was started on Xarelto 20mg  daily for CHADS2VAS score of 3. He converted back to NSR the next day.   He was seen back 10/2018 and was back in atrial flutter and was referred to afib clinic.  He did not have any further CP which was felt to be related to PNA in hospital. In afib clinic, therapeutic options including Tikosyn, amiodarone, vs rate control. He did not want Tikosyn because his wife had an episode of torsades de pointes while being loaded on the medication. He wanted to take some time to consider amiodarone after discussing risks and benefits of the medication.   He later decided to start on Amio and was loaded on 200mg  BID and converted to NSR at next afib clinic.  His Amio was decreased to 200mg  daily.  Due to ongoing DOE , Nuclear stress test was done which showed no ischemia.  He also had a  spontaneous hematoma of his thigh and Xarelto was stopped.  Since his afib occurred in setting of acute illness, an event monitor was done which showed no further afib and he is now off Amio as well.    He is here today for followup and is doing well.  He denies any chest pain or pressure, SOB, DOE, PND, orthopnea, LE edema, dizziness, palpitations or syncope. He recently has started having severe body  aches in all his joints and is currently getting blood workup by the [place] and is hopeful that he will be set up with a Rheumatologist. He was taken off lipitor in case his sx were from the statin but his sx did not improve. He is compliant with his meds and is tolerating meds with no SE.    Prior CV studies:   The following studies were reviewed today:  none  Past Medical History:  Diagnosis Date  . Allergic rhinitis   . Arthritis    SPINAL AND JOINTS  . Bladder polyp   . BPH (benign prostatic hypertrophy)   . Chronic back pain   . Diverticulosis   . Dyslipidemia    per pt questionable  . ED (erectile dysfunction) of organic origin   . GERD (gastroesophageal reflux disease)   . Glaucoma    BOTH EYES  . History of colon polyps   . History of gastritis   . History of kidney stones    many yrs ago  . 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 History of shingles   . Lower urinary tract symptoms (LUTS)   . OSA on CPAP    very severe per study 01-31-2006  . Peyronie's disease   . S/P insertion of spinal cord stimulator  2010  . Tingling    right arm;takes Gabapentin  . Type 2 diabetes mellitus ([place])    Past Surgical History:  Procedure Laterality Date  . ANTERIOR CERVICAL DECOMP/DISCECTOMY FUSION  06-16-2004   C3 -- C5  . APPENDECTOMY  age 64  . CATARACT EXTRACTION W/ INTRAOCULAR LENS  IMPLANT, BILATERAL    . COLONOSCOPY    . CYSTOSCOPY WITH BIOPSY N/A 07/15/2015   Procedure: CYSTOSCOPY WITH BLADDER AND PROSTATE BIOPSY;  Surgeon: Festus Aloe, MD;  Location: [facility];  Service: Urology;  Laterality: N/A;  . HAND SURGERY Right 2017   "right hand middle finger knuckle replaced"  . INGUINAL HERNIA REPAIR Bilateral 1970's  . NASAL SEPTUM SURGERY  yrs ago  . PENILE PROSTHESIS IMPLANT  11-26-2007  . PERMANANT SPINAL CORD STIMULATOR IMPLANT VIA THORACIC LAMINECTOMY  05-12-2009   Medtronic generator (left  buttock)  . POSTERIOR LAMINECTOMY / DECOMPRESSION CERVICAL SPINE  03-08-2005   C3  -- C6  . RETINAL DETACHMENT SURGERY Right   . REVERSE SHOULDER ARTHROPLASTY Left 09/23/2015   Procedure: LEFT REVERSE TOTAL SHOULDER ARTHROPLASTY;  Surgeon: Netta Cedars, MD;  Location: [place];  Service: Orthopedics;  Laterality: Left;  . REVISION TOTAL ARTHROPLASTY RIGHT SHOULDER  07-22-2009  . RIGHT HEEL SURGERY     spurs  . RIGHT SHOULDER HEMIARTHROPLASTY  08-20-2008  . ROTATOR CUFF REPAIR Bilateral x1 right/  x2  left - last one 2013  . SHOULDER OPEN ROTATOR CUFF REPAIR Bilateral   . SPINAL CORD STIMULATOR BATTERY EXCHANGE N/A 07/29/2018   Procedure: Spinal cord stimulator/implantable pulse generator battery change;  Surgeon: Erline Levine, MD;  Location: [place];  Service: Neurosurgery;  Laterality: N/A;  Spinal cord stimulator/implantable pulse generator battery change  . TONSILLECTOMY    . TOTAL KNEE ARTHROPLASTY Right 04/17/2013   Procedure: RIGHT TOTAL KNEE ARTHROPLASTY;  Surgeon: Augustin Schooling, MD;  Location: [place];  Service: Orthopedics;  Laterality: Right;  . TOTAL KNEE ARTHROPLASTY Left 02-10-2009     Current Meds  Medication Sig  . aspirin EC 81 MG tablet Take 1 tablet (81 mg total) by mouth daily.  . brimonidine (ALPHAGAN) 0.15 % ophthalmic solution Place 1 drop into both eyes 2 (two) times daily.  . cetirizine (ZYRTEC) 10 MG tablet Take 10 mg by mouth daily.  . Cholecalciferol (VITAMIN D3 PO) Take 1 capsule by mouth daily.  . furosemide (LASIX) 20 MG tablet Take 1 tablet (20 mg total) by mouth every other day. 10mg  every other day  . gabapentin (NEURONTIN) 300 MG capsule Take 600 mg by mouth at bedtime.  Marland Kitchen GLIPIZIDE PO Take 5 mg by mouth daily.   . metFORMIN (GLUCOPHAGE) 500 MG tablet Take 1,000 mg by mouth 2 (two) times daily with a meal.   . Metoprolol Succinate 25 MG CS24 Take 25 mg by mouth daily.  . pantoprazole (PROTONIX) 40 MG tablet Take 40 mg by mouth daily.  . potassium chloride  (K-DUR) 10 MEQ tablet Take 1 tablet (10 mEq total) by mouth daily.  Marland Kitchen rOPINIRole (REQUIP) 1 MG tablet Take 1 mg by mouth at bedtime.    . vitamin B-12 (CYANOCOBALAMIN) 500 MCG tablet Take 750 mcg by mouth daily.      Allergies:   Patient has no known allergies.   Social History   Tobacco Use  . Smoking status: Former Smoker    Packs/day: 1.00    Years: 15.00    Pack years: 15.00    Types: Cigarettes    Quit  date: 07/08/1971    Years since quitting: 49.0  . Smokeless tobacco: Never Used  Vaping Use  . Vaping Use: Never used  Substance Use Topics  . Alcohol use: Yes    Comment: rare  . Drug use: No     Family Hx: The patient's family history is not on file.  ROS:   Please see the history of present illness.     All other systems reviewed and are negative.   Labs/Other Tests and Data Reviewed:    Recent Labs: 10/16/2019: ALT 16; BUN 16; Creatinine, Ser 1.06; Hemoglobin 12.8; Platelets 232; Potassium 4.2; Sodium 141; TSH 1.850   Recent Lipid Panel Lab Results  Component Value Date/Time   CHOL 120 03/24/2019 09:18 AM   TRIG 125 03/24/2019 09:18 AM   HDL 49 03/24/2019 09:18 AM   CHOLHDL 2.4 03/24/2019 09:18 AM   CHOLHDL 3.1 10/04/2018 01:32 PM   LDLCALC 46 03/24/2019 09:18 AM    Wt Readings from Last 3 Encounters:  07/26/20 260 lb 6.4 oz (118.1 kg)  04/04/20 (!) 257 lb 6.4 oz (116.8 kg)  01/27/20 261 lb 7.5 oz (118.6 kg)     Objective:    Vital Signs:  BP (!) 142/78   Pulse 76   Ht 6' (1.829 m)   Wt 260 lb 6.4 oz (118.1 kg)   SpO2 92%   BMI 35.32 kg/m    GEN: Well nourished, well developed in no acute distress HEENT: Normal NECK: No JVD; No carotid bruits LYMPHATICS: No lymphadenopathy CARDIAC:RRR, no murmurs, rubs, gallops RESPIRATORY:  Clear to auscultation without rales, wheezing or rhonchi  ABDOMEN: Soft, non-tender, non-distended MUSCULOSKELETAL:  No edema; No deformity  SKIN: Warm and dry NEUROLOGIC:  Alert and oriented x 3 PSYCHIATRIC:   Normal affect     ASSESSMENT & PLAN:    1.  Paroxysmal atrial fibrillation  -this occurred in the setting of acute illness and has not had any further episodes and Amio was stopped -he continues to maintain NSR with no palpitations -continue Toprol XL 25mg  daily -Xarelto was stopped due to spontaneous thigh hematoma -continue ASA  2.  Coronary artery calcifications  -no ischemia on nuclear stress test 11/2018  -he denies any anginal sx -he is off statin due to severe joint aches -no ASA due to DOAC  3.  Hyperlipidemia  -LDL goal < 70 due to coronary artery calcifications.   -LDL 46 in May 2020 -currently off statin due to diffuse joint aches -will refer to lipid clinic -repeat FLP and ALT  4. OSA  -followed by Dr. Annamaria Boots  5.  Type 2 DM  - followed by his PCP.  - Continue metformin 1000mg  BID.  Medication Adjustments/Labs and Tests Ordered: Current medicines are reviewed at length with the patient today.  Concerns regarding medicines are outlined above.  Tests Ordered: Orders Placed This Encounter  Procedures  . EKG 12-Lead   Medication Changes: No orders of the defined types were placed in this encounter.   Disposition:  Follow up in 6 month(s)  Signed, Fransico Him, MD  07/26/2020 10:06 AM    [place]

## 2020-07-29 ENCOUNTER — Other Ambulatory Visit: Payer: Self-pay

## 2020-07-29 ENCOUNTER — Other Ambulatory Visit: Payer: Medicare Other | Admitting: *Deleted

## 2020-07-29 DIAGNOSIS — E7800 Pure hypercholesterolemia, unspecified: Secondary | ICD-10-CM | POA: Diagnosis not present

## 2020-07-29 LAB — LIPID PANEL
Chol/HDL Ratio: 4.1 ratio (ref 0.0–5.0)
Cholesterol, Total: 173 mg/dL (ref 100–199)
HDL: 42 mg/dL (ref >39)
LDL Chol Calc (NIH): 105 mg/dL — ABNORMAL HIGH (ref 0–99)
Triglycerides: 148 mg/dL (ref 0–149)
VLDL Cholesterol Cal: 26 mg/dL (ref 5–40)

## 2020-07-29 LAB — ALT: ALT: 13 IU/L (ref 0–44)

## 2020-08-03 ENCOUNTER — Other Ambulatory Visit: Payer: Self-pay

## 2020-08-03 ENCOUNTER — Ambulatory Visit (INDEPENDENT_AMBULATORY_CARE_PROVIDER_SITE_OTHER): Payer: Medicare Other | Admitting: Pharmacist

## 2020-08-03 DIAGNOSIS — G720 Drug-induced myopathy: Secondary | ICD-10-CM | POA: Diagnosis not present

## 2020-08-03 DIAGNOSIS — E7800 Pure hypercholesterolemia, unspecified: Secondary | ICD-10-CM | POA: Diagnosis not present

## 2020-08-03 DIAGNOSIS — T466X5A Adverse effect of antihyperlipidemic and antiarteriosclerotic drugs, initial encounter: Secondary | ICD-10-CM

## 2020-08-03 MED ORDER — ROSUVASTATIN CALCIUM 10 MG PO TABS: 10.0000 mg | ORAL_TABLET | Freq: Every day | ORAL | 11 refills | Status: DC

## 2020-08-03 NOTE — Patient Instructions (Addendum)
It was nice to meet you today!  Your LDL cholesterol is 105 and your goal is < 70  Start taking rosuvastatin 10mg  - 1 tablet once a day  Try to limit your eggs, bacon and sausage to 1 day a week  We will recheck labs in 3 months. Come in any time after 7:30am for fasting labs   Call [facility] with trouble tolerating your medicine [phone]

## 2020-08-03 NOTE — Progress Notes (Signed)
Patient ID: Brian Ellison                 DOB: 1940-10-16                    MRN: [id number]     HPI: Brian Ellison is a 79 y.o. male patient referred to lipid clinic by Dr Radford Pax. PMH is significant for DM, HLD, DM, OSA, 3 vessel mild CAD, and afib. At last visit with Dr Radford Pax 11/16, pt had reported joint pain and was getting a workup by the [facility].   Pt presents today in good spirits. He recalls taking atorvastatin many years ago which caused muscle pain in his arms after about 1 week of therapy. His symptoms did resolve after he stopped statin therapy. He has not been on another statin for the past few years. Reports his joint pain started about 1 month ago. He has an appt with rheumatology, could not get an appt with the [place].  Current Medications: none Intolerances: atorvastatin 10mg  daily - myalgias, simvastatin 10mg  daily - on med list but pt does not recall, potentially myalgias, Lovaza 1g BID Risk Factors: CAD, HTN, DM LDL goal: 70mg /dL  Diet: Breakfast - 2 eggs, bacon, toast, or sausage a few days a week, otherwise will have bagel. Sandwich for lunch. Dinner - meat (chicken or [place]) and vegetables. Tries to limit snacking and red meat. Drinks diet soda or coffee.  Exercise: Limited due to ongoing joint pain. Typically stays busy with housework.   Social History: Former smoker 1 PPD for 15 years, quit in 1972. Rarely drinks alcohol, denies illicit drug use.  Labs: 07/29/20: TC 173, TG 148, HDL 42, LDL 105 (no lipid lowering therapy)  Past Medical History:  Diagnosis Date  . Allergic rhinitis   . Arthritis    SPINAL AND JOINTS  . Bladder polyp   . BPH (benign prostatic hypertrophy)   . Chronic back pain   . Diverticulosis   . Dyslipidemia    per pt questionable  . ED (erectile dysfunction) of organic origin   . GERD (gastroesophageal reflux disease)   . Glaucoma    BOTH EYES  . History of colon polyps   . History of gastritis   . History of kidney stones    many yrs  ago  . 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 History of shingles   . Lower urinary tract symptoms (LUTS)   . OSA on CPAP    very severe per study 01-31-2006  . Peyronie's disease   . S/P insertion of spinal cord stimulator    2010  . Tingling    right arm;takes Gabapentin  . Type 2 diabetes mellitus ([place])     Current Outpatient Medications on File Prior to Visit  Medication Sig Dispense Refill  . aspirin EC 81 MG tablet Take 1 tablet (81 mg total) by mouth daily. 150 tablet 2  . brimonidine (ALPHAGAN) 0.15 % ophthalmic solution Place 1 drop into both eyes 2 (two) times daily.    . cetirizine (ZYRTEC) 10 MG tablet Take 10 mg by mouth daily.    . Cholecalciferol (VITAMIN D3 PO) Take 1 capsule by mouth daily.    . furosemide (LASIX) 20 MG tablet Take 1 tablet (20 mg total) by mouth every other day. 10mg  every other day 90 tablet 1  . gabapentin (NEURONTIN) 300 MG capsule Take 600 mg by mouth  at bedtime.    Marland Kitchen GLIPIZIDE PO Take 5 mg by mouth daily.     . metFORMIN (GLUCOPHAGE) 500 MG tablet Take 1,000 mg by mouth 2 (two) times daily with a meal.     . Metoprolol Succinate 25 MG CS24 Take 25 mg by mouth daily. 90 capsule 3  . pantoprazole (PROTONIX) 40 MG tablet Take 40 mg by mouth daily.    . potassium chloride (K-DUR) 10 MEQ tablet Take 1 tablet (10 mEq total) by mouth daily. 90 tablet 3  . rOPINIRole (REQUIP) 1 MG tablet Take 1 mg by mouth at bedtime.      . vitamin B-12 (CYANOCOBALAMIN) 500 MCG tablet Take 750 mcg by mouth daily.      No current facility-administered medications on file prior to visit.    No Known Allergies  Assessment/Plan:  1. Hyperlipidemia - Baseline LDL 105 above goal < 70 due to CAD. Pt is intolerant to atorvastatin 10mg  daily (tried this years ago). Currently dealing with joint pain that started 1 month ago (not on any lipid lowering therapy during this time) - he is seeing rheumatology next month for a  work-up. Will start rosuvastatin 10mg  daily and recheck fasting labs in 3 months. Also discussed heart healthy diet and encouraged him to decrease intake of eggs, sausage, and bacon. Pt advised to call clinic with any trouble tolerating therapy - could try lower dose/frequency of rosuvastatin, pravastatin, or Zetia at that time.  Daniell Paradise E. Bina Veenstra, PharmD, BCACP, [facility] [address], [place], [place] [address] Phone: [phone]; Fax: [phone] 08/03/2020 10:16 AM

## 2020-08-17 DIAGNOSIS — M064 Inflammatory polyarthropathy: Secondary | ICD-10-CM | POA: Diagnosis not present

## 2020-08-17 DIAGNOSIS — Z6835 Body mass index (BMI) 35.0-35.9, adult: Secondary | ICD-10-CM | POA: Diagnosis not present

## 2020-08-17 DIAGNOSIS — E669 Obesity, unspecified: Secondary | ICD-10-CM | POA: Diagnosis not present

## 2020-08-17 DIAGNOSIS — R5383 Other fatigue: Secondary | ICD-10-CM | POA: Diagnosis not present

## 2020-08-17 DIAGNOSIS — M150 Primary generalized (osteo)arthritis: Secondary | ICD-10-CM | POA: Diagnosis not present

## 2020-08-17 DIAGNOSIS — M2550 Pain in unspecified joint: Secondary | ICD-10-CM | POA: Diagnosis not present

## 2020-09-14 DIAGNOSIS — E669 Obesity, unspecified: Secondary | ICD-10-CM | POA: Diagnosis not present

## 2020-09-14 DIAGNOSIS — M150 Primary generalized (osteo)arthritis: Secondary | ICD-10-CM | POA: Diagnosis not present

## 2020-09-14 DIAGNOSIS — M064 Inflammatory polyarthropathy: Secondary | ICD-10-CM | POA: Diagnosis not present

## 2020-09-14 DIAGNOSIS — Z6835 Body mass index (BMI) 35.0-35.9, adult: Secondary | ICD-10-CM | POA: Diagnosis not present

## 2020-09-14 DIAGNOSIS — M0579 Rheumatoid arthritis with rheumatoid factor of multiple sites without organ or systems involvement: Secondary | ICD-10-CM | POA: Diagnosis not present

## 2020-09-30 DIAGNOSIS — M0579 Rheumatoid arthritis with rheumatoid factor of multiple sites without organ or systems involvement: Secondary | ICD-10-CM | POA: Diagnosis not present

## 2020-10-12 ENCOUNTER — Other Ambulatory Visit: Payer: Medicare Other | Admitting: *Deleted

## 2020-10-12 ENCOUNTER — Other Ambulatory Visit: Payer: Self-pay

## 2020-10-12 DIAGNOSIS — E7800 Pure hypercholesterolemia, unspecified: Secondary | ICD-10-CM

## 2020-10-12 LAB — LIPID PANEL
Chol/HDL Ratio: 2.6 ratio (ref 0.0–5.0)
Cholesterol, Total: 102 mg/dL (ref 100–199)
HDL: 40 mg/dL (ref >39)
LDL Chol Calc (NIH): 40 mg/dL (ref 0–99)
Triglycerides: 125 mg/dL (ref 0–149)
VLDL Cholesterol Cal: 22 mg/dL (ref 5–40)

## 2020-10-12 LAB — HEPATIC FUNCTION PANEL
ALT: 17 IU/L (ref 0–44)
AST: 15 IU/L (ref 0–40)
Albumin: 4.1 g/dL (ref 3.6–4.6)
Alkaline Phosphatase: 55 IU/L (ref 44–121)
Bilirubin Total: 0.3 mg/dL (ref 0.0–1.2)
Bilirubin, Direct: 0.12 mg/dL (ref 0.00–0.40)
Total Protein: 6.2 g/dL (ref 6.0–8.5)

## 2020-10-19 DIAGNOSIS — M25552 Pain in left hip: Secondary | ICD-10-CM | POA: Diagnosis not present

## 2020-10-28 DIAGNOSIS — M0579 Rheumatoid arthritis with rheumatoid factor of multiple sites without organ or systems involvement: Secondary | ICD-10-CM | POA: Diagnosis not present

## 2020-12-01 DIAGNOSIS — M5416 Radiculopathy, lumbar region: Secondary | ICD-10-CM | POA: Diagnosis not present

## 2020-12-13 DIAGNOSIS — Z6834 Body mass index (BMI) 34.0-34.9, adult: Secondary | ICD-10-CM | POA: Diagnosis not present

## 2020-12-13 DIAGNOSIS — Z79899 Other long term (current) drug therapy: Secondary | ICD-10-CM | POA: Diagnosis not present

## 2020-12-13 DIAGNOSIS — M2550 Pain in unspecified joint: Secondary | ICD-10-CM | POA: Diagnosis not present

## 2020-12-13 DIAGNOSIS — M0579 Rheumatoid arthritis with rheumatoid factor of multiple sites without organ or systems involvement: Secondary | ICD-10-CM | POA: Diagnosis not present

## 2020-12-13 DIAGNOSIS — M150 Primary generalized (osteo)arthritis: Secondary | ICD-10-CM | POA: Diagnosis not present

## 2020-12-13 DIAGNOSIS — E669 Obesity, unspecified: Secondary | ICD-10-CM | POA: Diagnosis not present

## 2020-12-14 DIAGNOSIS — H35372 Puckering of macula, left eye: Secondary | ICD-10-CM | POA: Diagnosis not present

## 2020-12-14 DIAGNOSIS — E119 Type 2 diabetes mellitus without complications: Secondary | ICD-10-CM | POA: Diagnosis not present

## 2020-12-14 DIAGNOSIS — H401132 Primary open-angle glaucoma, bilateral, moderate stage: Secondary | ICD-10-CM | POA: Diagnosis not present

## 2020-12-16 DIAGNOSIS — Z23 Encounter for immunization: Secondary | ICD-10-CM | POA: Diagnosis not present

## 2021-01-17 DIAGNOSIS — L570 Actinic keratosis: Secondary | ICD-10-CM | POA: Diagnosis not present

## 2021-01-17 DIAGNOSIS — X32XXXD Exposure to sunlight, subsequent encounter: Secondary | ICD-10-CM | POA: Diagnosis not present

## 2021-01-17 DIAGNOSIS — Z08 Encounter for follow-up examination after completed treatment for malignant neoplasm: Secondary | ICD-10-CM | POA: Diagnosis not present

## 2021-01-17 DIAGNOSIS — Z85828 Personal history of other malignant neoplasm of skin: Secondary | ICD-10-CM | POA: Diagnosis not present

## 2021-01-18 DIAGNOSIS — M0579 Rheumatoid arthritis with rheumatoid factor of multiple sites without organ or systems involvement: Secondary | ICD-10-CM | POA: Diagnosis not present

## 2021-01-26 ENCOUNTER — Other Ambulatory Visit: Payer: Self-pay | Admitting: Cardiology

## 2021-02-20 DIAGNOSIS — R7301 Impaired fasting glucose: Secondary | ICD-10-CM | POA: Diagnosis not present

## 2021-02-20 DIAGNOSIS — E559 Vitamin D deficiency, unspecified: Secondary | ICD-10-CM | POA: Diagnosis not present

## 2021-02-20 DIAGNOSIS — H409 Unspecified glaucoma: Secondary | ICD-10-CM | POA: Diagnosis not present

## 2021-02-20 DIAGNOSIS — I10 Essential (primary) hypertension: Secondary | ICD-10-CM | POA: Diagnosis not present

## 2021-02-20 DIAGNOSIS — E785 Hyperlipidemia, unspecified: Secondary | ICD-10-CM | POA: Diagnosis not present

## 2021-02-20 DIAGNOSIS — E039 Hypothyroidism, unspecified: Secondary | ICD-10-CM | POA: Diagnosis not present

## 2021-02-20 DIAGNOSIS — K219 Gastro-esophageal reflux disease without esophagitis: Secondary | ICD-10-CM | POA: Diagnosis not present

## 2021-02-20 DIAGNOSIS — E8770 Fluid overload, unspecified: Secondary | ICD-10-CM | POA: Diagnosis not present

## 2021-02-20 DIAGNOSIS — Z0001 Encounter for general adult medical examination with abnormal findings: Secondary | ICD-10-CM | POA: Diagnosis not present

## 2021-03-15 DIAGNOSIS — M064 Inflammatory polyarthropathy: Secondary | ICD-10-CM | POA: Diagnosis not present

## 2021-03-15 DIAGNOSIS — M0579 Rheumatoid arthritis with rheumatoid factor of multiple sites without organ or systems involvement: Secondary | ICD-10-CM | POA: Diagnosis not present

## 2021-03-19 NOTE — Progress Notes (Signed)
03/19/2021 Brian Ellison [id number] 05-Mar-1941   CHIEF COMPLAINT: Dysphagia   HISTORY OF PRESENT ILLNESS:  Brian Ellison. Brian Ellison is an 80 year old male with a past medical history of arthritis, lower back pain, coronary artery calcifications per CT, paroxysmal atrial fibrillation/atrial flutter not on anticoagulation (Xarelto was previously discontinued after he developed a spontaneous right lower leg hematoma), kidney stones, DM II, obstructive sleep apnea uses cpap, glaucoma, GERD and colon polyps. He presents to our office today as referred by Dr. Horald Pollen for further evaluation regarding dysphagia.  He underwent a cervical spine surgery in 2006 and since then he has difficulty swallowing pills and food which gets hung in his throat.  He stated his dysphagia symptoms have progressively worsened over the past few years. He has difficulty swallowing foods every time he eats.  He drinks a few sips of water and the stuck food passes down his esophagus.  No vomiting.  He denies having any heartburn.  He takes Pantoprazole 40 mg once daily.  He underwent an EGD 10 years ago at the [place], he does not recall if his esophagus was dilated at that time.  He denies having any upper or lower abdominal pain.  He is passing normal formed brown bowel movement once daily.  No rectal bleeding or black stools.  He previously had loose stools when he was on a higher dose of Metformin which resolved after the dose was reduced.  He reported undergoing lat least 4 colonoscopies in his lifetime at the [place].  He stated his first colonoscopy he had a few colon polyps.  His last colonoscopy was 10 years ago and he does not think he had any polyps at that time.   He underwent a fit test 12/30/2020 which was negative. He is on ASA 81 mg once daily.  He takes Diclofenac 75 mg once daily.   Laboratory studies 02/21/2021: WBC 6.5.  Hemoglobin 14.3.  Hematocrit 42.8.  Platelet 186.  Sodium 144.  Potassium 4.5.  BUN 17.  Creatinine 0.93.   Alk phos 49.  Total bili 0.3.  AST 15.  ALT 18.  History of atrial fibrillation and coronary artery calcifications (3 vessel CAD) per CT. History of paroxysmal atrial fibrillation triggered by acute illness.  Xarelto was previously discontinued after he developed right lower extremity hematoma.  A nuclear stress test 11/2018 was negative for ischemia.  An event monitor was done without further evidence of atrial fibrillation therefore Amiodarone was discontinued.  He was last seen in office by his cardiologist Dr. Fransico Him 07/26/2020 and his cardiac status was stable at that time.  He denies having any chest pain, palpitations or shortness of breath.   ECHO 10/05/2018: - Left ventricle: The cavity size was normal. There was moderate    concentric hypertrophy. Systolic function was normal. The    estimated ejection fraction was in the range of 60% to 65%. Wall    motion was normal; there were no regional wall motion    abnormalities. Findings consistent with left ventricular    diastolic dysfunction, grade indeterminate.  - Aortic valve: Mildly calcified annulus. Trileaflet; mildly    thickened, mildly calcified leaflets. Sclerosis without stenosis.  - Aorta: Ascending aortic diameter: 39 mm (S).  - Mitral valve: Mildly calcified annulus.  - Left atrium: The atrium was mildly dilated.  - Systemic veins: IVC is dilated with normal respiratory variation.    Estimated RAP: 8 mmHg.    Past Medical History:  Diagnosis  Date   Allergic rhinitis    Arthritis    SPINAL AND JOINTS   Bladder polyp    BPH (benign prostatic hypertrophy)    Chronic back pain    Diverticulosis    Dyslipidemia    per pt questionable   ED (erectile dysfunction) of organic origin    GERD (gastroesophageal reflux disease)    Glaucoma    BOTH EYES   History of colon polyps    History of gastritis    History of kidney stones    many yrs ago   History of neck injury    2005  fell w/ hyperextension injury w/  central cord syndrome   History of shingles    Lower urinary tract symptoms (LUTS)    OSA on CPAP    very severe per study 01-31-2006   Peyronie's disease    S/P insertion of spinal cord stimulator    2010   Tingling    right arm;takes Gabapentin   Type 2 diabetes mellitus ([place])    Past Surgical History:  Procedure Laterality Date   ANTERIOR CERVICAL DECOMP/DISCECTOMY FUSION  06-16-2004   C3 -- C5   APPENDECTOMY  age 45   CATARACT EXTRACTION W/ INTRAOCULAR LENS  IMPLANT, BILATERAL     COLONOSCOPY     CYSTOSCOPY WITH BIOPSY N/A 07/15/2015   Procedure: CYSTOSCOPY WITH BLADDER AND PROSTATE BIOPSY;  Surgeon: Festus Aloe, MD;  Location: [facility];  Service: Urology;  Laterality: N/A;   HAND SURGERY Right 2017   "right hand middle finger knuckle replaced"   INGUINAL HERNIA REPAIR Bilateral 1970's   NASAL SEPTUM SURGERY  yrs ago   PENILE PROSTHESIS IMPLANT  11-26-2007   PERMANANT SPINAL CORD STIMULATOR IMPLANT VIA THORACIC LAMINECTOMY  05-12-2009   Medtronic generator (left buttock)   POSTERIOR LAMINECTOMY / DECOMPRESSION CERVICAL SPINE  03-08-2005   C3  -- C6   RETINAL DETACHMENT SURGERY Right    REVERSE SHOULDER ARTHROPLASTY Left 09/23/2015   Procedure: LEFT REVERSE TOTAL SHOULDER ARTHROPLASTY;  Surgeon: Netta Cedars, MD;  Location: [place];  Service: Orthopedics;  Laterality: Left;   REVISION TOTAL ARTHROPLASTY RIGHT SHOULDER  07-22-2009   RIGHT HEEL SURGERY     spurs   RIGHT SHOULDER HEMIARTHROPLASTY  08-20-2008   ROTATOR CUFF REPAIR Bilateral x1 right/  x2  left - last one 2013   SHOULDER OPEN ROTATOR CUFF REPAIR Bilateral    SPINAL CORD STIMULATOR BATTERY EXCHANGE N/A 07/29/2018   Procedure: Spinal cord stimulator/implantable pulse generator battery change;  Surgeon: Erline Levine, MD;  Location: [place];  Service: Neurosurgery;  Laterality: N/A;  Spinal cord stimulator/implantable pulse generator battery change   TONSILLECTOMY     TOTAL KNEE ARTHROPLASTY  Right 04/17/2013   Procedure: RIGHT TOTAL KNEE ARTHROPLASTY;  Surgeon: Augustin Schooling, MD;  Location: [place];  Service: Orthopedics;  Laterality: Right;   TOTAL KNEE ARTHROPLASTY Left 02-10-2009   Social History: Past smoker, quit smoking 49 years ago.  Rare alcohol intake.  Family History: Father had diabetes and alcoholism deceased at the age of 35. Mother deceased age 74 with alcoholism.   No Known Allergies    Outpatient Encounter Medications as of 03/20/2021  Medication Sig   brimonidine (ALPHAGAN) 0.15 % ophthalmic solution Place 1 drop into both eyes 2 (two) times daily.   cetirizine (ZYRTEC) 10 MG tablet Take 10 mg by mouth daily.   Cholecalciferol (VITAMIN D3 PO) Take 1 capsule by mouth daily.   furosemide (LASIX) 20 MG tablet Take  1 tablet (20 mg total) by mouth every other day. 55m every other day   gabapentin (NEURONTIN) 300 MG capsule Take 600 mg by mouth at bedtime.   GLIPIZIDE PO Take 5 mg by mouth daily.    metFORMIN (GLUCOPHAGE) 500 MG tablet Take 1,000 mg by mouth 2 (two) times daily with a meal.    metoprolol succinate (TOPROL-XL) 25 MG 24 hr tablet Take 1 tablet by mouth once daily   pantoprazole (PROTONIX) 40 MG tablet Take 40 mg by mouth daily.   potassium chloride (K-DUR) 10 MEQ tablet Take 1 tablet (10 mEq total) by mouth daily.   rOPINIRole (REQUIP) 1 MG tablet Take 1 mg by mouth at bedtime.     rosuvastatin (CRESTOR) 10 MG tablet Take 1 tablet (10 mg total) by mouth daily.   vitamin B-12 (CYANOCOBALAMIN) 500 MCG tablet Take 750 mcg by mouth daily.    No facility-administered encounter medications on file as of 03/20/2021.    REVIEW OF SYSTEMS:  Gen: Denies fever, sweats or chills. No weight loss.  CV: Denies chest pain, palpitations or edema. Resp: Denies cough, shortness of breath of hemoptysis.  GI: See HPI.  GU : Denies urinary burning, blood in urine, increased urinary frequency or incontinence. MS: Denies joint pain, muscles aches or weakness. Derm:  Denies rash, itchiness, skin lesions or unhealing ulcers. Psych: Denies depression, anxiety or memory loss.  Heme: Denies bruising, bleeding. Neuro:  Denies headaches, dizziness or paresthesias. Endo:  + DM II.   PHYSICAL EXAM: BP 118/72   Pulse 65   Ht 6' (1.829 m)   Wt 254 lb (115.2 kg)   BMI 34.45 kg/m   General: 80year old male in no acute distress. Head: Normocephalic and atraumatic. Eyes:  Sclerae non-icteric, conjunctive pink. Ears: Normal auditory acuity. Mouth: Scattered missing dentition.  No ulcers or lesions.  Neck: Supple, no lymphadenopathy or thyromegaly.  Lungs: Clear bilaterally to auscultation without wheezes, crackles or rhonchi. Heart: Regular rate and rhythm. No murmur, rub or gallop appreciated.  Abdomen: Soft, nontender, non distended. No masses. No hepatosplenomegaly. Normoactive bowel sounds x 4 quadrants.  Rectal: Deferred. Musculoskeletal: Symmetrical with no gross deformities. Skin: Warm and dry. No rash or lesions on visible extremities. Extremities: No edema. Neurological: Alert oriented x 4, no focal deficits.  Psychological:  Alert and cooperative. Normal mood and affect.  ASSESSMENT AND PLAN:  1108  80year old male with a history of GERD and dysphagia.  Chronic dysphagia symptoms started after he had cervical spine surgery in 2006 which have progressively worsened. -Barium swallow with tablet rule out esophageal dysmotility -EGD benefits and risks discussed including risk with sedation, risk of bleeding, perforation and infection  -If the above evaluation is unrevealing, to consider a modified barium swallow study with speech pathologist -Continue pantoprazole 40 mg daily  2.  Questionable history of colon polyps.  Patient reported his last colonoscopy was approximately 10 years ago and he is unsure if he had polyps at that time or not.  No known family history of colorectal cancer. -Request a copy of his most [facility] [facility] [place] [facility].  History of proximal atrial fibrillation/atrial flutter on ASA  4. DM II  5. OSA uses CPAP  Further recommendations to be determined after the above evaluation completed           CC:  [facility]

## 2021-03-20 ENCOUNTER — Ambulatory Visit (INDEPENDENT_AMBULATORY_CARE_PROVIDER_SITE_OTHER): Payer: Medicare Other | Admitting: Nurse Practitioner

## 2021-03-20 ENCOUNTER — Encounter: Payer: Self-pay | Admitting: Nurse Practitioner

## 2021-03-20 VITALS — BP 118/72 | HR 65 | Ht 72.0 in | Wt 254.0 lb

## 2021-03-20 DIAGNOSIS — R1311 Dysphagia, oral phase: Secondary | ICD-10-CM | POA: Diagnosis not present

## 2021-03-20 DIAGNOSIS — K219 Gastro-esophageal reflux disease without esophagitis: Secondary | ICD-10-CM | POA: Diagnosis not present

## 2021-03-20 NOTE — Patient Instructions (Addendum)
If you are age 82 or older, your body mass index should be between 23-30. Your Body mass index is 34.45 kg/m. If this is out of the aforementioned range listed, please consider follow up with your Primary Care Provider.  The [facility] providers would like to encourage you to use [facility] to communicate with providers for non-urgent requests or questions.  Due to long hold times on the telephone, sending your provider a message by [facility] may be faster and more efficient way to get a response. Please allow 48 business hours for a response.  Please remember that this is for non-urgent requests/questions.  PROCEDURES: You have been scheduled for a EGD. Please follow the written instructions given to you at your visit today. If you use inhalers (even only as needed), please bring them with you on the day of your procedure.  IMAGING: You will be contacted by [facility] (Your caller ID will indicate phone # [phone]) in the next 2 days to schedule your Barium Swallow Test. If you have not heard from them within 2 business days, please call [facility] at [phone] to follow up on the status of your appointment.    It was great seeing you today! Thank you for entrusting me with your care and choosing [facility].  Noralyn Pick, CRNP

## 2021-03-20 NOTE — Progress Notes (Signed)
Attending Physician's Attestation   I have reviewed the chart.   I agree with the Advanced Practitioner's note, impression, and recommendations with any updates as below.    Janny Crute Mansouraty, MD [facility] Advanced [facility]oscopy Office # [phone]  

## 2021-03-21 ENCOUNTER — Other Ambulatory Visit: Payer: Self-pay

## 2021-03-21 ENCOUNTER — Encounter: Payer: Self-pay | Admitting: Gastroenterology

## 2021-03-21 ENCOUNTER — Ambulatory Visit (AMBULATORY_SURGERY_CENTER): Payer: Medicare Other | Admitting: Gastroenterology

## 2021-03-21 VITALS — BP 99/51 | HR 54 | Temp 97.1°F | Resp 10 | Ht 72.0 in | Wt 254.0 lb

## 2021-03-21 DIAGNOSIS — K3189 Other diseases of stomach and duodenum: Secondary | ICD-10-CM

## 2021-03-21 DIAGNOSIS — K2100 Gastro-esophageal reflux disease with esophagitis, without bleeding: Secondary | ICD-10-CM

## 2021-03-21 DIAGNOSIS — R1310 Dysphagia, unspecified: Secondary | ICD-10-CM | POA: Diagnosis not present

## 2021-03-21 DIAGNOSIS — K219 Gastro-esophageal reflux disease without esophagitis: Secondary | ICD-10-CM | POA: Diagnosis not present

## 2021-03-21 DIAGNOSIS — R1311 Dysphagia, oral phase: Secondary | ICD-10-CM | POA: Diagnosis not present

## 2021-03-21 DIAGNOSIS — K2970 Gastritis, unspecified, without bleeding: Secondary | ICD-10-CM

## 2021-03-21 DIAGNOSIS — K2289 Other specified disease of esophagus: Secondary | ICD-10-CM | POA: Diagnosis not present

## 2021-03-21 DIAGNOSIS — K229 Disease of esophagus, unspecified: Secondary | ICD-10-CM

## 2021-03-21 MED ORDER — SODIUM CHLORIDE 0.9 % IV SOLN: 500.0000 mL | Freq: Once | INTRAVENOUS | Status: DC

## 2021-03-21 NOTE — Op Note (Signed)
[place] Patient Name: Brian Ellison Procedure Date: 03/21/2021 11:12 AM MRN: [id number] Endoscopist: Justice Britain , MD Age: 80 Referring MD:  Date of Birth: 1941/03/01 Gender: Male Account #: [id number] Procedure:                Upper GI endoscopy Indications:              Dysphagia, Heartburn, Gastro-esophageal reflux                            disease, Esophageal reflux symptoms that persist                            despite appropriate therapy, Screening for                            Barrett's esophagus in patient at risk for this                            condition Medicines:                Monitored Anesthesia Care Procedure:                Pre-Anesthesia Assessment:                           - Prior to the procedure, a History and Physical                            was performed, and patient medications and                            allergies were reviewed. The patient's tolerance of                            previous anesthesia was also reviewed. The risks                            and benefits of the procedure and the sedation                            options and risks were discussed with the patient.                            All questions were answered, and informed consent                            was obtained. Prior Anticoagulants: The patient has                            taken no previous anticoagulant or antiplatelet                            agents except for aspirin. ASA Grade Assessment:  III - A patient with severe systemic disease. After                            reviewing the risks and benefits, the patient was                            deemed in satisfactory condition to undergo the                            procedure.                           After obtaining informed consent, the endoscope was                            passed under direct vision. Throughout the                            procedure,  the patient's blood pressure, pulse, and                            oxygen saturations were monitored continuously. The                            GIF [id number] was introduced through the                            mouth, and advanced to the second part of duodenum.                            The upper GI endoscopy was accomplished without                            difficulty. Scope In: Scope Out: Findings:                 No gross lesions were noted in the proximal                            esophagus, in the mid esophagus and in the distal                            esophagus. Biopsies were taken with a cold forceps                            for histology.                           Scattered islands of salmon-colored mucosa were                            present from 39 to 40 cm. No other visible                            abnormalities were present. Biopsies  were taken                            with a cold forceps for histology.                           The Z-line was irregular and was found 40 cm from                            the incisors.                           After the completion of the rest of the EGD, a                            guidewire was placed and the scope was withdrawn.                            Dilation was performed in the esophagus with a                            Savary dilator with no resistance at 18 mm. The                            dilation site was examined following endoscope                            reinsertion and showed no change.                           Patchy mildly erythematous mucosa without bleeding                            was found in the entire examined stomach. Biopsies                            were taken with a cold forceps for histology and                            Helicobacter pylori testing.                           No gross lesions were noted in the duodenal bulb,                            in the first portion of  the duodenum and in the                            second portion of the duodenum. Complications:            No immediate complications. Estimated Blood Loss:     Estimated blood loss was minimal. Impression:               - No gross lesions in majority of esophagus.  Biopsied for EoE/LoE.                           - Salmon-colored mucosa islands suspicious for                            short-segment Barrett's esophagus distally.                            Biopsied.                           - Z-line irregular, 40 cm from the incisors.                           - Dilation performed in the esophagus.                           - Erythematous mucosa in the stomach. Biopsied for                            HP.                           - No gross lesions in the duodenal bulb, in the                            first portion of the duodenum and in the second                            portion of the duodenum. Recommendation:           - The patient will be observed post-procedure,                            until all discharge criteria are met.                           - Discharge patient to home.                           - Patient has a contact number available for                            emergencies. The signs and symptoms of potential                            delayed complications were discussed with the                            patient. Return to normal activities tomorrow.                            Written discharge instructions were provided to the  patient.                           - Dilation diet as per protocol.                           - Continue present medications.                           - Await pathology results.                           - If patient's symptoms persist within the coming                            weeks, then recommend a Barium Swallow, Esophageal                            Manometry, and  potentially Modified Barium Swallow.                            Suspect ineffective motility as larger issue.                           - Follow up in clinic to be arranged in next 4-6                            weeks.                           - The findings and recommendations were discussed                            with the patient.                           - The findings and recommendations were discussed                            with the patient's family. Justice Britain, MD 03/21/2021 11:46:21 AM

## 2021-03-21 NOTE — Progress Notes (Signed)
To Pacu, VSS. Report to [email or web address] 

## 2021-03-21 NOTE — Progress Notes (Signed)
Called to room to assist during endoscopic procedure.  Patient ID and intended procedure confirmed with present staff. Received instructions for my participation in the procedure from the performing physician.  

## 2021-03-21 NOTE — Patient Instructions (Signed)
Read all of the handouts given to you by your recovery room nurse.   Follow the special diet for today.  YOU HAD AN ENDOSCOPIC PROCEDURE TODAY AT [facility]:   Refer to the procedure report that was given to you for any specific questions about what was found during the examination.  If the procedure report does not answer your questions, please call your gastroenterologist to clarify.  If you requested that your care partner not be given the details of your procedure findings, then the procedure report has been included in a sealed envelope for you to review at your convenience later.  YOU SHOULD EXPECT: Some feelings of bloating in the abdomen. Passage of more gas than usual.  Walking can help get rid of the air that was put into your GI tract during the procedure and reduce the bloating.   Please Note:  You might notice some irritation and congestion in your nose or some drainage.  This is from the oxygen used during your procedure.  There is no need for concern and it should clear up in a day or so.  SYMPTOMS TO REPORT IMMEDIATELY:  Following upper endoscopy (EGD)  Vomiting of blood or coffee ground material  New chest pain or pain under the shoulder blades  Painful or persistently difficult swallowing  New shortness of breath  Fever of 100F or higher  Black, tarry-looking stools  For urgent or emergent issues, a gastroenterologist can be reached at any hour by calling [phone]. Do not use MyChart messaging for urgent concerns.    DIET:  We do recommend clear liquids until 1230 pm.  Then, a soft diet  for the rest of today. Then, you may proceed to your regular diet.  Drink plenty of fluids but you should avoid alcoholic beverages for 24 hours.  ACTIVITY:  You should plan to take it easy for the rest of today and you should NOT DRIVE or use heavy machinery until tomorrow (because of the sedation medicines used during the test).    FOLLOW UP: Our staff will call  the number listed on your records 48-72 hours following your procedure to check on you and address any questions or concerns that you may have regarding the information given to you following your procedure. If we do not reach you, we will leave a message.  We will attempt to reach you two times.  During this call, we will ask if you have developed any symptoms of COVID 19. If you develop any symptoms (ie: fever, flu-like symptoms, shortness of breath, cough etc.) before then, please call [phone].  If you test positive for Covid 19 in the 2 weeks post procedure, please call and report this information to [place].    If any biopsies were taken you will be contacted by phone or by letter within the next 1-3 weeks.  Please call us at [phone] if you have not heard about the biopsies in 3 weeks.    SIGNATURES/CONFIDENTIALITY: You and/or your care partner have signed paperwork which will be entered into your electronic medical record.  These signatures attest to the fact that that the information above on your After Visit Summary has been reviewed and is understood.  Full responsibility of the confidentiality of this discharge information lies with you and/or your care-partner.

## 2021-03-21 NOTE — Progress Notes (Signed)
Medical history reviewed with no changes noted. VS assessed by N[facility] 

## 2021-03-23 ENCOUNTER — Telehealth: Payer: Self-pay | Admitting: *Deleted

## 2021-03-23 NOTE — Telephone Encounter (Signed)
  Follow up Call-  Call back number 03/21/2021  Post procedure Call Back phone  # [phone]  Permission to leave phone message Yes  Some recent data might be hidden     Patient questions:  Do you have a fever, pain , or abdominal swelling? No. Pain Score  0 *  Have you tolerated food without any problems? Yes.    Have you been able to return to your normal activities? Yes.    Do you have any questions about your discharge instructions: Diet   No. Medications  No. Follow up visit  No.  Do you have questions or concerns about your Care? No.  Actions: * If pain score is 4 or above: No action needed, pain <4.  Have you developed a fever since your procedure? no  2.   Have you had an respiratory symptoms (SOB or cough) since your procedure? no  3.   Have you tested positive for COVID 19 since your procedure no  4.   Have you had any family members/close contacts diagnosed with the COVID 19 since your procedure?  no   If yes to any of these questions please route to Joylene John, RN and Joella Prince, RN

## 2021-03-24 ENCOUNTER — Encounter: Payer: Self-pay | Admitting: Gastroenterology

## 2021-03-30 ENCOUNTER — Ambulatory Visit (HOSPITAL_COMMUNITY)
Admission: RE | Admit: 2021-03-30 | Discharge: 2021-03-30 | Disposition: A | Discharge status: Home / Self Care | Payer: Medicare Other | Source: Ambulatory Visit | Attending: Nurse Practitioner | Admitting: Nurse Practitioner

## 2021-03-30 ENCOUNTER — Other Ambulatory Visit: Payer: Self-pay

## 2021-03-30 DIAGNOSIS — K219 Gastro-esophageal reflux disease without esophagitis: Secondary | ICD-10-CM

## 2021-03-30 DIAGNOSIS — R1310 Dysphagia, unspecified: Secondary | ICD-10-CM | POA: Diagnosis not present

## 2021-03-30 DIAGNOSIS — R1311 Dysphagia, oral phase: Secondary | ICD-10-CM | POA: Insufficient documentation

## 2021-03-30 DIAGNOSIS — K224 Dyskinesia of esophagus: Secondary | ICD-10-CM | POA: Diagnosis not present

## 2021-04-03 NOTE — Progress Notes (Signed)
Subjective:    Patient ID: Brian Mosses Sr., male    DOB: Nov 24, 1940, 80 y.o.   MRN: [id number]  HPI male former smoker followed for OSA, restless legs, complicated by obesity, HBP, allergic rhinitis, GERD, glaucoma, DM 2 NPSG 01/11/06- AHI 104/ hr, desaturation to 68%  ------------------------------------------------------------------------------------------------------------   04/04/20-  80 year old male former smoker followed for OSA, restless legs, complicated by obesity, HBP, allergic rhinitis, GERD, Glaucoma, DM 2, PAFib/amiodarone, CAD, Hyperlipidemia CPAP auto 5-20/  APS/[employer] Download- compliance 90%, AHI 2.2/ hr -----sleeping well, no problems with leaks. Hosp recently for R calf hematoma on Xarelto> changed to ASA. Body weight today 257 lbs Doing well with CPAP- expresess no concerns. Sleeps better. Machine is new in past year. Nasal mask. Has had 2 Moderna Covax  04/04/21- 80 year old male former smoker (15 pkyrs)followed for OSA, restless legs, complicated by obesity, HBP, allergic rhinitis, GERD, Glaucoma, DM 2, Hyperlipidemia, PAFib/amiodarone, CAD,  CPAP auto 5-20/  APS/[employer] Download- compliance 100%, AHI 3.1/ hr Body weight today-250 lbs Covid vax-4 Moderna Download reviewed.  He has no concerns and feels he is sleeping better because he uses CPAP. Denies interval health concerns.   ROS-see HPI   + = positive Constitutional:    weight loss, night sweats, fevers, chills, fatigue, lassitude. HEENT:    headaches, difficulty swallowing, tooth/dental problems, sore throat,       sneezing, itching, ear ache, nasal congestion, post nasal drip, snoring CV:    chest pain, orthopnea, PND, swelling in lower extremities, anasarca,                                   dizziness, palpitations Resp:   shortness of breath with exertion or at rest.                productive cough,   non-productive cough, coughing up of blood.              change in color of mucus.  wheezing.    Skin:    rash or lesions. GI:  No-   heartburn, indigestion, abdominal pain, nausea, vomiting, diarrhea,                 change in bowel habits, loss of appetite GU: dysuria, change in color of urine, no urgency or frequency.   flank pain. MS:   joint pain, stiffness, decreased range of motion, back pain. Neuro-     nothing unusual Psych:  change in mood or affect.  depression or anxiety.   memory loss.   Objective:  OBJ- Physical Exam General- Alert, Oriented, Affect-appropriate, Distress- none acute, +obese Skin- + scabs R forearm Lymphadenopathy- none Head- atraumatic            Eyes- Gross vision intact, PERRLA, conjunctivae and secretions clear            Ears- Hearing, canals-normal            Nose- clear, no-Septal dev, mucus, polyps, erosion, perforation             Throat- Mallampati III , mucosa clear , drainage- none, tonsils- atrophic Neck- flexible , trachea midline, no stridor , thyroid nl, carotid no bruit Chest - symmetrical excursion , unlabored           Heart/CV- RRR to exam , no murmur , no gallop  , no rub, nl s1 s2                           -  JVD- none , edema- none, stasis changes- none, varices- none           Lung- clear to P&A, wheeze- none, cough- none , dullness-none, rub- none           Chest wall-  Abd-  Br/ Gen/ Rectal- Not done, not indicated Extrem- +R TKR scar Neuro- grossly intact to observation         Assessment & Plan:

## 2021-04-04 ENCOUNTER — Other Ambulatory Visit: Payer: Self-pay

## 2021-04-04 ENCOUNTER — Encounter: Payer: Self-pay | Admitting: Internal Medicine

## 2021-04-04 ENCOUNTER — Ambulatory Visit (INDEPENDENT_AMBULATORY_CARE_PROVIDER_SITE_OTHER): Payer: Medicare Other | Admitting: Internal Medicine

## 2021-04-04 VITALS — BP 120/60 | HR 65 | Temp 98.1°F | Ht 72.0 in | Wt 250.6 lb

## 2021-04-04 DIAGNOSIS — I480 Paroxysmal atrial fibrillation: Secondary | ICD-10-CM

## 2021-04-04 DIAGNOSIS — I2510 Atherosclerotic heart disease of native coronary artery without angina pectoris: Secondary | ICD-10-CM | POA: Diagnosis not present

## 2021-04-04 DIAGNOSIS — G4733 Obstructive sleep apnea (adult) (pediatric): Secondary | ICD-10-CM

## 2021-04-04 NOTE — Patient Instructions (Signed)
We can continue CPAP auto 5-20/ [employer]  Glad you are doing well. Please cal if we can help

## 2021-05-09 DIAGNOSIS — M5459 Other low back pain: Secondary | ICD-10-CM | POA: Diagnosis not present

## 2021-05-10 DIAGNOSIS — M0579 Rheumatoid arthritis with rheumatoid factor of multiple sites without organ or systems involvement: Secondary | ICD-10-CM | POA: Diagnosis not present

## 2021-05-24 DIAGNOSIS — Z23 Encounter for immunization: Secondary | ICD-10-CM | POA: Diagnosis not present

## 2021-06-08 DIAGNOSIS — H9313 Tinnitus, bilateral: Secondary | ICD-10-CM | POA: Diagnosis not present

## 2021-06-08 DIAGNOSIS — M069 Rheumatoid arthritis, unspecified: Secondary | ICD-10-CM | POA: Diagnosis not present

## 2021-06-08 DIAGNOSIS — M5416 Radiculopathy, lumbar region: Secondary | ICD-10-CM | POA: Diagnosis not present

## 2021-06-08 DIAGNOSIS — E1140 Type 2 diabetes mellitus with diabetic neuropathy, unspecified: Secondary | ICD-10-CM | POA: Diagnosis not present

## 2021-06-08 DIAGNOSIS — I10 Essential (primary) hypertension: Secondary | ICD-10-CM | POA: Diagnosis not present

## 2021-06-11 ENCOUNTER — Other Ambulatory Visit: Payer: Self-pay | Admitting: Cardiology

## 2021-06-14 DIAGNOSIS — M2550 Pain in unspecified joint: Secondary | ICD-10-CM | POA: Diagnosis not present

## 2021-06-14 DIAGNOSIS — E669 Obesity, unspecified: Secondary | ICD-10-CM | POA: Diagnosis not present

## 2021-06-14 DIAGNOSIS — Z6833 Body mass index (BMI) 33.0-33.9, adult: Secondary | ICD-10-CM | POA: Diagnosis not present

## 2021-06-14 DIAGNOSIS — M0579 Rheumatoid arthritis with rheumatoid factor of multiple sites without organ or systems involvement: Secondary | ICD-10-CM | POA: Diagnosis not present

## 2021-06-14 DIAGNOSIS — Z79899 Other long term (current) drug therapy: Secondary | ICD-10-CM | POA: Diagnosis not present

## 2021-06-14 DIAGNOSIS — M150 Primary generalized (osteo)arthritis: Secondary | ICD-10-CM | POA: Diagnosis not present

## 2021-06-21 DIAGNOSIS — H40022 Open angle with borderline findings, high risk, left eye: Secondary | ICD-10-CM | POA: Diagnosis not present

## 2021-06-21 DIAGNOSIS — H4051X2 Glaucoma secondary to other eye disorders, right eye, moderate stage: Secondary | ICD-10-CM | POA: Diagnosis not present

## 2021-06-23 ENCOUNTER — Other Ambulatory Visit: Payer: Self-pay | Admitting: Physician Assistant

## 2021-06-23 DIAGNOSIS — M5416 Radiculopathy, lumbar region: Secondary | ICD-10-CM

## 2021-07-11 DIAGNOSIS — E119 Type 2 diabetes mellitus without complications: Secondary | ICD-10-CM | POA: Diagnosis not present

## 2021-07-11 DIAGNOSIS — E785 Hyperlipidemia, unspecified: Secondary | ICD-10-CM | POA: Diagnosis not present

## 2021-07-11 DIAGNOSIS — E559 Vitamin D deficiency, unspecified: Secondary | ICD-10-CM | POA: Diagnosis not present

## 2021-07-11 DIAGNOSIS — Z0001 Encounter for general adult medical examination with abnormal findings: Secondary | ICD-10-CM | POA: Diagnosis not present

## 2021-07-11 DIAGNOSIS — N390 Urinary tract infection, site not specified: Secondary | ICD-10-CM | POA: Diagnosis not present

## 2021-07-11 DIAGNOSIS — I10 Essential (primary) hypertension: Secondary | ICD-10-CM | POA: Diagnosis not present

## 2021-07-11 DIAGNOSIS — Z125 Encounter for screening for malignant neoplasm of prostate: Secondary | ICD-10-CM | POA: Diagnosis not present

## 2021-07-11 DIAGNOSIS — G2581 Restless legs syndrome: Secondary | ICD-10-CM | POA: Diagnosis not present

## 2021-07-11 DIAGNOSIS — E1165 Type 2 diabetes mellitus with hyperglycemia: Secondary | ICD-10-CM | POA: Diagnosis not present

## 2021-07-11 DIAGNOSIS — R3589 Other polyuria: Secondary | ICD-10-CM | POA: Diagnosis not present

## 2021-07-12 ENCOUNTER — Ambulatory Visit
Admission: RE | Admit: 2021-07-12 | Discharge: 2021-07-12 | Disposition: A | Discharge status: Home / Self Care | Payer: Medicare Other | Source: Ambulatory Visit | Attending: Physician Assistant | Admitting: Physician Assistant

## 2021-07-12 ENCOUNTER — Other Ambulatory Visit: Payer: Self-pay

## 2021-07-12 DIAGNOSIS — M5416 Radiculopathy, lumbar region: Secondary | ICD-10-CM

## 2021-07-12 DIAGNOSIS — M5116 Intervertebral disc disorders with radiculopathy, lumbar region: Secondary | ICD-10-CM | POA: Diagnosis not present

## 2021-07-12 DIAGNOSIS — H40022 Open angle with borderline findings, high risk, left eye: Secondary | ICD-10-CM | POA: Diagnosis not present

## 2021-07-12 DIAGNOSIS — H4051X2 Glaucoma secondary to other eye disorders, right eye, moderate stage: Secondary | ICD-10-CM | POA: Diagnosis not present

## 2021-07-12 DIAGNOSIS — H5711 Ocular pain, right eye: Secondary | ICD-10-CM | POA: Diagnosis not present

## 2021-07-12 DIAGNOSIS — M48061 Spinal stenosis, lumbar region without neurogenic claudication: Secondary | ICD-10-CM | POA: Diagnosis not present

## 2021-07-12 DIAGNOSIS — M4316 Spondylolisthesis, lumbar region: Secondary | ICD-10-CM | POA: Diagnosis not present

## 2021-07-12 MED ORDER — MEPERIDINE HCL 50 MG/ML IJ SOLN: 50.0000 mg | Freq: Once | INTRAMUSCULAR | Status: DC | PRN

## 2021-07-12 MED ORDER — IOPAMIDOL (ISOVUE-300) INJECTION 61%
10.0000 mL | Freq: Once | INTRAVENOUS | Status: AC | PRN
  Administered 2021-07-12: 10 mL via INTRAVENOUS

## 2021-07-12 MED ORDER — ONDANSETRON HCL 4 MG/2ML IJ SOLN: 4.0000 mg | Freq: Once | INTRAMUSCULAR | Status: DC | PRN

## 2021-07-12 MED ORDER — DIAZEPAM 5 MG PO TABS: 5.0000 mg | ORAL_TABLET | Freq: Once | ORAL | Status: DC

## 2021-07-12 NOTE — Progress Notes (Signed)
Pt reports he has a spinal cord stimulator but stimulator has not been charged in months and is currently off. Pt brought remote to facility, remote does not detect stimulator, stimulator is not currently on.   Pt reports he was not made aware that he needed a driver after myelogram procedure. Discussed importance of having driver, pt verbalized understanding. I spoke with MD Laurence Ferrari regarding no driver available at this time, will proceed with myelogram but NO Valium will be given prior to procedure, pt wants to proceed without Valium.

## 2021-07-12 NOTE — Discharge Instructions (Signed)

## 2021-07-18 DIAGNOSIS — L988 Other specified disorders of the skin and subcutaneous tissue: Secondary | ICD-10-CM | POA: Diagnosis not present

## 2021-07-18 DIAGNOSIS — Z1283 Encounter for screening for malignant neoplasm of skin: Secondary | ICD-10-CM | POA: Diagnosis not present

## 2021-07-18 DIAGNOSIS — X32XXXD Exposure to sunlight, subsequent encounter: Secondary | ICD-10-CM | POA: Diagnosis not present

## 2021-07-18 DIAGNOSIS — L821 Other seborrheic keratosis: Secondary | ICD-10-CM | POA: Diagnosis not present

## 2021-07-18 DIAGNOSIS — L820 Inflamed seborrheic keratosis: Secondary | ICD-10-CM | POA: Diagnosis not present

## 2021-07-18 DIAGNOSIS — M0579 Rheumatoid arthritis with rheumatoid factor of multiple sites without organ or systems involvement: Secondary | ICD-10-CM | POA: Diagnosis not present

## 2021-07-18 DIAGNOSIS — D485 Neoplasm of uncertain behavior of skin: Secondary | ICD-10-CM | POA: Diagnosis not present

## 2021-07-18 DIAGNOSIS — L570 Actinic keratosis: Secondary | ICD-10-CM | POA: Diagnosis not present

## 2021-07-18 DIAGNOSIS — D225 Melanocytic nevi of trunk: Secondary | ICD-10-CM | POA: Diagnosis not present

## 2021-07-19 DIAGNOSIS — H9313 Tinnitus, bilateral: Secondary | ICD-10-CM | POA: Diagnosis not present

## 2021-07-19 DIAGNOSIS — H903 Sensorineural hearing loss, bilateral: Secondary | ICD-10-CM | POA: Diagnosis not present

## 2021-07-19 DIAGNOSIS — H6122 Impacted cerumen, left ear: Secondary | ICD-10-CM | POA: Diagnosis not present

## 2021-07-19 DIAGNOSIS — H838X3 Other specified diseases of inner ear, bilateral: Secondary | ICD-10-CM | POA: Diagnosis not present

## 2021-07-20 DIAGNOSIS — M545 Low back pain, unspecified: Secondary | ICD-10-CM | POA: Diagnosis not present

## 2021-07-20 DIAGNOSIS — I700 Atherosclerosis of aorta: Secondary | ICD-10-CM | POA: Diagnosis not present

## 2021-07-20 DIAGNOSIS — M48061 Spinal stenosis, lumbar region without neurogenic claudication: Secondary | ICD-10-CM | POA: Diagnosis not present

## 2021-07-21 ENCOUNTER — Other Ambulatory Visit: Payer: Self-pay | Admitting: Cardiology

## 2021-07-24 DIAGNOSIS — M48061 Spinal stenosis, lumbar region without neurogenic claudication: Secondary | ICD-10-CM | POA: Diagnosis not present

## 2021-07-25 ENCOUNTER — Encounter: Payer: Self-pay | Admitting: Cardiology

## 2021-07-25 ENCOUNTER — Ambulatory Visit (INDEPENDENT_AMBULATORY_CARE_PROVIDER_SITE_OTHER): Payer: Medicare Other | Admitting: Cardiology

## 2021-07-25 ENCOUNTER — Other Ambulatory Visit: Payer: Self-pay

## 2021-07-25 VITALS — BP 128/78 | HR 67 | Ht 72.0 in | Wt 247.6 lb

## 2021-07-25 DIAGNOSIS — I480 Paroxysmal atrial fibrillation: Secondary | ICD-10-CM

## 2021-07-25 DIAGNOSIS — E7800 Pure hypercholesterolemia, unspecified: Secondary | ICD-10-CM | POA: Diagnosis not present

## 2021-07-25 DIAGNOSIS — I2510 Atherosclerotic heart disease of native coronary artery without angina pectoris: Secondary | ICD-10-CM | POA: Diagnosis not present

## 2021-07-25 MED ORDER — ROSUVASTATIN CALCIUM 10 MG PO TABS: 10.0000 mg | ORAL_TABLET | Freq: Every day | ORAL | 3 refills | Status: DC

## 2021-07-25 MED ORDER — METOPROLOL SUCCINATE ER 25 MG PO TB24: 25.0000 mg | ORAL_TABLET | Freq: Every day | ORAL | 3 refills | Status: DC

## 2021-07-25 NOTE — Patient Instructions (Signed)

## 2021-07-25 NOTE — Progress Notes (Signed)
Date:  07/25/2021   ID:  CREIG LANDIN, DOB 1941-01-14, MRN [id number]   PCP:  [facility]  Cardiologist:  Fransico Him, MD  Electrophysiologist:  None   Chief Complaint:  OSA, afib, Coronary artery calcifications  History of Present Illness:     This is a 80yo male with a history of Dm, OSA and hyperlipidemia who was admitted to [facility] 10/04/2018 with complaints of CP. Chest CT neg for PE but showed mild 3v CAD.  He was dx with LLL PNA by chest CT treated with antibx.  He then developed chills, rigors and fever and developed afib with RVR treated with Lopressor 12.5mg  BID.  Echo showed normal LVF with no signficant valvular heart dz.  There was mild LAE.  TSH was normal.  He was started on Xarelto 20mg  daily for CHADS2VAS score of 3.  He converted back to NSR the next day.      He was seen back 10/2018 and was back in atrial flutter and was referred to afib clinic.  He did not have any further CP which was felt to be related to PNA in hospital. In afib clinic, therapeutic options including Tikosyn, amiodarone, vs rate control. He did not want Tikosyn because his wife had an episode of torsades de pointes while being loaded on the medication. He wanted to take some time to consider amiodarone after discussing risks and benefits of the medication.    He later decided to start on Amio and was loaded on 200mg  BID and converted to NSR at next afib clinic.  His Amio was decreased to 200mg  daily.  Due to ongoing DOE , Nuclear stress test was done which showed no ischemia.  He also had a  spontaneous hematoma of his thigh and Xarelto was stopped.  Since his afib occurred in setting of acute illness, an event monitor was done which showed no further afib and he is now off Amio as well.    He is here today for followup and is doing well.  He denies any chest pain or pressure, SOB, DOE, PND, orthopnea, LE edema, dizziness, palpitations or syncope. He is compliant with his meds and is  tolerating meds with no SE.     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   History of shingles    Lower urinary tract symptoms (LUTS)    OSA on CPAP    very severe per study 01-31-2006   Peyronie's disease    S/P insertion of spinal cord stimulator    2010   Tingling    right arm;takes Gabapentin   Type 2 diabetes mellitus ([place])    Past Surgical History:  Procedure Laterality Date   ANTERIOR CERVICAL DECOMP/DISCECTOMY FUSION  06-16-2004   C3 --  C5   APPENDECTOMY  age 88   CATARACT EXTRACTION W/ INTRAOCULAR LENS  IMPLANT, BILATERAL     COLONOSCOPY     CYSTOSCOPY WITH BIOPSY N/A 07/15/2015   Procedure: CYSTOSCOPY WITH BLADDER AND PROSTATE BIOPSY;  Surgeon: Festus Aloe, MD;  Location: [facility];  Service: Urology;  Laterality: N/A;   HAND SURGERY Right 2017   "right hand middle finger knuckle replaced"   INGUINAL HERNIA REPAIR Bilateral 1970's   NASAL SEPTUM SURGERY  yrs ago   PENILE PROSTHESIS IMPLANT  11-26-2007   PERMANANT SPINAL CORD STIMULATOR IMPLANT VIA THORACIC LAMINECTOMY  05-12-2009   Medtronic generator (left buttock)   POSTERIOR LAMINECTOMY / DECOMPRESSION CERVICAL SPINE  03-08-2005   C3  -- C6   RETINAL DETACHMENT SURGERY Right    REVERSE SHOULDER ARTHROPLASTY Left 09/23/2015   Procedure: LEFT REVERSE TOTAL SHOULDER ARTHROPLASTY;  Surgeon: Netta Cedars, MD;  Location: [facility];  Service:  Orthopedics;  Laterality: Left;   REVISION TOTAL ARTHROPLASTY RIGHT SHOULDER  07-22-2009   RIGHT HEEL SURGERY     spurs   RIGHT SHOULDER HEMIARTHROPLASTY  08-20-2008   ROTATOR CUFF REPAIR Bilateral x1 right/  x2  left - last one 2013   SHOULDER OPEN ROTATOR CUFF REPAIR Bilateral    SPINAL CORD STIMULATOR BATTERY EXCHANGE N/A 07/29/2018   Procedure: Spinal cord stimulator/implantable pulse generator battery change;  Surgeon: Erline Levine, MD;  Location: [facility];  Service: Neurosurgery;  Laterality: N/A;  Spinal cord stimulator/implantable pulse generator battery change   TONSILLECTOMY     TOTAL KNEE ARTHROPLASTY Right 04/17/2013   Procedure: RIGHT TOTAL KNEE ARTHROPLASTY;  Surgeon: Augustin Schooling, MD;  Location: [facility];  Service: Orthopedics;  Laterality: Right;   TOTAL KNEE ARTHROPLASTY Left 02-10-2009     Current Meds  Medication Sig   aspirin 81 MG EC tablet Take 1 tablet by mouth daily.   brimonidine (ALPHAGAN) 0.15 % ophthalmic solution Place 1 drop into both eyes 2 (two) times daily.   chlorpheniramine (CHLOR-TRIMETON) 4 MG tablet Take 4 mg by mouth.   Cholecalciferol (VITAMIN D3 PO) Take 1 capsule by mouth daily.   diclofenac (VOLTAREN) 75 MG EC tablet Take 75 mg by mouth daily.   empagliflozin (JARDIANCE) 25 MG TABS tablet Take 25 mg by mouth daily.   gabapentin (NEURONTIN) 300 MG capsule Take 900 mg by mouth daily.   GLIPIZIDE PO Take 5 mg by mouth daily.    golimumab 2 mg/kg in sodium chloride 0.9 % Inject 2 mg/kg into the vein every 8 (eight) weeks.   metFORMIN (GLUCOPHAGE) 500 MG tablet Take 500 mg by mouth at bedtime.   metoprolol succinate (TOPROL-XL) 25 MG 24 hr tablet Take 1 tablet by mouth once daily   pantoprazole (PROTONIX) 40 MG tablet Take 40 mg by mouth daily.   potassium chloride (K-DUR) 10 MEQ tablet Take 1 tablet (10 mEq total) by mouth daily.   rOPINIRole (REQUIP) 1 MG tablet Take 1 mg by mouth at bedtime.     rosuvastatin (CRESTOR) 10 MG tablet Take 1 tablet by  mouth once daily   vitamin B-12 (CYANOCOBALAMIN) 500 MCG tablet Take 750 mcg by mouth daily.      Allergies:   Pravastatin   Social History   Tobacco Use   Smoking status: Former    Packs/day: 1.00    Years: 15.00    Pack years: 15.00    Types: Cigarettes    Quit date: 07/08/1971    Years since quitting: 50.0  Smokeless tobacco: Never  Vaping Use   Vaping Use: Never used  Substance Use Topics   Alcohol use: Yes    Comment: rare   Drug use: No     Family Hx: The patient's family history includes Alcoholism in his father and mother; Diabetes in his father.  ROS:   Please see the history of present illness.     All other systems reviewed and are negative.   Labs/Other Tests and Data Reviewed:    Recent Labs: 10/12/2020: ALT 17   Recent Lipid Panel Lab Results  Component Value Date/Time   CHOL 102 10/12/2020 08:28 AM   TRIG 125 10/12/2020 08:28 AM   HDL 40 10/12/2020 08:28 AM   CHOLHDL 2.6 10/12/2020 08:28 AM   CHOLHDL 3.1 10/04/2018 01:32 PM   LDLCALC 40 10/12/2020 08:28 AM    Wt Readings from Last 3 Encounters:  07/25/21 247 lb 9.6 oz (112.3 kg)  04/04/21 250 lb 9.6 oz (113.7 kg)  03/21/21 254 lb (115.2 kg)     Objective:    Vital Signs:  BP 128/78   Pulse 67   Ht 6' (1.829 m)   Wt 247 lb 9.6 oz (112.3 kg)   SpO2 94%   BMI 33.58 kg/m   GEN: Well nourished, well developed in no acute distress HEENT: Normal NECK: No JVD; No carotid bruits LYMPHATICS: No lymphadenopathy CARDIAC:RRR, no murmurs, rubs, gallops RESPIRATORY:  Clear to auscultation without rales, wheezing or rhonchi  ABDOMEN: Soft, non-tender, non-distended MUSCULOSKELETAL:  No edema; No deformity  SKIN: Warm and dry NEUROLOGIC:  Alert and oriented x 3 PSYCHIATRIC:  Normal affect    ASSESSMENT & PLAN:    1.  Paroxysmal atrial fibrillation  -this occurred in the setting of acute illness and has not had any further episodes and Amio was stopped -He is maintaining normal sinus rhythm  on exam and denies any palpitations -Continue prescription drug management Toprol-XL 25 mg daily with as needed refills  -Xarelto was stopped due to spontaneous thigh hematoma   2.  Coronary artery calcifications  -no ischemia on nuclear stress test 11/2018  -He has not had any anginal symptoms -Continue ASA 81 mg daily and statin   3.  Hyperlipidemia  -LDL goal < 70 due to coronary artery calcifications.   -I have personally reviewed and interpreted outside labs performed by patient's PCP which showed LDL 32, HDL 42, triglycerides 236 (non fasting) done 07/12/2021, ALT 20 -Continue prescription drug management with Crestor 10 mg daily    Medication Adjustments/Labs and Tests Ordered: Current medicines are reviewed at length with the patient today.  Concerns regarding medicines are outlined above.  Tests Ordered: Orders Placed This Encounter  Procedures   EKG 12-Lead    Medication Changes: No orders of the defined types were placed in this encounter.   Disposition:  Follow up in 6 month(s)  Signed, Fransico Him, MD  07/25/2021 11:20 AM    [facility]

## 2021-07-25 NOTE — Addendum Note (Signed)
Addended by: Antonieta Iba on: 07/25/2021 11:39 AM   Modules accepted: Orders

## 2021-08-07 DIAGNOSIS — M48061 Spinal stenosis, lumbar region without neurogenic claudication: Secondary | ICD-10-CM | POA: Diagnosis not present

## 2021-08-07 DIAGNOSIS — M5136 Other intervertebral disc degeneration, lumbar region: Secondary | ICD-10-CM | POA: Diagnosis not present

## 2021-08-09 DIAGNOSIS — M48061 Spinal stenosis, lumbar region without neurogenic claudication: Secondary | ICD-10-CM | POA: Diagnosis not present

## 2021-08-14 DIAGNOSIS — M48061 Spinal stenosis, lumbar region without neurogenic claudication: Secondary | ICD-10-CM | POA: Diagnosis not present

## 2021-08-15 DIAGNOSIS — M5416 Radiculopathy, lumbar region: Secondary | ICD-10-CM | POA: Diagnosis not present

## 2021-08-25 DIAGNOSIS — M48061 Spinal stenosis, lumbar region without neurogenic claudication: Secondary | ICD-10-CM | POA: Diagnosis not present

## 2021-08-31 DIAGNOSIS — M533 Sacrococcygeal disorders, not elsewhere classified: Secondary | ICD-10-CM | POA: Diagnosis not present

## 2021-09-01 DIAGNOSIS — M48061 Spinal stenosis, lumbar region without neurogenic claudication: Secondary | ICD-10-CM | POA: Diagnosis not present

## 2021-09-11 ENCOUNTER — Encounter: Payer: Self-pay | Admitting: Internal Medicine

## 2021-09-11 NOTE — Assessment & Plan Note (Signed)
Exam is consistent with sinus rhythm at this visit.  He follows with cardiology.

## 2021-09-11 NOTE — Assessment & Plan Note (Signed)
He benefits from CPAP with good compliance and control. Plan-continue auto 5-20

## 2021-09-12 DIAGNOSIS — Z111 Encounter for screening for respiratory tuberculosis: Secondary | ICD-10-CM | POA: Diagnosis not present

## 2021-09-12 DIAGNOSIS — Z79899 Other long term (current) drug therapy: Secondary | ICD-10-CM | POA: Diagnosis not present

## 2021-09-12 DIAGNOSIS — M0579 Rheumatoid arthritis with rheumatoid factor of multiple sites without organ or systems involvement: Secondary | ICD-10-CM | POA: Diagnosis not present

## 2021-09-12 DIAGNOSIS — R5383 Other fatigue: Secondary | ICD-10-CM | POA: Diagnosis not present

## 2021-09-13 DIAGNOSIS — M533 Sacrococcygeal disorders, not elsewhere classified: Secondary | ICD-10-CM | POA: Diagnosis not present

## 2021-09-14 DIAGNOSIS — H31091 Other chorioretinal scars, right eye: Secondary | ICD-10-CM | POA: Diagnosis not present

## 2021-09-14 DIAGNOSIS — H4051X2 Glaucoma secondary to other eye disorders, right eye, moderate stage: Secondary | ICD-10-CM | POA: Diagnosis not present

## 2021-09-14 DIAGNOSIS — E119 Type 2 diabetes mellitus without complications: Secondary | ICD-10-CM | POA: Diagnosis not present

## 2021-09-14 DIAGNOSIS — H35372 Puckering of macula, left eye: Secondary | ICD-10-CM | POA: Diagnosis not present

## 2021-09-14 DIAGNOSIS — H43812 Vitreous degeneration, left eye: Secondary | ICD-10-CM | POA: Diagnosis not present

## 2021-10-06 DIAGNOSIS — H43813 Vitreous degeneration, bilateral: Secondary | ICD-10-CM | POA: Diagnosis not present

## 2021-10-06 DIAGNOSIS — H31093 Other chorioretinal scars, bilateral: Secondary | ICD-10-CM | POA: Diagnosis not present

## 2021-10-06 DIAGNOSIS — H35373 Puckering of macula, bilateral: Secondary | ICD-10-CM | POA: Diagnosis not present

## 2021-11-07 DIAGNOSIS — M0579 Rheumatoid arthritis with rheumatoid factor of multiple sites without organ or systems involvement: Secondary | ICD-10-CM | POA: Diagnosis not present

## 2021-11-07 DIAGNOSIS — Z79899 Other long term (current) drug therapy: Secondary | ICD-10-CM | POA: Diagnosis not present

## 2021-11-12 DIAGNOSIS — Z20822 Contact with and (suspected) exposure to covid-19: Secondary | ICD-10-CM | POA: Diagnosis not present

## 2021-12-04 IMAGING — XA DG MYELOGRAPHY LUMBAR INJ LUMBOSACRAL
7 of 17 series · 7 of 17 positions shown · non-contrast
Comparison: MRI lumbar spine 01/16/2009

CLINICAL DATA: 80-year-old male with lumbar back pain and lumbar
radiculopathy. He presents for lumbar myelography.

[Series 1: w lumbar spine lat · 0.15mm/px · 1 of 1 slices shown]
[im 1/1]
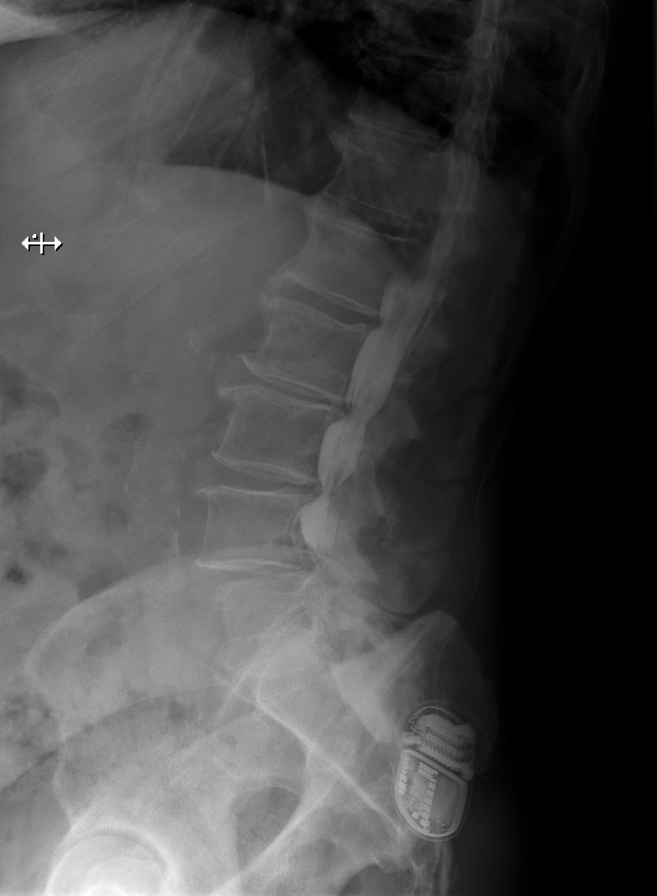

[Series 1: vasc adipose · 1 of 1 slices shown (1 of 4)]
[im 1/1]
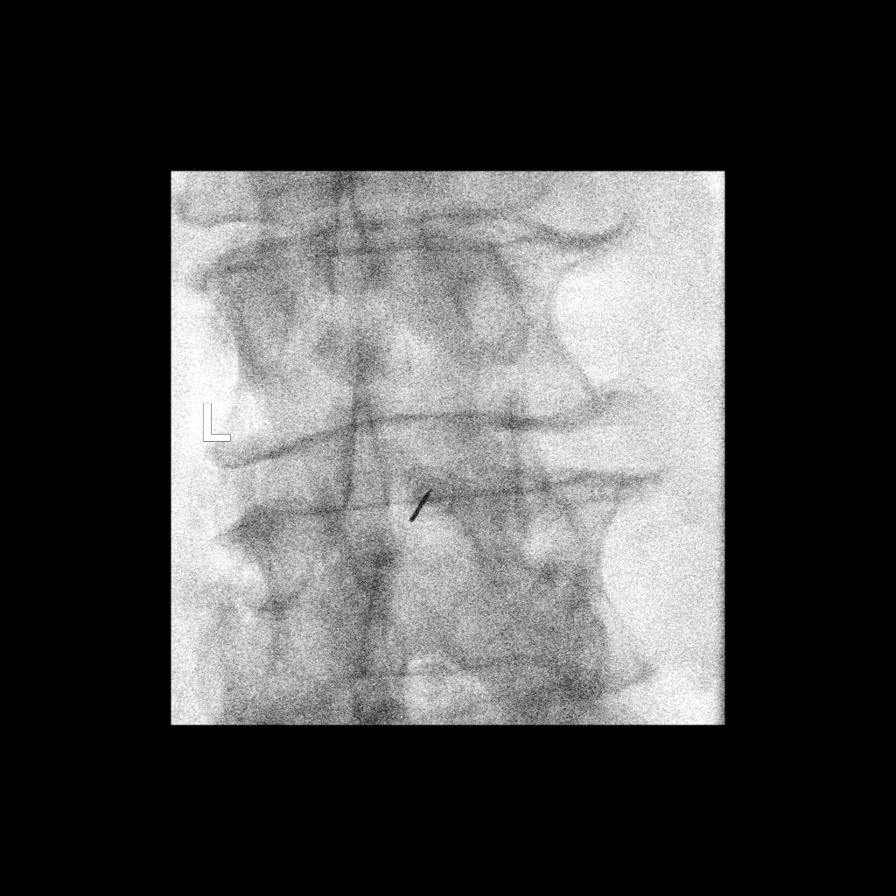

[Series 2: vasc adipose · 1 of 1 slices shown (2 of 4)]
[im 1/1]
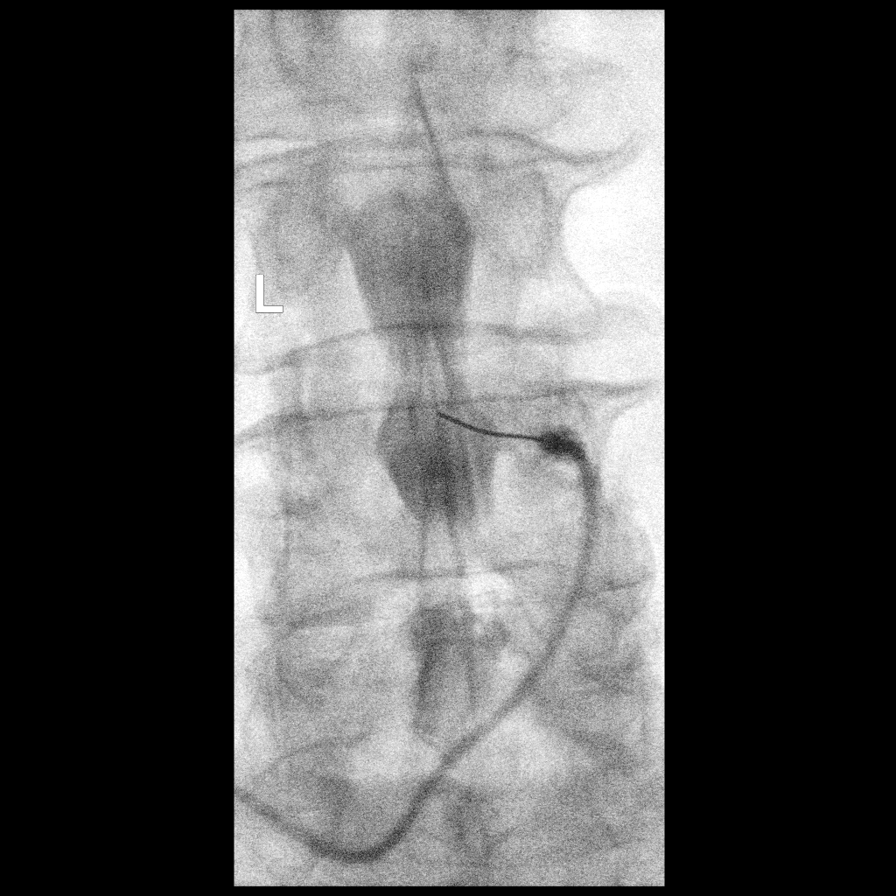

[Series 2: w lumbar spine flexion · 0.15mm/px · 1 of 1 slices shown]
[im 1/1]
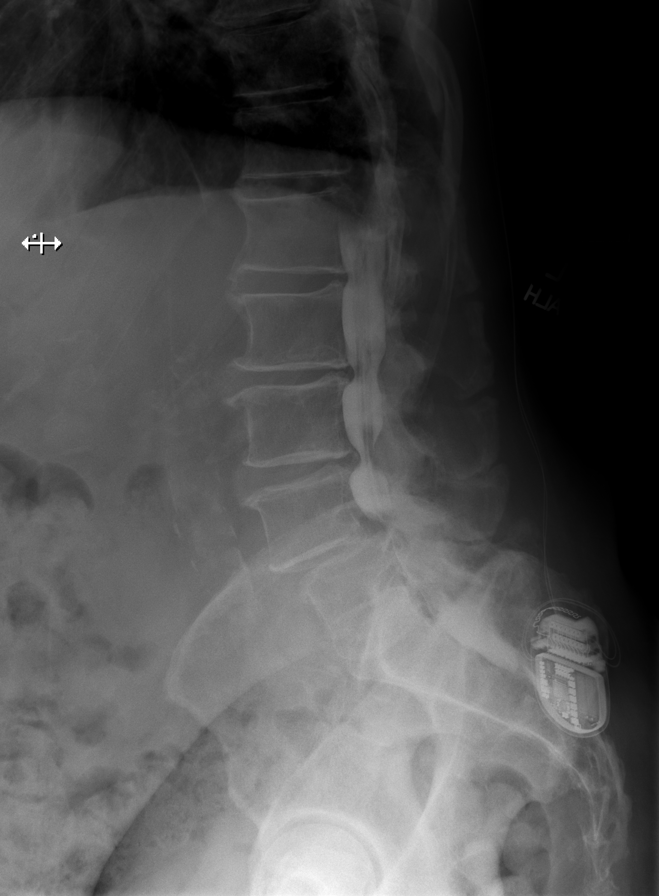

[Series 3: w lumbar spine extension · 0.15mm/px · 1 of 1 slices shown]
[im 1/1]
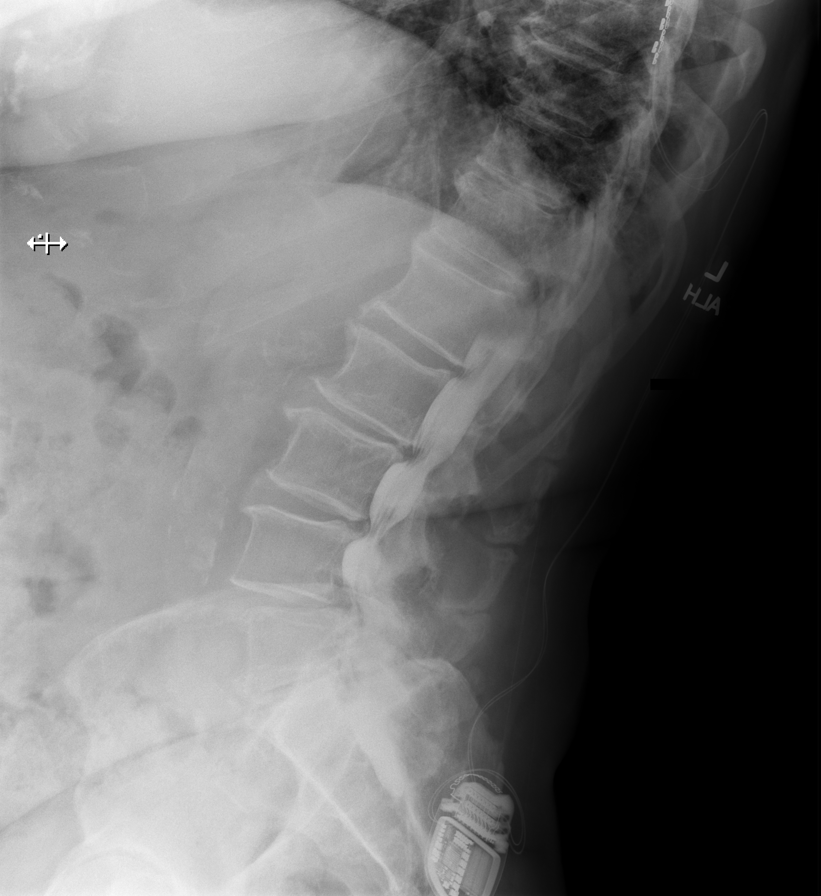

[Series 3: vasc adipose · 1 of 1 slices shown (3 of 4)]
[im 1/1]
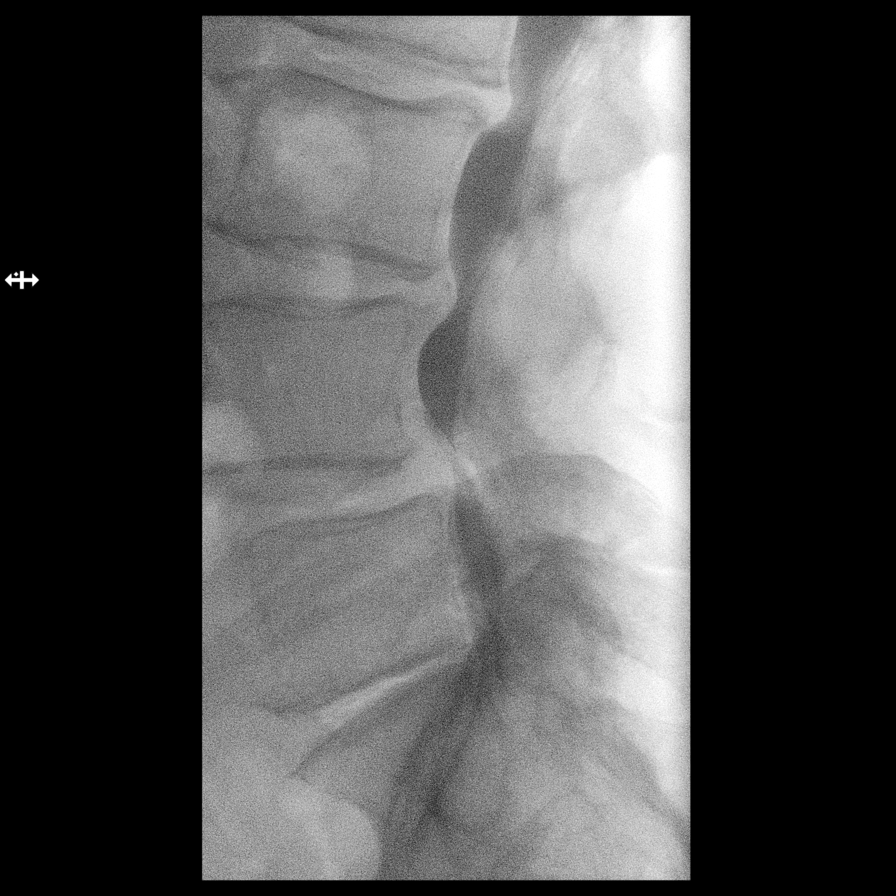

[Series 4: vasc adipose · 1 of 1 slices shown (4 of 4)]
[im 1/1]
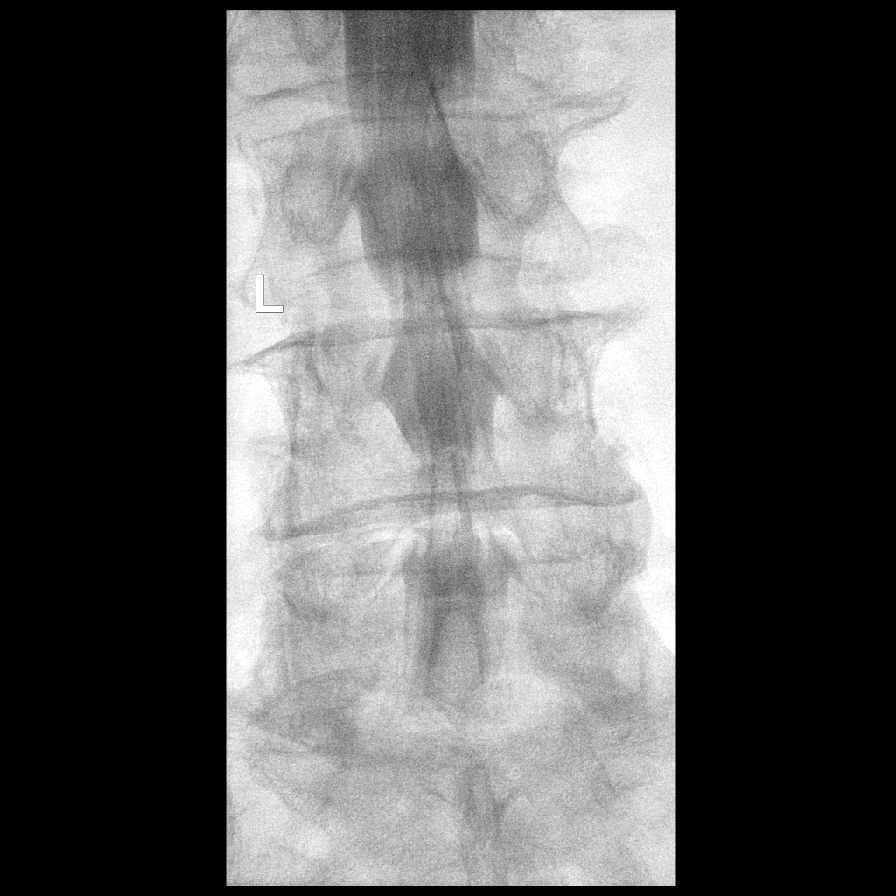

[7 of 17 positions shown; findings below may reference images not displayed]

FLUOROSCOPY TIME:  1 minute 43 seconds 32.5 mGy

PROCEDURE:
LUMBAR MYELOGRAM

After thorough discussion of risks and benefits of the procedure
including bleeding, infection, injury to nerves, blood vessels,
adjacent structures as well as headache and CSF leak, written and
oral informed consent was obtained. Consent was obtained by Dr.
Brandon Andre Paredes Apaza. Time out form was completed.

Patient was positioned prone on the fluoroscopy table. Local
anesthesia was provided with 1% lidocaine without epinephrine after
prepped and draped in the usual sterile fashion. Puncture was
performed at L3-L4 using a 3 1/2 inch 22-gauge spinal needle via
paramedian approach. Using a single pass through the dura, the
needle was placed within the thecal sac, with return of clear CSF.
15 mL of Isovue C-DCC was injected into the thecal sac, with normal
opacification of the nerve roots and cauda equina consistent with
free flow within the subarachnoid space.

I personally performed the lumbar puncture and administered the
intrathecal contrast. I also personally supervised acquisition of
the myelogram images.
FINDINGS: Alignment: Mild grade 1 anterolisthesis of L4 on L5 measures
approximately 3-4 mm. Anterolisthesis is exacerbated during flexion
to approximately 5 mm. Anterolisthesis partially corrects to
approximately 4 mm during extension.

Vertebra: No acute fracture.  No lytic or blastic osseous lesion.

L1-L2: Small ventral epidural defect.

L2-L3: Small ventral epidural defect.

L3-L4: Moderate ventral epidural defect with associated moderate
posterior epidural defect resulting in significant narrowing of the
CSF column.

L4-L5: Grade 1 anterolisthesis.  Moderate ventral epidural defect.

L5-S1: Normal.

Other: Atherosclerotic calcifications present in the abdominal
aorta.
IMPRESSION: 1. Successful L3-L4 lumbar puncture with instillation of intrathecal
contrast for CT myelography. Please see dedicated but separately
dictated CT myelogram for further detail.
2. Grade 1 anterolisthesis of L4 on L5 which is exacerbated by
flexion and slightly reduced in extension.
3. Multilevel degenerative changes with narrowing of the thecal
contrast column most severe at L3-L4 and L4-L5.
4. Aortic atherosclerotic vascular calcifications.

## 2021-12-11 ENCOUNTER — Encounter: Payer: Self-pay | Admitting: Cardiology

## 2021-12-11 MED ORDER — ROSUVASTATIN CALCIUM 10 MG PO TABS: 10.0000 mg | ORAL_TABLET | Freq: Every day | ORAL | 3 refills | Status: AC

## 2021-12-13 DIAGNOSIS — Z79899 Other long term (current) drug therapy: Secondary | ICD-10-CM | POA: Diagnosis not present

## 2021-12-13 DIAGNOSIS — Z6833 Body mass index (BMI) 33.0-33.9, adult: Secondary | ICD-10-CM | POA: Diagnosis not present

## 2021-12-13 DIAGNOSIS — M0579 Rheumatoid arthritis with rheumatoid factor of multiple sites without organ or systems involvement: Secondary | ICD-10-CM | POA: Diagnosis not present

## 2021-12-13 DIAGNOSIS — M1991 Primary osteoarthritis, unspecified site: Secondary | ICD-10-CM | POA: Diagnosis not present

## 2021-12-13 DIAGNOSIS — E669 Obesity, unspecified: Secondary | ICD-10-CM | POA: Diagnosis not present

## 2021-12-20 DIAGNOSIS — E119 Type 2 diabetes mellitus without complications: Secondary | ICD-10-CM | POA: Diagnosis not present

## 2021-12-20 DIAGNOSIS — H40022 Open angle with borderline findings, high risk, left eye: Secondary | ICD-10-CM | POA: Diagnosis not present

## 2021-12-20 DIAGNOSIS — H35372 Puckering of macula, left eye: Secondary | ICD-10-CM | POA: Diagnosis not present

## 2021-12-20 DIAGNOSIS — H4051X2 Glaucoma secondary to other eye disorders, right eye, moderate stage: Secondary | ICD-10-CM | POA: Diagnosis not present

## 2022-01-02 DIAGNOSIS — M0579 Rheumatoid arthritis with rheumatoid factor of multiple sites without organ or systems involvement: Secondary | ICD-10-CM | POA: Diagnosis not present

## 2022-01-05 ENCOUNTER — Encounter (HOSPITAL_COMMUNITY): Payer: Self-pay

## 2022-01-05 ENCOUNTER — Emergency Department (HOSPITAL_COMMUNITY): Payer: No Typology Code available for payment source

## 2022-01-05 ENCOUNTER — Other Ambulatory Visit: Payer: Self-pay

## 2022-01-05 ENCOUNTER — Emergency Department (HOSPITAL_COMMUNITY)
Admission: EM | Admit: 2022-01-05 | Discharge: 2022-01-05 | Disposition: A | Discharge status: Home / Self Care | Payer: No Typology Code available for payment source | Attending: Emergency Medicine | Admitting: Emergency Medicine

## 2022-01-05 DIAGNOSIS — M5459 Other low back pain: Secondary | ICD-10-CM | POA: Diagnosis not present

## 2022-01-05 DIAGNOSIS — Z7984 Long term (current) use of oral hypoglycemic drugs: Secondary | ICD-10-CM | POA: Insufficient documentation

## 2022-01-05 DIAGNOSIS — M545 Low back pain, unspecified: Secondary | ICD-10-CM | POA: Diagnosis not present

## 2022-01-05 DIAGNOSIS — Z7982 Long term (current) use of aspirin: Secondary | ICD-10-CM | POA: Insufficient documentation

## 2022-01-05 DIAGNOSIS — G8929 Other chronic pain: Secondary | ICD-10-CM | POA: Insufficient documentation

## 2022-01-05 DIAGNOSIS — E119 Type 2 diabetes mellitus without complications: Secondary | ICD-10-CM | POA: Diagnosis not present

## 2022-01-05 DIAGNOSIS — I4891 Unspecified atrial fibrillation: Secondary | ICD-10-CM | POA: Insufficient documentation

## 2022-01-05 DIAGNOSIS — M4316 Spondylolisthesis, lumbar region: Secondary | ICD-10-CM | POA: Diagnosis not present

## 2022-01-05 MED ORDER — OXYCODONE-ACETAMINOPHEN 5-325 MG PO TABS: 1.0000 | ORAL_TABLET | Freq: Four times a day (QID) | ORAL | 0 refills | Status: DC | PRN

## 2022-01-05 MED ORDER — LIDOCAINE 4 % EX PTCH: 1.0000 | MEDICATED_PATCH | CUTANEOUS | 0 refills | Status: AC

## 2022-01-05 MED ORDER — MORPHINE SULFATE (PF) 4 MG/ML IV SOLN
4.0000 mg | Freq: Once | INTRAVENOUS | Status: AC
  Administered 2022-01-05: 4 mg via INTRAVENOUS
  Filled 2022-01-05: qty 1

## 2022-01-05 MED ORDER — LIDOCAINE 5 % EX PTCH
1.0000 | MEDICATED_PATCH | CUTANEOUS | Status: DC
Start: 2022-01-05 — End: 2022-01-06
  Administered 2022-01-05: 1 via TRANSDERMAL
  Filled 2022-01-05: qty 1

## 2022-01-05 MED ORDER — OXYCODONE-ACETAMINOPHEN 5-325 MG PO TABS
1.0000 | ORAL_TABLET | Freq: Once | ORAL | Status: AC
Start: 2022-01-05 — End: 2022-01-05
  Administered 2022-01-05: 1 via ORAL
  Filled 2022-01-05: qty 1

## 2022-01-05 MED ORDER — HYDROMORPHONE HCL 1 MG/ML IJ SOLN
0.5000 mg | Freq: Once | INTRAMUSCULAR | Status: AC
  Administered 2022-01-05: 0.5 mg via INTRAVENOUS
  Filled 2022-01-05: qty 1

## 2022-01-05 NOTE — ED Triage Notes (Signed)
Patient c/o chronic mid lower back pain. Patient states his mid lower back is much worse today. ? ?Patient states he was suppose to get a shot on 12/26/21, but was not able to make the appointment. ? ?Patient states he took a Percocet 5/325 approx 1 1/2 hours ago. ?

## 2022-01-05 NOTE — ED Provider Notes (Signed)
?[place] DEPT ?Provider Note ? ? ?CSN: [id number] ?Arrival date & time: 01/05/22  1602 ? ?  ? ?History ? ?Chief Complaint  ?Patient presents with  ? Back Pain  ? ? ?Brian Ellison is a 81 y.o. male. ? ?Patient with history of diabetes, afib, and chronic back pain presents today with back pain. He states that over the past several days he has been more active than normal and yesterday he was urinating and coughed and had acute onset lower back pain. He states that he has had a history of back pain but states this is normally near his SI joint. He also states that he has gotten steroid injections in the past for management of this pain, however he was unable to get an appointment with his orthopedist until mid May. He states that this pain is in a different location from his normal chronic back pain, also states that today he has had increased difficulty walking due to pain. He is able to walk without assistance, and only states that his pain is triggered by twisting.  He denies any loss of bowel or bladder function or saddle paresthesias.  He does not smoke, drink alcohol, and denies recreational drug use or IVDU.  He denies any sharp shooting pain down his legs or numbness/tingling in his legs. ? ?The history is provided by the patient. No language interpreter was used.  ?Back Pain ?Associated symptoms: no fever   ? ?  ? ?Home Medications ?Prior to Admission medications   ?Medication Sig Start Date End Date Taking? Authorizing Provider  ?aspirin 81 MG EC tablet Take 1 tablet by mouth daily. 09/22/19   [provider]  ?brimonidine (ALPHAGAN) 0.15 % ophthalmic solution Place 1 drop into both eyes 2 (two) times daily.    [provider]  ?cetirizine (ZYRTEC) 10 MG tablet Take 10 mg by mouth daily. ?Patient not taking: Reported on 07/25/2021    [provider]  ?chlorpheniramine (CHLOR-TRIMETON) 4 MG tablet Take 4 mg by mouth.    [provider]   ?Cholecalciferol (VITAMIN D3 PO) Take 1 capsule by mouth daily.    [provider]  ?diclofenac (VOLTAREN) 75 MG EC tablet Take 75 mg by mouth daily. 02/21/21   [provider]  ?empagliflozin (JARDIANCE) 25 MG TABS tablet Take 25 mg by mouth daily. 02/28/21   [provider]  ?gabapentin (NEURONTIN) 300 MG capsule Take 900 mg by mouth daily.    [provider]  ?GLIPIZIDE PO Take 5 mg by mouth daily.     [provider]  ?golimumab 2 mg/kg in sodium chloride 0.9 % Inject 2 mg/kg into the vein every 8 (eight) weeks.    [provider]  ?metFORMIN (GLUCOPHAGE) 500 MG tablet Take 500 mg by mouth at bedtime.    [provider]  ?metoprolol succinate (TOPROL-XL) 25 MG 24 hr tablet Take 1 tablet (25 mg total) by mouth daily. 07/25/21   Sueanne Margarita, MD  ?pantoprazole (PROTONIX) 40 MG tablet Take 40 mg by mouth daily.    [provider]  ?potassium chloride (K-DUR) 10 MEQ tablet Take 1 tablet (10 mEq total) by mouth daily. 11/12/18   Fenton, Clint R, PA  ?rOPINIRole (REQUIP) 1 MG tablet Take 1 mg by mouth at bedtime.      [provider]  ?rosuvastatin (CRESTOR) 10 MG tablet Take 1 tablet (10 mg total) by mouth daily. 12/11/21   Sueanne Margarita, MD  ?vitamin B-12 (  CYANOCOBALAMIN) 500 MCG tablet Take 750 mcg by mouth daily.     [provider]  ?   ? ?Allergies    ?Pravastatin   ? ?Review of Systems   ?Review of Systems  ?Constitutional:  Negative for chills and fever.  ?Genitourinary:  Negative for decreased urine volume.  ?Musculoskeletal:  Positive for back pain.  ?Skin:  Negative for rash.  ?All other systems reviewed and are negative. ? ?Physical Exam ?Updated Vital Signs ?BP (!) 142/83   Pulse (!) 59   Temp 98.8 ?F (37.1 ?C) (Oral)   Resp 17   Ht 6' (1.829 m)   Wt 111.1 kg   SpO2 93%   BMI 33.23 kg/m?  ?Physical Exam ?Vitals and nursing note reviewed.  ?Constitutional:   ?   General: He is not in acute distress. ?    Appearance: Normal appearance. He is normal weight. He is not ill-appearing, toxic-appearing or diaphoretic.  ?HENT:  ?   Head: Normocephalic and atraumatic.  ?Eyes:  ?   Extraocular Movements: Extraocular movements intact.  ?   Pupils: Pupils are equal, round, and reactive to light.  ?Cardiovascular:  ?   Rate and Rhythm: Normal rate and regular rhythm.  ?Pulmonary:  ?   Effort: Pulmonary effort is normal. No respiratory distress.  ?   Breath sounds: Normal breath sounds.  ?Abdominal:  ?   General: Abdomen is flat.  ?   Palpations: Abdomen is soft.  ?Musculoskeletal:     ?   General: Normal range of motion.  ?   Cervical back: Normal range of motion.  ?   Comments: Mild midline lumbar spine tenderness with associated palpable bilateral paraspinous tenderness.  No visualized overlying skin abnormalities, warmth, step-offs, or other abnormality.  Negative straight leg raise.  Patient able to ambulate with some pain.  ?Skin: ?   General: Skin is warm and dry.  ?Neurological:  ?   General: No focal deficit present.  ?   Mental Status: He is alert and oriented to person, place, and time.  ?   Gait: Gait normal.  ?Psychiatric:     ?   Mood and Affect: Mood normal.     ?   Behavior: Behavior normal.  ? ? ?ED Results / Procedures / Treatments   ?Labs ?(all labs ordered are listed, but only abnormal results are displayed) ?Labs Reviewed - No data to display ? ?EKG ?None ? ?Radiology ?CT Lumbar Spine Wo Contrast ? ?Result Date: 01/05/2022 ?CLINICAL DATA:  Low back pain, increased fracture risk. EXAM: CT LUMBAR SPINE WITHOUT CONTRAST TECHNIQUE: Multidetector CT imaging of the lumbar spine was performed without intravenous contrast administration. Multiplanar CT image reconstructions were also generated. RADIATION DOSE REDUCTION: This exam was performed according to the departmental dose-optimization program which includes automated exposure control, adjustment of the mA and/or kV according to patient size and/or use of  iterative reconstruction technique. COMPARISON:  CT examination dated July 12, 2021 FINDINGS: Segmentation: 5 lumbar type vertebrae. Alignment: Mild retrolisthesis of L2 on L3. 5 mm anterolisthesis of L4 on L5. Mild retrolisthesis of L5 on S1. Vertebrae: No acute fracture or focal pathologic process. Paraspinal and other soft tissues: Negative. Disc levels: T12-L1: Disc osteophyte complex without significant spinal canal or neural foraminal stenosis. L1-L2: Disc height loss with mild spinal canal stenosis. Moderate facet joint arthropathy. No significant neural foraminal stenosis. L2-L3: Disc height loss with vacuum disc phenomena. Disc osteophyte complex with moderate spinal canal stenosis prominent narrowing of bilateral lateral recesses.  No significant neural foraminal stenosis. Moderate facet joint arthropathy. L3-L4: Disc osteophyte complex with moderate spinal canal stenosis. Moderate facet joint arthropathy. No significant neural foraminal stenosis. L4-L5: Disc osteophyte complex with moderate spinal canal stenosis. Moderate to severe facet joint arthropathy. Mild bilateral neural foraminal stenosis. L5-S1: Asymmetric disc protrusion to the left with moderate left lateral recess stenosis. Moderate bilateral facet joint arthropathy. Mild left neural foraminal stenosis. IMPRESSION: 1. No evidence of acute fracture or traumatic subluxation. Chronic anterolisthesis of L4 and mild retrolisthesis of L5 and L2, unchanged. 2. Advanced multilevel degenerate disc disease most prominent at L2-L3 L3-L4 and L4-L5. Moderate facet joint arthropathy at multiple levels as detailed above. Electronically Signed   By: Keane Police D.O.   On: 01/05/2022 18:43   ? ?Procedures ?Procedures  ? ? ?Medications Ordered in ED ?Medications  ?lidocaine (LIDODERM) 5 % 1 patch (1 patch Transdermal Patch Applied 01/05/22 1906)  ?morphine (PF) 4 MG/ML injection 4 mg (4 mg Intravenous Given 01/05/22 1741)  ?HYDROmorphone (DILAUDID) injection  0.5 mg (0.5 mg Intravenous Given 01/05/22 1938)  ?oxyCODONE-acetaminophen (PERCOCET/ROXICET) 5-325 MG per tablet 1 tablet (1 tablet Oral Given 01/05/22 2056)  ? ? ?ED Course/ Medical Decision Making/ A&P ?  ?

## 2022-01-05 NOTE — ED Provider Notes (Signed)
I provided a substantive portion of the care of this patient.  I personally performed the entirety of the history, exam, and medical decision making for this encounter. ? ?  ? ?Patient with acute onset of severe pain in the lower mid part of the back.  After sneezing or coughing yesterday it came on suddenly.  Always had a little bit of chronic pain in the area but always pretty functional.  Patient had some Percocet at home left for many years ago took that last night did help so that he could sleep.  But still having pain.  No radiation of the pain into his legs.  No numbness or weakness to his lower extremities.  No incontinence. ? ?Patient treated here with 4 mg of morphine with a little bit of help but still having pain.  Patient used to be followed by [facility] but has not seen them for a long period of time. ? ?Patient has good movement of his toes and feet and sensations intact.  Think patient could be treated symptomatically with pain control.  Probably on to have a formal prescription of Percocet and then follow-up with his doctor. ? ?CT scan was done which showed no evidence of any acute fracture or traumatic subluxation.  But does have chronic anterior listhesis of L4 mild retrolisthesis L5 L2 which is unchanged as advanced multilevel degenerative disc disease most prominent at L2-3-4 and 5.  So patient has plenty of reasons to have some pain in that area.  Currently no evidence of any radicular pain. ?  ?Fredia Sorrow, MD ?01/05/22 1922 ? ?

## 2022-01-05 NOTE — Discharge Instructions (Signed)
As we discussed, your work-up in the ER today was reassuring for acute abnormalities.  I have given you a prescription for narcotic pain medication for you to take to help you sleep at night.  Please do not drive or operate heavy machinery after taking this medication as it can be sedating.  Please follow-up with your primary care doctor and the specialist that usually manages your chronic back pain for further evaluation and management of your symptoms. ? ?Return if development of any new or worsening symptoms. ?

## 2022-01-25 DIAGNOSIS — M5416 Radiculopathy, lumbar region: Secondary | ICD-10-CM | POA: Diagnosis not present

## 2022-02-27 DIAGNOSIS — M0579 Rheumatoid arthritis with rheumatoid factor of multiple sites without organ or systems involvement: Secondary | ICD-10-CM | POA: Diagnosis not present

## 2022-03-06 DIAGNOSIS — X32XXXD Exposure to sunlight, subsequent encounter: Secondary | ICD-10-CM | POA: Diagnosis not present

## 2022-03-06 DIAGNOSIS — D0439 Carcinoma in situ of skin of other parts of face: Secondary | ICD-10-CM | POA: Diagnosis not present

## 2022-03-06 DIAGNOSIS — L570 Actinic keratosis: Secondary | ICD-10-CM | POA: Diagnosis not present

## 2022-03-06 DIAGNOSIS — L820 Inflamed seborrheic keratosis: Secondary | ICD-10-CM | POA: Diagnosis not present

## 2022-03-19 DIAGNOSIS — G4733 Obstructive sleep apnea (adult) (pediatric): Secondary | ICD-10-CM | POA: Diagnosis not present

## 2022-03-19 DIAGNOSIS — F4320 Adjustment disorder, unspecified: Secondary | ICD-10-CM | POA: Diagnosis not present

## 2022-03-19 DIAGNOSIS — E785 Hyperlipidemia, unspecified: Secondary | ICD-10-CM | POA: Diagnosis not present

## 2022-03-19 DIAGNOSIS — R079 Chest pain, unspecified: Secondary | ICD-10-CM | POA: Diagnosis not present

## 2022-03-19 DIAGNOSIS — G2581 Restless legs syndrome: Secondary | ICD-10-CM | POA: Diagnosis not present

## 2022-03-19 DIAGNOSIS — Z0000 Encounter for general adult medical examination without abnormal findings: Secondary | ICD-10-CM | POA: Diagnosis not present

## 2022-03-19 DIAGNOSIS — E119 Type 2 diabetes mellitus without complications: Secondary | ICD-10-CM | POA: Diagnosis not present

## 2022-03-19 DIAGNOSIS — K219 Gastro-esophageal reflux disease without esophagitis: Secondary | ICD-10-CM | POA: Diagnosis not present

## 2022-03-19 DIAGNOSIS — I10 Essential (primary) hypertension: Secondary | ICD-10-CM | POA: Diagnosis not present

## 2022-03-30 DIAGNOSIS — R195 Other fecal abnormalities: Secondary | ICD-10-CM | POA: Diagnosis not present

## 2022-03-30 DIAGNOSIS — E1159 Type 2 diabetes mellitus with other circulatory complications: Secondary | ICD-10-CM | POA: Diagnosis not present

## 2022-03-30 DIAGNOSIS — A0472 Enterocolitis due to Clostridium difficile, not specified as recurrent: Secondary | ICD-10-CM | POA: Diagnosis not present

## 2022-04-03 DIAGNOSIS — M25511 Pain in right shoulder: Secondary | ICD-10-CM | POA: Diagnosis not present

## 2022-04-03 DIAGNOSIS — R079 Chest pain, unspecified: Secondary | ICD-10-CM | POA: Diagnosis not present

## 2022-04-04 ENCOUNTER — Other Ambulatory Visit: Payer: Self-pay | Admitting: Orthopedic Surgery

## 2022-04-04 ENCOUNTER — Ambulatory Visit
Admission: RE | Admit: 2022-04-04 | Discharge: 2022-04-04 | Disposition: A | Discharge status: Home / Self Care | Payer: Medicare Other | Source: Ambulatory Visit | Attending: Orthopedic Surgery | Admitting: Orthopedic Surgery

## 2022-04-04 ENCOUNTER — Other Ambulatory Visit: Payer: Medicare Other

## 2022-04-04 DIAGNOSIS — R079 Chest pain, unspecified: Secondary | ICD-10-CM

## 2022-04-04 DIAGNOSIS — J439 Emphysema, unspecified: Secondary | ICD-10-CM | POA: Diagnosis not present

## 2022-04-04 DIAGNOSIS — R0789 Other chest pain: Secondary | ICD-10-CM | POA: Diagnosis not present

## 2022-04-10 DIAGNOSIS — M47816 Spondylosis without myelopathy or radiculopathy, lumbar region: Secondary | ICD-10-CM | POA: Diagnosis not present

## 2022-04-19 DIAGNOSIS — K219 Gastro-esophageal reflux disease without esophagitis: Secondary | ICD-10-CM | POA: Diagnosis not present

## 2022-04-19 DIAGNOSIS — G4733 Obstructive sleep apnea (adult) (pediatric): Secondary | ICD-10-CM | POA: Diagnosis not present

## 2022-04-19 DIAGNOSIS — R197 Diarrhea, unspecified: Secondary | ICD-10-CM | POA: Diagnosis not present

## 2022-04-19 DIAGNOSIS — G2581 Restless legs syndrome: Secondary | ICD-10-CM | POA: Diagnosis not present

## 2022-04-19 DIAGNOSIS — M5136 Other intervertebral disc degeneration, lumbar region: Secondary | ICD-10-CM | POA: Diagnosis not present

## 2022-04-24 DIAGNOSIS — Z79899 Other long term (current) drug therapy: Secondary | ICD-10-CM | POA: Diagnosis not present

## 2022-04-24 DIAGNOSIS — R195 Other fecal abnormalities: Secondary | ICD-10-CM | POA: Diagnosis not present

## 2022-04-24 DIAGNOSIS — M0579 Rheumatoid arthritis with rheumatoid factor of multiple sites without organ or systems involvement: Secondary | ICD-10-CM | POA: Diagnosis not present

## 2022-04-24 DIAGNOSIS — R197 Diarrhea, unspecified: Secondary | ICD-10-CM | POA: Diagnosis not present

## 2022-05-01 ENCOUNTER — Encounter: Payer: Self-pay | Admitting: Internal Medicine

## 2022-05-02 NOTE — Progress Notes (Signed)
Subjective:    Patient ID: Brian Mosses Sr., male    DOB: [birth year], 81 y.o.   MRN: [id number]  HPI male former smoker followed for OSA, restless legs, complicated by obesity, HBP, allergic rhinitis, GERD, glaucoma, DM 2 NPSG 01/11/06- AHI 104/ hr, desaturation to 68%  ------------------------------------------------------------------------------------------------------------   04/04/21- 81 year old male former smoker (15 pkyrs)followed for OSA, restless legs, complicated by obesity, HBP, allergic rhinitis, GERD, Glaucoma, DM 2, Hyperlipidemia, PAFib/amiodarone, CAD,  CPAP auto 5-20/  [employer] Download- compliance 100%, AHI 3.1/ hr Body weight today-250 lbs Covid vax-4 Moderna Download reviewed.  He has no concerns and feels he is sleeping better because he uses CPAP. Denies interval health concerns.  05/03/22- 81 year old male former smoker (15 pkyrs) followed for OSA, restless legs, complicated by obesity, HBP, allergic rhinitis, GERD, Glaucoma, DM 2, Hyperlipidemia, PAFib/amiodarone, CAD,  CPAP auto 5-20/  [facility] Download- compliance 97%, AHI 2.3/ hr Body weight today- Covid vax-4 Moderna Wife just died in 12/04/22.  He is trying to adjust but it has impacted his sleep. Download reviewed.  Daughter is living with him now.  Son tells him his breathing is "noisy" through CPAP.  We discussed Dawna Part as an option.  He has a pain stimulator in his back which may interfere. CT chest 04/04/22 for R chest pain IMPRESSION: 1. Moderate atheromatous calcifications of the thoracic aorta of the coronary arteries. Mild cardiomegaly.  2. No focal consolidation, or discrete new pulmonary nodule seen. Couple of subcentimeter ground-glass opacity nodules in the right lung are stable.  ROS-see HPI   + = positive Constitutional:    weight loss, night sweats, fevers, chills, fatigue, lassitude. HEENT:    headaches, difficulty swallowing, tooth/dental problems, sore throat,       sneezing,  itching, ear ache, nasal congestion, post nasal drip, snoring CV:    chest pain, orthopnea, PND, swelling in lower extremities, anasarca,                                   dizziness, palpitations Resp:   shortness of breath with exertion or at rest.                productive cough,   non-productive cough, coughing up of blood.              change in color of mucus.  wheezing.   Skin:    rash or lesions. GI:  No-   heartburn, indigestion, abdominal pain, nausea, vomiting, diarrhea,                 change in bowel habits, loss of appetite GU: dysuria, change in color of urine, no urgency or frequency.   flank pain. MS:   joint pain, stiffness, decreased range of motion, back pain. Neuro-     nothing unusual Psych:  change in mood or affect.  depression or anxiety.   memory loss.   Objective:  OBJ- Physical Exam General- Alert, Oriented, Affect-appropriate, Distress- none acute, +obese Skin- + scabs R forearm Lymphadenopathy- none Head- atraumatic            Eyes- Gross vision intact, PERRLA, conjunctivae and secretions clear            Ears- Hearing, canals-normal            Nose- clear, no-Septal dev, mucus, polyps, erosion, perforation             Throat- Mallampati  III , mucosa clear , drainage- none, tonsils- atrophic Neck- flexible , trachea midline, no stridor , thyroid nl, carotid no bruit Chest - symmetrical excursion , unlabored           Heart/CV- RRR to exam , no murmur , no gallop  , no rub, nl s1 s2                           - JVD- none , edema- none, stasis changes- none, varices- none           Lung- clear to P&A, wheeze- none, cough- none , dullness-none, rub- none           Chest wall-  Abd-  Br/ Gen/ Rectal- Not done, not indicated Extrem- +R TKR scar Neuro- grossly intact to observation         Assessment & Plan:

## 2022-05-03 ENCOUNTER — Encounter: Payer: Self-pay | Admitting: Internal Medicine

## 2022-05-03 ENCOUNTER — Ambulatory Visit (INDEPENDENT_AMBULATORY_CARE_PROVIDER_SITE_OTHER): Payer: Medicare Other | Admitting: Internal Medicine

## 2022-05-03 VITALS — BP 136/70 | HR 84 | Ht 72.0 in | Wt 250.0 lb

## 2022-05-03 DIAGNOSIS — I480 Paroxysmal atrial fibrillation: Secondary | ICD-10-CM | POA: Diagnosis not present

## 2022-05-03 DIAGNOSIS — G4733 Obstructive sleep apnea (adult) (pediatric): Secondary | ICD-10-CM

## 2022-05-03 NOTE — Patient Instructions (Signed)
Order- DME [employer]- please provide chin strap.  Continue auto 5-20, mask of choice, humidifier, supplies, AirView/ card  Please call if we can help

## 2022-05-04 DIAGNOSIS — S0990XA Unspecified injury of head, initial encounter: Secondary | ICD-10-CM | POA: Diagnosis not present

## 2022-05-04 DIAGNOSIS — E119 Type 2 diabetes mellitus without complications: Secondary | ICD-10-CM | POA: Diagnosis not present

## 2022-05-04 DIAGNOSIS — R220 Localized swelling, mass and lump, head: Secondary | ICD-10-CM | POA: Diagnosis not present

## 2022-05-04 DIAGNOSIS — R519 Headache, unspecified: Secondary | ICD-10-CM | POA: Diagnosis not present

## 2022-05-04 DIAGNOSIS — S0512XA Contusion of eyeball and orbital tissues, left eye, initial encounter: Secondary | ICD-10-CM | POA: Diagnosis not present

## 2022-05-04 DIAGNOSIS — Z23 Encounter for immunization: Secondary | ICD-10-CM | POA: Diagnosis not present

## 2022-05-04 DIAGNOSIS — I10 Essential (primary) hypertension: Secondary | ICD-10-CM | POA: Diagnosis not present

## 2022-05-04 DIAGNOSIS — S0181XA Laceration without foreign body of other part of head, initial encounter: Secondary | ICD-10-CM | POA: Diagnosis not present

## 2022-05-04 DIAGNOSIS — S01112A Laceration without foreign body of left eyelid and periocular area, initial encounter: Secondary | ICD-10-CM | POA: Diagnosis not present

## 2022-05-04 DIAGNOSIS — W1830XA Fall on same level, unspecified, initial encounter: Secondary | ICD-10-CM | POA: Diagnosis not present

## 2022-05-04 DIAGNOSIS — S098XXA Other specified injuries of head, initial encounter: Secondary | ICD-10-CM | POA: Diagnosis not present

## 2022-05-09 DIAGNOSIS — S01112A Laceration without foreign body of left eyelid and periocular area, initial encounter: Secondary | ICD-10-CM | POA: Diagnosis not present

## 2022-05-10 DIAGNOSIS — S0512XA Contusion of eyeball and orbital tissues, left eye, initial encounter: Secondary | ICD-10-CM | POA: Diagnosis not present

## 2022-05-10 DIAGNOSIS — H04123 Dry eye syndrome of bilateral lacrimal glands: Secondary | ICD-10-CM | POA: Diagnosis not present

## 2022-05-15 DIAGNOSIS — M79632 Pain in left forearm: Secondary | ICD-10-CM | POA: Diagnosis not present

## 2022-05-15 DIAGNOSIS — S63502A Unspecified sprain of left wrist, initial encounter: Secondary | ICD-10-CM | POA: Diagnosis not present

## 2022-05-15 DIAGNOSIS — M7989 Other specified soft tissue disorders: Secondary | ICD-10-CM | POA: Diagnosis not present

## 2022-05-15 DIAGNOSIS — W19XXXA Unspecified fall, initial encounter: Secondary | ICD-10-CM | POA: Diagnosis not present

## 2022-05-15 DIAGNOSIS — M19032 Primary osteoarthritis, left wrist: Secondary | ICD-10-CM | POA: Diagnosis not present

## 2022-05-15 DIAGNOSIS — S5012XA Contusion of left forearm, initial encounter: Secondary | ICD-10-CM | POA: Diagnosis not present

## 2022-05-16 NOTE — Assessment & Plan Note (Signed)
He does benefit from CPAP with good compliance and control although son tells him sometimes breathing is "noisy".  We do not know if this reflects mask leak at times. Plan-add chinstrap

## 2022-05-16 NOTE — Assessment & Plan Note (Signed)
Continues working with cardiology 

## 2022-05-20 ENCOUNTER — Encounter: Payer: Self-pay | Admitting: Internal Medicine

## 2022-06-04 DIAGNOSIS — K5900 Constipation, unspecified: Secondary | ICD-10-CM | POA: Diagnosis not present

## 2022-06-04 DIAGNOSIS — R197 Diarrhea, unspecified: Secondary | ICD-10-CM | POA: Diagnosis not present

## 2022-06-05 ENCOUNTER — Other Ambulatory Visit: Payer: Self-pay | Admitting: Physician Assistant

## 2022-06-05 ENCOUNTER — Ambulatory Visit
Admission: RE | Admit: 2022-06-05 | Discharge: 2022-06-05 | Disposition: A | Discharge status: Home / Self Care | Payer: Medicare Other | Source: Ambulatory Visit | Attending: Physician Assistant | Admitting: Physician Assistant

## 2022-06-05 DIAGNOSIS — K5900 Constipation, unspecified: Secondary | ICD-10-CM

## 2022-06-05 DIAGNOSIS — R109 Unspecified abdominal pain: Secondary | ICD-10-CM | POA: Diagnosis not present

## 2022-06-05 DIAGNOSIS — R197 Diarrhea, unspecified: Secondary | ICD-10-CM | POA: Diagnosis not present

## 2022-06-14 DIAGNOSIS — M0579 Rheumatoid arthritis with rheumatoid factor of multiple sites without organ or systems involvement: Secondary | ICD-10-CM | POA: Diagnosis not present

## 2022-06-14 DIAGNOSIS — E669 Obesity, unspecified: Secondary | ICD-10-CM | POA: Diagnosis not present

## 2022-06-14 DIAGNOSIS — Z79899 Other long term (current) drug therapy: Secondary | ICD-10-CM | POA: Diagnosis not present

## 2022-06-14 DIAGNOSIS — M1991 Primary osteoarthritis, unspecified site: Secondary | ICD-10-CM | POA: Diagnosis not present

## 2022-06-14 DIAGNOSIS — Z6833 Body mass index (BMI) 33.0-33.9, adult: Secondary | ICD-10-CM | POA: Diagnosis not present

## 2022-06-19 DIAGNOSIS — M5136 Other intervertebral disc degeneration, lumbar region: Secondary | ICD-10-CM | POA: Diagnosis not present

## 2022-06-19 DIAGNOSIS — M0579 Rheumatoid arthritis with rheumatoid factor of multiple sites without organ or systems involvement: Secondary | ICD-10-CM | POA: Diagnosis not present

## 2022-06-19 DIAGNOSIS — M5416 Radiculopathy, lumbar region: Secondary | ICD-10-CM | POA: Diagnosis not present

## 2022-06-21 DIAGNOSIS — Z23 Encounter for immunization: Secondary | ICD-10-CM | POA: Diagnosis not present

## 2022-06-25 DIAGNOSIS — H40022 Open angle with borderline findings, high risk, left eye: Secondary | ICD-10-CM | POA: Diagnosis not present

## 2022-06-25 DIAGNOSIS — H4051X2 Glaucoma secondary to other eye disorders, right eye, moderate stage: Secondary | ICD-10-CM | POA: Diagnosis not present

## 2022-06-27 ENCOUNTER — Other Ambulatory Visit: Payer: Self-pay | Admitting: Physician Assistant

## 2022-06-27 ENCOUNTER — Ambulatory Visit
Admission: RE | Admit: 2022-06-27 | Discharge: 2022-06-27 | Disposition: A | Discharge status: Home / Self Care | Payer: Medicare Other | Source: Ambulatory Visit | Attending: Physician Assistant | Admitting: Physician Assistant

## 2022-06-27 DIAGNOSIS — R197 Diarrhea, unspecified: Secondary | ICD-10-CM | POA: Diagnosis not present

## 2022-06-27 DIAGNOSIS — R109 Unspecified abdominal pain: Secondary | ICD-10-CM | POA: Diagnosis not present

## 2022-06-27 DIAGNOSIS — K5900 Constipation, unspecified: Secondary | ICD-10-CM

## 2022-06-27 DIAGNOSIS — K219 Gastro-esophageal reflux disease without esophagitis: Secondary | ICD-10-CM | POA: Diagnosis not present

## 2022-07-18 DIAGNOSIS — L821 Other seborrheic keratosis: Secondary | ICD-10-CM | POA: Diagnosis not present

## 2022-07-18 DIAGNOSIS — C44219 Basal cell carcinoma of skin of left ear and external auricular canal: Secondary | ICD-10-CM | POA: Diagnosis not present

## 2022-07-18 DIAGNOSIS — D225 Melanocytic nevi of trunk: Secondary | ICD-10-CM | POA: Diagnosis not present

## 2022-07-18 DIAGNOSIS — L570 Actinic keratosis: Secondary | ICD-10-CM | POA: Diagnosis not present

## 2022-07-18 DIAGNOSIS — X32XXXD Exposure to sunlight, subsequent encounter: Secondary | ICD-10-CM | POA: Diagnosis not present

## 2022-07-23 ENCOUNTER — Ambulatory Visit: Payer: No Typology Code available for payment source | Admitting: Nurse Practitioner

## 2022-07-24 DIAGNOSIS — E785 Hyperlipidemia, unspecified: Secondary | ICD-10-CM | POA: Diagnosis not present

## 2022-07-24 DIAGNOSIS — J439 Emphysema, unspecified: Secondary | ICD-10-CM | POA: Diagnosis not present

## 2022-07-24 DIAGNOSIS — I10 Essential (primary) hypertension: Secondary | ICD-10-CM | POA: Diagnosis not present

## 2022-07-24 DIAGNOSIS — E119 Type 2 diabetes mellitus without complications: Secondary | ICD-10-CM | POA: Diagnosis not present

## 2022-07-30 DIAGNOSIS — M542 Cervicalgia: Secondary | ICD-10-CM | POA: Diagnosis not present

## 2022-07-30 DIAGNOSIS — Z6832 Body mass index (BMI) 32.0-32.9, adult: Secondary | ICD-10-CM | POA: Diagnosis not present

## 2022-08-06 DIAGNOSIS — K5900 Constipation, unspecified: Secondary | ICD-10-CM | POA: Diagnosis not present

## 2022-08-06 DIAGNOSIS — K219 Gastro-esophageal reflux disease without esophagitis: Secondary | ICD-10-CM | POA: Diagnosis not present

## 2022-08-06 DIAGNOSIS — R142 Eructation: Secondary | ICD-10-CM | POA: Diagnosis not present

## 2022-08-07 DIAGNOSIS — M531 Cervicobrachial syndrome: Secondary | ICD-10-CM | POA: Diagnosis not present

## 2022-08-07 DIAGNOSIS — R293 Abnormal posture: Secondary | ICD-10-CM | POA: Diagnosis not present

## 2022-08-07 DIAGNOSIS — M542 Cervicalgia: Secondary | ICD-10-CM | POA: Diagnosis not present

## 2022-08-09 DIAGNOSIS — R293 Abnormal posture: Secondary | ICD-10-CM | POA: Diagnosis not present

## 2022-08-09 DIAGNOSIS — M542 Cervicalgia: Secondary | ICD-10-CM | POA: Diagnosis not present

## 2022-08-09 DIAGNOSIS — M531 Cervicobrachial syndrome: Secondary | ICD-10-CM | POA: Diagnosis not present

## 2022-08-14 DIAGNOSIS — M531 Cervicobrachial syndrome: Secondary | ICD-10-CM | POA: Diagnosis not present

## 2022-08-14 DIAGNOSIS — R293 Abnormal posture: Secondary | ICD-10-CM | POA: Diagnosis not present

## 2022-08-14 DIAGNOSIS — M542 Cervicalgia: Secondary | ICD-10-CM | POA: Diagnosis not present

## 2022-08-15 DIAGNOSIS — M0579 Rheumatoid arthritis with rheumatoid factor of multiple sites without organ or systems involvement: Secondary | ICD-10-CM | POA: Diagnosis not present

## 2022-08-16 DIAGNOSIS — M542 Cervicalgia: Secondary | ICD-10-CM | POA: Diagnosis not present

## 2022-08-16 DIAGNOSIS — R293 Abnormal posture: Secondary | ICD-10-CM | POA: Diagnosis not present

## 2022-08-16 DIAGNOSIS — M531 Cervicobrachial syndrome: Secondary | ICD-10-CM | POA: Diagnosis not present

## 2022-08-20 DIAGNOSIS — M5136 Other intervertebral disc degeneration, lumbar region: Secondary | ICD-10-CM | POA: Diagnosis not present

## 2022-08-20 DIAGNOSIS — M5416 Radiculopathy, lumbar region: Secondary | ICD-10-CM | POA: Diagnosis not present

## 2022-08-21 DIAGNOSIS — R293 Abnormal posture: Secondary | ICD-10-CM | POA: Diagnosis not present

## 2022-08-21 DIAGNOSIS — M531 Cervicobrachial syndrome: Secondary | ICD-10-CM | POA: Diagnosis not present

## 2022-08-21 DIAGNOSIS — M542 Cervicalgia: Secondary | ICD-10-CM | POA: Diagnosis not present

## 2022-08-23 DIAGNOSIS — M531 Cervicobrachial syndrome: Secondary | ICD-10-CM | POA: Diagnosis not present

## 2022-08-23 DIAGNOSIS — R293 Abnormal posture: Secondary | ICD-10-CM | POA: Diagnosis not present

## 2022-08-23 DIAGNOSIS — M542 Cervicalgia: Secondary | ICD-10-CM | POA: Diagnosis not present

## 2022-08-28 DIAGNOSIS — M542 Cervicalgia: Secondary | ICD-10-CM | POA: Diagnosis not present

## 2022-08-28 DIAGNOSIS — X32XXXD Exposure to sunlight, subsequent encounter: Secondary | ICD-10-CM | POA: Diagnosis not present

## 2022-08-28 DIAGNOSIS — R293 Abnormal posture: Secondary | ICD-10-CM | POA: Diagnosis not present

## 2022-08-28 DIAGNOSIS — M531 Cervicobrachial syndrome: Secondary | ICD-10-CM | POA: Diagnosis not present

## 2022-08-28 DIAGNOSIS — C44219 Basal cell carcinoma of skin of left ear and external auricular canal: Secondary | ICD-10-CM | POA: Diagnosis not present

## 2022-08-28 DIAGNOSIS — L570 Actinic keratosis: Secondary | ICD-10-CM | POA: Diagnosis not present

## 2022-08-31 ENCOUNTER — Other Ambulatory Visit: Payer: Self-pay | Admitting: Cardiology

## 2022-10-30 ENCOUNTER — Other Ambulatory Visit: Payer: Self-pay | Admitting: Cardiology

## 2022-11-13 ENCOUNTER — Other Ambulatory Visit: Payer: Self-pay | Admitting: Cardiology

## 2023-04-17 ENCOUNTER — Ambulatory Visit: Payer: No Typology Code available for payment source | Admitting: Cardiology

## 2023-04-19 ENCOUNTER — Ambulatory Visit: Payer: HMO | Attending: Cardiology | Admitting: Cardiology

## 2023-04-19 ENCOUNTER — Encounter: Payer: Self-pay | Admitting: Cardiology

## 2023-04-19 VITALS — BP 126/70 | HR 74 | Ht 72.0 in | Wt 243.0 lb

## 2023-04-19 DIAGNOSIS — I480 Paroxysmal atrial fibrillation: Secondary | ICD-10-CM | POA: Diagnosis not present

## 2023-04-19 DIAGNOSIS — E7800 Pure hypercholesterolemia, unspecified: Secondary | ICD-10-CM | POA: Diagnosis not present

## 2023-04-19 DIAGNOSIS — I2510 Atherosclerotic heart disease of native coronary artery without angina pectoris: Secondary | ICD-10-CM

## 2023-04-19 MED ORDER — METOPROLOL SUCCINATE ER 25 MG PO TB24: 25.0000 mg | ORAL_TABLET | Freq: Every day | ORAL | 4 refills | Status: DC

## 2023-04-19 NOTE — Patient Instructions (Addendum)
Medication Instructions:  Your physician recommends that you continue on your current medications as directed. Please refer to the Current Medication list given to you today.  *If you need a refill on your cardiac medications before your next appointment, please call your pharmacy*   Lab Work: None.  If you have labs (blood work) drawn today and your tests are completely normal, you will receive your results only by: MyChart Message (if you have MyChart) OR A paper copy in the mail If you have any lab test that is abnormal or we need to change your treatment, we will call you to review the results.   Testing/Procedures: None.   Follow-Up: At [facility], you and your health needs are our priority.  As part of our continuing mission to provide you with exceptional heart care, we have created designated Provider Care Teams.  These Care Teams include your primary Cardiologist (physician) and Advanced Practice Providers (APPs -  Physician Assistants and Nurse Practitioners) who all work together to provide you with the care you need, when you need it.  We recommend signing up for the patient portal called "MyChart".  Sign up information is provided on this After Visit Summary.  MyChart is used to connect with patients for Virtual Visits (Telemedicine).  Patients are able to view lab/test results, encounter notes, upcoming appointments, etc.  Non-urgent messages can be sent to your provider as well.   To learn more about what you can do with MyChart, go to [email or web address].    Your next appointment:   1 year(s)  Provider:   Armanda Magic, MD     Other Instructions Please print out the results of your recent cholesterol lab work (FLP or fasting lipid panel, ALT-alamine transaminase, LP(a) or lipoprotein A) done at the [place] and drop off at our office at your earliest convenience.

## 2023-04-19 NOTE — Progress Notes (Signed)
Date:  04/19/2023   ID:  Brian Ellison, DOB 1940/09/20, MRN [id number]   PCP:  Dois Davenport, MD  Cardiologist:  Armanda Magic, MD  Electrophysiologist:  None   Chief Complaint:  OSA, afib, Coronary artery calcifications  History of Present Illness:     This is a 82yo male with a history of Dm, OSA and hyperlipidemia who was admitted to [facility] 10/04/2018 with complaints of CP. Chest CT neg for PE but showed mild 3v CAD.  He was dx with LLL PNA by chest CT treated with antibx.  He then developed chills, rigors and fever and developed afib with RVR treated with Lopressor 12.5mg  BID.  Echo showed normal LVF with no signficant valvular heart dz.  There was mild LAE.  TSH was normal.  He was started on Xarelto 20mg  daily for CHADS2VAS score of 3.  He converted back to NSR the next day.      He was seen back 10/2018 and was back in atrial flutter and was referred to afib clinic.  He did not have any further CP which was felt to be related to PNA in hospital. In afib clinic, therapeutic options including Tikosyn, amiodarone, vs rate control. He did not want Tikosyn because his wife had an episode of torsades de pointes while being loaded on the medication. He wanted to take some time to consider amiodarone after discussing risks and benefits of the medication.    He later decided to start on Amio and was loaded on 200mg  BID and converted to NSR at next afib clinic.  His Amio was decreased to 200mg  daily.  Due to ongoing DOE , Nuclear stress test was done which showed no ischemia.  He also had a  spontaneous hematoma of his thigh and Xarelto was stopped.  Since his afib occurred in setting of acute illness, an event monitor was done which showed no further afib and he is now off Amio as well.    He is here today for followup and is doing well.  He denies any chest pain or pressure, SOB, DOE, PND, orthopnea, LE edema, palpitations or syncope. He had some problems with dizziness from low BP and this  resolved after he went off Tamulosin. He is compliant with his meds and is tolerating meds with no SE.    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   History of shingles    Lower urinary tract symptoms (LUTS)    OSA on CPAP    very severe per study 01-31-2006   Peyronie's disease    S/P insertion of spinal cord stimulator    2010   Tingling    right arm;takes Gabapentin   Type 2 diabetes mellitus (HCC)    Past Surgical  History:  Procedure Laterality Date   ANTERIOR CERVICAL DECOMP/DISCECTOMY FUSION  06-16-2004   C3 -- C5   APPENDECTOMY  age 4   CATARACT EXTRACTION W/ INTRAOCULAR LENS  IMPLANT, BILATERAL     COLONOSCOPY     CYSTOSCOPY WITH BIOPSY N/A 07/15/2015   Procedure: CYSTOSCOPY WITH BLADDER AND PROSTATE BIOPSY;  Surgeon: Jerilee Field, MD;  Location: [facility];  Service: Urology;  Laterality: N/A;   HAND SURGERY Right 2017   "right hand middle finger knuckle replaced"   INGUINAL HERNIA REPAIR Bilateral 1970's   NASAL SEPTUM SURGERY  yrs ago   PENILE PROSTHESIS IMPLANT  11-26-2007   PERMANANT SPINAL CORD STIMULATOR IMPLANT VIA THORACIC LAMINECTOMY  05-12-2009   Medtronic generator (left buttock)   POSTERIOR LAMINECTOMY / DECOMPRESSION CERVICAL SPINE  03-08-2005   C3  -- C6   RETINAL DETACHMENT SURGERY Right    REVERSE SHOULDER ARTHROPLASTY Left 09/23/2015   Procedure: LEFT REVERSE TOTAL SHOULDER  ARTHROPLASTY;  Surgeon: Beverely Low, MD;  Location: [facility] OR;  Service: Orthopedics;  Laterality: Left;   REVISION TOTAL ARTHROPLASTY RIGHT SHOULDER  07-22-2009   RIGHT HEEL SURGERY     spurs   RIGHT SHOULDER HEMIARTHROPLASTY  08-20-2008   ROTATOR CUFF REPAIR Bilateral x1 right/  x2  left - last one 2013   SHOULDER OPEN ROTATOR CUFF REPAIR Bilateral    SPINAL CORD STIMULATOR BATTERY EXCHANGE N/A 07/29/2018   Procedure: Spinal cord stimulator/implantable pulse generator battery change;  Surgeon: Maeola Harman, MD;  Location[facility] OR;  Service: Neurosurgery;  Laterality: N/A;  Spinal cord stimulator/implantable pulse generator battery change   TONSILLECTOMY     TOTAL KNEE ARTHROPLASTY Right 04/17/2013   Procedure: RIGHT TOTAL KNEE ARTHROPLASTY;  Surgeon: Verlee Rossetti, MD;  Location: [facility] OR;  Service: Orthopedics;  Laterality: Right;   TOTAL KNEE ARTHROPLASTY Left 02-10-2009     Current Meds  Medication Sig   aspirin 81 MG EC tablet Take 1 tablet by mouth daily.   brimonidine (ALPHAGAN) 0.15 % ophthalmic solution Place 1 drop into both eyes 2 (two) times daily.   chlorpheniramine (CHLOR-TRIMETON) 4 MG tablet Take 4 mg by mouth.   Cholecalciferol (VITAMIN D3 PO) Take 1 capsule by mouth daily.   diclofenac (VOLTAREN) 75 MG EC tablet Take 75 mg by mouth daily.   empagliflozin (JARDIANCE) 10 MG TABS tablet    famotidine (PEPCID) 40 MG tablet Take 40 mg by mouth 2 (two) times daily.   gabapentin (NEURONTIN) 300 MG capsule Take 900 mg by mouth daily.   GLIPIZIDE PO Take 5 mg by mouth daily.    golimumab 2 mg/kg in sodium chloride 0.9 % Inject 2 mg/kg into the vein every 8 (eight) weeks.   metFORMIN (GLUCOPHAGE) 500 MG tablet Take 500 mg by mouth at bedtime.   metoprolol succinate (TOPROL-XL) 25 MG 24 hr tablet Take 1 tablet (25 mg total) by mouth daily. Please keep upcoming appointment with Dr. Mayford Knife in August for any future refills. Thank you.   oxyCODONE-acetaminophen (PERCOCET/ROXICET) 5-325 MG  tablet Take by mouth.   pantoprazole (PROTONIX) 40 MG tablet Take 40 mg by mouth daily.   potassium chloride (K-DUR) 10 MEQ tablet Take 1 tablet (10 mEq total) by mouth daily.   rOPINIRole (REQUIP) 1 MG tablet Take 1 mg by mouth at bedtime.     rosuvastatin (CRESTOR) 10 MG tablet Take 1 tablet (10 mg total) by mouth daily.   Semaglutide,0.25 or 0.5MG /DOS, (OZEMPIC, 0.25 OR 0.5 MG/DOSE,) 2 MG/3ML SOPN 0.25 mg  Subcutaneous Once weekly   Tamsulosin HCl (FLOMAX PO)    vitamin B-12 (CYANOCOBALAMIN) 500 MCG tablet Take 750 mcg by mouth daily.      Allergies:   Pravastatin   Social History   Tobacco Use   Smoking status: Former    Current packs/day: 0.00    Average packs/day: 1 pack/day for 15.0 years (15.0 ttl pk-yrs)    Types: Cigarettes    Start date: 07/07/1956    Quit date: 07/08/1971    Years since quitting: 51.8   Smokeless tobacco: Never  Vaping Use   Vaping status: Never Used  Substance Use Topics   Alcohol use: Yes    Comment: rare   Drug use: No     Family Hx: The patient's family history includes Alcoholism in his father and mother; Diabetes in his father.  ROS:   Please see the history of present illness.     All other systems reviewed and are negative.   Labs/Other Tests and Data Reviewed:    EKG Interpretation Date/Time:  Friday April 19 2023 13:00:05 EDT Ventricular Rate:  69 PR Interval:  258 QRS Duration:  86 QT Interval:  400 QTC Calculation: 428 R Axis:   -22  Text Interpretation: Sinus rhythm with 1st degree A-V block Nonspecific T wave abnormality When compared with ECG of 15-Sep-2019 14:37, PREVIOUS ECG IS PRESENT No significant change since last tracing Confirmed by Armanda Magic [id number]) on 04/19/2023 1:08:14 PM  EKG Interpretation Date/Time:  Friday April 19 2023 13:00:05 EDT Ventricular Rate:  69 PR Interval:  258 QRS Duration:  86 QT Interval:  400 QTC Calculation: 428 R Axis:   -22  Text Interpretation: Sinus rhythm with 1st degree A-V  block Nonspecific T wave abnormality When compared with ECG of 15-Sep-2019 14:37, PREVIOUS ECG IS PRESENT No significant change since last tracing Confirmed by Armanda Magic ([id number]) on 04/19/2023 1:08:14 PM    Recent Labs: No results found for requested labs within last 365 days.   Recent Lipid Panel Lab Results  Component Value Date/Time   CHOL 102 10/12/2020 08:28 AM   TRIG 125 10/12/2020 08:28 AM   HDL 40 10/12/2020 08:28 AM   CHOLHDL 2.6 10/12/2020 08:28 AM   CHOLHDL 3.1 10/04/2018 01:32 PM   LDLCALC 40 10/12/2020 08:28 AM    Wt Readings from Last 3 Encounters:  04/19/23 243 lb (110.2 kg)  05/03/22 250 lb (113.4 kg)  01/05/22 245 lb (111.1 kg)     Objective:    Vital Signs:  BP 126/70   Pulse 74   Ht 6' (1.829 m)   Wt 243 lb (110.2 kg)   SpO2 93%   BMI 32.96 kg/m   GEN: Well nourished, well developed in no acute distress HEENT: Normal NECK: No JVD; No carotid bruits LYMPHATICS: No lymphadenopathy CARDIAC:RRR, no murmurs, rubs, gallops RESPIRATORY:  Clear to auscultation without rales, wheezing or rhonchi  ABDOMEN: Soft, non-tender, non-distended MUSCULOSKELETAL:  trace ankle  edema; No deformity  SKIN: Warm and dry NEUROLOGIC:  Alert and oriented x 3 PSYCHIATRIC:  Normal affect  ASSESSMENT & PLAN:    1.  Paroxysmal atrial fibrillation  -this occurred in the setting of acute illness and has not had any further episodes and Amio was stopped -He remains in normal sinus rhythm today and denies any recent palpitations -Continue prescription drug management with Toprol XL 25 mg daily with as needed refills -Xarelto was stopped due to spontaneous thigh hematoma   2.  Coronary artery calcifications  -no  ischemia on nuclear stress test 11/2018  -He has not any anginal symptoms since I saw him last -continue Prescription drug management with aspirin 81 mg daily and Crestor 10 mg daily with as needed refills   3.  Hyperlipidemia  -LDL goal < 70 due to coronary artery  calcifications.   -I will get a copy of his labs from the [place] -Continue prescription drug management with Crestor 10 mg daily with as needed refills  Follow-up with me in 1 year    Medication Adjustments/Labs and Tests Ordered: Current medicines are reviewed at length with the patient today.  Concerns regarding medicines are outlined above.  Tests Ordered: Orders Placed This Encounter  Procedures   EKG 12-Lead    Medication Changes: No orders of the defined types were placed in this encounter.    Disposition:  Follow up in 6 month(s)  Signed, Armanda Magic, MD  04/19/2023 1:12 PM     [facility]

## 2023-04-19 NOTE — Addendum Note (Signed)
Addended by: Luellen Pucker on: 04/19/2023 01:20 PM   Modules accepted: Orders

## 2023-04-29 ENCOUNTER — Ambulatory Visit: Payer: No Typology Code available for payment source | Admitting: Internal Medicine

## 2023-04-30 ENCOUNTER — Telehealth: Payer: Self-pay

## 2023-04-30 NOTE — Telephone Encounter (Signed)
-----   Message from Nurse Alcario Drought E sent at 04/19/2023  1:26 PM EDT ----- [place] [facility]

## 2023-04-30 NOTE — Telephone Encounter (Signed)
Called patient to see if he had a copy of last FLP from [place] or to verify [place] facility he uses. No answer, left detailed message per [facility] requesting labs or information in order to get labs sent to our office.

## 2023-04-30 NOTE — Telephone Encounter (Signed)
Call to [facility] to request labs, left message with direct phone # for call back.

## 2023-07-11 ENCOUNTER — Telehealth: Payer: Self-pay

## 2023-07-11 NOTE — Telephone Encounter (Signed)
Call to patient. Advised that we have called and faxed multiple requests for his labs from [place] (FLP/ALT) and have not received them. Patient states he will request paper copy of labs at his next appt at [facility].

## 2023-07-11 NOTE — Telephone Encounter (Signed)
-----   Message from Nurse Alcario Drought E sent at 04/19/2023  1:26 PM EDT ----- [place] [facility]

## 2023-09-17 ENCOUNTER — Other Ambulatory Visit: Payer: Self-pay | Admitting: Cardiology

## 2023-11-29 ENCOUNTER — Emergency Department (HOSPITAL_COMMUNITY): Payer: Self-pay

## 2023-11-29 ENCOUNTER — Other Ambulatory Visit: Payer: Self-pay

## 2023-11-29 ENCOUNTER — Emergency Department (HOSPITAL_COMMUNITY)
Admission: EM | Admit: 2023-11-29 | Discharge: 2023-11-29 | Disposition: A | Discharge status: Home / Self Care | Payer: Self-pay | Attending: Emergency Medicine | Admitting: Emergency Medicine

## 2023-11-29 ENCOUNTER — Encounter (HOSPITAL_COMMUNITY): Payer: Self-pay | Admitting: Emergency Medicine

## 2023-11-29 DIAGNOSIS — M25562 Pain in left knee: Secondary | ICD-10-CM | POA: Insufficient documentation

## 2023-11-29 DIAGNOSIS — S8012XA Contusion of left lower leg, initial encounter: Secondary | ICD-10-CM | POA: Diagnosis not present

## 2023-11-29 DIAGNOSIS — R0789 Other chest pain: Secondary | ICD-10-CM | POA: Insufficient documentation

## 2023-11-29 DIAGNOSIS — Z7982 Long term (current) use of aspirin: Secondary | ICD-10-CM | POA: Diagnosis not present

## 2023-11-29 DIAGNOSIS — K429 Umbilical hernia without obstruction or gangrene: Secondary | ICD-10-CM | POA: Insufficient documentation

## 2023-11-29 DIAGNOSIS — S0990XA Unspecified injury of head, initial encounter: Secondary | ICD-10-CM | POA: Insufficient documentation

## 2023-11-29 DIAGNOSIS — K5730 Diverticulosis of large intestine without perforation or abscess without bleeding: Secondary | ICD-10-CM | POA: Insufficient documentation

## 2023-11-29 DIAGNOSIS — M79605 Pain in left leg: Secondary | ICD-10-CM | POA: Diagnosis present

## 2023-11-29 DIAGNOSIS — Y92481 Parking lot as the place of occurrence of the external cause: Secondary | ICD-10-CM | POA: Insufficient documentation

## 2023-11-29 LAB — I-STAT CHEM 8, ED
BUN: 17 mg/dL (ref 8–23)
Calcium, Ion: 1.16 mmol/L (ref 1.15–1.40)
Chloride: 110 mmol/L (ref 98–111)
Creatinine, Ser: 0.8 mg/dL (ref 0.61–1.24)
Glucose, Bld: 87 mg/dL (ref 70–99)
HCT: 42 % (ref 39.0–52.0)
Hemoglobin: 14.3 g/dL (ref 13.0–17.0)
Potassium: 4.1 mmol/L (ref 3.5–5.1)
Sodium: 141 mmol/L (ref 135–145)
TCO2: 24 mmol/L (ref 22–32)

## 2023-11-29 LAB — CBC
HCT: 43.1 % (ref 39.0–52.0)
Hemoglobin: 13.7 g/dL (ref 13.0–17.0)
MCH: 29.1 pg (ref 26.0–34.0)
MCHC: 31.8 g/dL (ref 30.0–36.0)
MCV: 91.7 fL (ref 80.0–100.0)
Platelets: 170 10*3/uL (ref 150–400)
RBC: 4.7 MIL/uL (ref 4.22–5.81)
RDW: 13.8 % (ref 11.5–15.5)
WBC: 10.8 10*3/uL — ABNORMAL HIGH (ref 4.0–10.5)
nRBC: 0 % (ref 0.0–0.2)

## 2023-11-29 LAB — URINALYSIS, ROUTINE W REFLEX MICROSCOPIC
Bilirubin Urine: NEGATIVE
Glucose, UA: >=500 mg/dL — AB
Ketones, ur: NEGATIVE mg/dL
Leukocytes,Ua: NEGATIVE
Nitrite: POSITIVE — AB
Protein, ur: NEGATIVE mg/dL
Specific Gravity, Urine: 1.02 (ref 1.005–1.030)
pH: 5.5 (ref 5.0–8.0)

## 2023-11-29 LAB — URINALYSIS, MICROSCOPIC (REFLEX)

## 2023-11-29 LAB — COMPREHENSIVE METABOLIC PANEL
ALT: 26 U/L (ref 0–44)
AST: 20 U/L (ref 15–41)
Albumin: 3.6 g/dL (ref 3.5–5.0)
Alkaline Phosphatase: 41 U/L (ref 38–126)
Anion gap: 11 (ref 5–15)
BUN: 13 mg/dL (ref 8–23)
CO2: 23 mmol/L (ref 22–32)
Calcium: 9.2 mg/dL (ref 8.9–10.3)
Chloride: 107 mmol/L (ref 98–111)
Creatinine, Ser: 0.9 mg/dL (ref 0.61–1.24)
GFR, Estimated: >60 mL/min (ref >60)
Glucose, Bld: 93 mg/dL (ref 70–99)
Potassium: 4.1 mmol/L (ref 3.5–5.1)
Sodium: 141 mmol/L (ref 135–145)
Total Bilirubin: 0.3 mg/dL (ref 0.0–1.2)
Total Protein: 6.5 g/dL (ref 6.5–8.1)

## 2023-11-29 LAB — PROTIME-INR
INR: 1.1 (ref 0.8–1.2)
Prothrombin Time: 13.9 s (ref 11.4–15.2)

## 2023-11-29 LAB — TROPONIN I (HIGH SENSITIVITY)
Troponin I (High Sensitivity): 5 ng/L (ref <18)
Troponin I (High Sensitivity): 4 ng/L (ref <18)

## 2023-11-29 LAB — I-STAT CG4 LACTIC ACID, ED: Lactic Acid, Venous: 1 mmol/L (ref 0.5–1.9)

## 2023-11-29 MED ORDER — IOHEXOL 350 MG/ML SOLN
75.0000 mL | Freq: Once | INTRAVENOUS | Status: AC | PRN
  Administered 2023-11-29: 75 mL via INTRAVENOUS
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
## 2023-12-03 ENCOUNTER — Telehealth: Payer: Self-pay | Admitting: Cardiology
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
## 2023-12-03 NOTE — Telephone Encounter (Signed)
 Will send this message/FYI from the pt,  to Dr. Mayford Knife for further review and advisement if needed.

## 2023-12-04 NOTE — Telephone Encounter (Signed)
 Spoke with pt regarding his lab results. Pt verbalized understanding. All questions, if any, were answered. Pt mentioned he had multiple xrays and CT scans that he would like Dr. Mayford Knife to review. Pt was told this information would be forwarded to Dr. Mayford Knife.

## 2023-12-04 NOTE — Telephone Encounter (Signed)
 The patient has been notified of the result and verbalized understanding.  All questions (if any) were answered. Erick Alley, RN 12/04/2023 4:18 PM

## 2024-04-13 ENCOUNTER — Ambulatory Visit: Admitting: Cardiology

## 2024-04-16 ENCOUNTER — Encounter: Payer: Self-pay | Admitting: Cardiology

## 2024-04-16 ENCOUNTER — Ambulatory Visit: Attending: Cardiology | Admitting: Cardiology

## 2024-04-16 VITALS — BP 90/58 | HR 75 | Ht 72.0 in | Wt 241.0 lb

## 2024-04-16 DIAGNOSIS — G4733 Obstructive sleep apnea (adult) (pediatric): Secondary | ICD-10-CM | POA: Diagnosis not present

## 2024-04-16 DIAGNOSIS — I2510 Atherosclerotic heart disease of native coronary artery without angina pectoris: Secondary | ICD-10-CM

## 2024-04-16 DIAGNOSIS — E7800 Pure hypercholesterolemia, unspecified: Secondary | ICD-10-CM | POA: Diagnosis not present

## 2024-04-16 DIAGNOSIS — I480 Paroxysmal atrial fibrillation: Secondary | ICD-10-CM

## 2024-04-16 NOTE — Progress Notes (Addendum)
 Date:  04/16/2024   ID:  Brian Ellison, DOB September 27, 1940, MRN [id number]   PCP:  Burney Darice CROME, MD  Cardiologist:  Wilbert Bihari, MD  Electrophysiologist:  None   Chief Complaint:  OSA, afib, Coronary artery calcifications  History of Present Illness:     This is a 83yo male with a history of Dm, OSA and hyperlipidemia who was admitted to [facility] 10/04/2018 with complaints of CP. Chest CT neg for PE but showed mild 3v CAD.  He was dx with LLL PNA by chest CT treated with antibx.  He then developed chills, rigors and fever and developed afib with RVR treated with Lopressor  12.5mg  BID.  Echo showed normal LVF with no signficant valvular heart dz.  There was mild LAE.  TSH was normal.  He was started on Xarelto  20mg  daily for CHADS2VAS score of 3.  He converted back to NSR the next day.      He was seen back 10/2018 and was back in atrial flutter and was referred to afib clinic.  He did not have any further CP which was felt to be related to PNA in hospital. In afib clinic, therapeutic options including Tikosyn, amiodarone , vs rate control. He did not want Tikosyn because his wife had an episode of torsades de pointes while being loaded on the medication. He wanted to take some time to consider amiodarone  after discussing risks and benefits of the medication.    He later decided to start on Amio and was loaded on 200mg  BID and converted to NSR at next afib clinic.  His Amio was decreased to 200mg  daily.  Due to ongoing DOE , Nuclear stress test was done which showed no ischemia.  He also had a  spontaneous hematoma of his thigh and Xarelto  was stopped.  Since his afib occurred in setting of acute illness, an event monitor was done which showed no further afib and he is now off Amio as well.    He is here today for follow-up and is doing well.  He denies any chest pain or pressure, shortness of breath, DOE, PND, orthopnea, lower extremity edema, dizziness or palpitations.  He was followed by Dr.  Quita Salt in the past but has not seen him recently.  He gets his supplies through the [facility]. He tolerates the nasal mask but it leaks a lot.  He feels the pressure is adequate and gets enough air. He does not feel rested when he gest up in the am and aches all over his body.  He has excessive daytime sleepiness and naps during the day.  He denies any significant mouth or nasal dryness or nasal congestion.  He does not think that he snores. An Epworth Sleepiness Scale score was calculated the office today and this endorsed at 12 which is concerning for residual daytime sleepiness. Patient denies any episodes of bruxism,  No gagging hallucinations or cataplectic events.  He does have problems with restless legs.  He does not dream much unless he takes melatonin.   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  On CPAP followed at [facility]   Peyronie's disease    S/P insertion of spinal cord stimulator    2010   Tingling    right arm;takes Gabapentin    Type 2 diabetes mellitus (HCC)    Past Surgical History:  Procedure Laterality Date   ANTERIOR CERVICAL DECOMP/DISCECTOMY FUSION  06-16-2004   C3 -- C5   APPENDECTOMY  age 2   CATARACT EXTRACTION W/ INTRAOCULAR LENS  IMPLANT, BILATERAL     COLONOSCOPY     CYSTOSCOPY WITH BIOPSY N/A  07/15/2015   Procedure: CYSTOSCOPY WITH BLADDER AND PROSTATE BIOPSY;  Surgeon: Donnice Brooks, MD;  Location: [facility];  Service: Urology;  Laterality: N/A;   HAND SURGERY Right 2017   right hand middle finger knuckle replaced   INGUINAL HERNIA REPAIR Bilateral 1970's   NASAL SEPTUM SURGERY  yrs ago   PENILE PROSTHESIS IMPLANT  11-26-2007   PERMANANT SPINAL CORD STIMULATOR IMPLANT VIA THORACIC LAMINECTOMY  05-12-2009   Medtronic generator (left buttock)   POSTERIOR LAMINECTOMY / DECOMPRESSION CERVICAL SPINE  03-08-2005   C3  -- C6   RETINAL DETACHMENT SURGERY Right    REVERSE SHOULDER ARTHROPLASTY Left 09/23/2015   Procedure: LEFT REVERSE TOTAL SHOULDER ARTHROPLASTY;  Surgeon: Marcey Her, MD;  Location: [facility] OR;  Service: Orthopedics;  Laterality: Left;   REVISION TOTAL ARTHROPLASTY RIGHT SHOULDER  07-22-2009   RIGHT HEEL SURGERY     spurs   RIGHT SHOULDER HEMIARTHROPLASTY  08-20-2008   ROTATOR CUFF REPAIR Bilateral x1 right/  x2  left - last one 2013   SHOULDER OPEN ROTATOR CUFF REPAIR Bilateral    SPINAL CORD STIMULATOR BATTERY EXCHANGE N/A 07/29/2018   Procedure: Spinal cord stimulator/implantable pulse generator battery change;  Surgeon: Unice Pac, MD;  Location: [facility] OR;  Service: Neurosurgery;  Laterality: N/A;  Spinal cord stimulator/implantable pulse generator battery change   TONSILLECTOMY     TOTAL KNEE ARTHROPLASTY Right 04/17/2013   Procedure: RIGHT TOTAL KNEE ARTHROPLASTY;  Surgeon: Elspeth JONELLE Her, MD;  Location: [facility];  Service: Orthopedics;  Laterality: Right;   TOTAL KNEE ARTHROPLASTY Left 02-10-2009     Current Meds  Medication Sig   aspirin  81 MG EC tablet Take 1 tablet by mouth daily.   brimonidine  (ALPHAGAN ) 0.15 % ophthalmic solution Place 1 drop into both eyes 2 (two) times daily.   chlorpheniramine (CHLOR-TRIMETON) 4 MG tablet Take 4 mg by mouth.   Cholecalciferol  (VITAMIN D3 PO) Take 1 capsule by mouth daily.   diclofenac (VOLTAREN) 75 MG  EC tablet Take 75 mg by mouth daily.   empagliflozin (JARDIANCE) 10 MG TABS tablet    famotidine (PEPCID) 40 MG tablet Take 40 mg by mouth 2 (two) times daily.   gabapentin  (NEURONTIN ) 300 MG capsule Take 900 mg by mouth daily.   GLIPIZIDE PO Take 5 mg by mouth daily.    golimumab  2 mg/kg in sodium chloride  0.9 % Inject 2 mg/kg into the vein every 8 (eight) weeks.   metFORMIN  (GLUCOPHAGE ) 500 MG tablet Take 500  mg by mouth at bedtime.   metoprolol  succinate (TOPROL -XL) 25 MG 24 hr tablet Take 1 tablet by mouth once daily   oxyCODONE -acetaminophen  (PERCOCET/ROXICET) 5-325 MG tablet Take by mouth.   pantoprazole  (PROTONIX ) 40 MG tablet Take 40 mg by mouth daily.   potassium chloride  (K-DUR) 10 MEQ tablet Take 1 tablet (10 mEq total) by mouth daily.   rOPINIRole  (REQUIP ) 1 MG tablet Take 1 mg by mouth at bedtime.     rosuvastatin  (CRESTOR ) 10 MG tablet Take 1 tablet (10 mg total) by mouth daily.   Semaglutide,0.25 or 0.5MG /DOS, (OZEMPIC, 0.25 OR 0.5 MG/DOSE,) 2 MG/3ML SOPN 0.25 mg Subcutaneous Once weekly   vitamin B-12 (CYANOCOBALAMIN ) 500 MCG tablet Take 750 mcg by mouth daily.      Allergies:   Pravastatin   Social History   Tobacco Use   Smoking status: Former    Current packs/day: 0.00    Average packs/day: 1 pack/day for 15.0 years (15.0 ttl pk-yrs)    Types: Cigarettes    Start date: 07/07/1956    Quit date: 07/08/1971    Years since quitting: 52.8   Smokeless tobacco: Never  Vaping Use   Vaping status: Never Used  Substance Use Topics   Alcohol use: Yes    Comment: rare   Drug use: No     Family Hx: The patient's family history includes Alcoholism in his father and mother; Diabetes in his father.  ROS:   Please see the history of present illness.     All other systems reviewed and are negative.   Labs/Other Tests and Data Reviewed:            Recent Labs: 11/29/2023: ALT 26; BUN 17; Creatinine, Ser 0.80; Hemoglobin 14.3; Platelets 170; Potassium 4.1; Sodium  141   Recent Lipid Panel Lab Results  Component Value Date/Time   CHOL 102 10/12/2020 08:28 AM   TRIG 125 10/12/2020 08:28 AM   HDL 40 10/12/2020 08:28 AM   CHOLHDL 2.6 10/12/2020 08:28 AM   CHOLHDL 3.1 10/04/2018 01:32 PM   LDLCALC 40 10/12/2020 08:28 AM    Wt Readings from Last 3 Encounters:  04/16/24 241 lb (109.3 kg)  11/29/23 233 lb 11 oz (106 kg)  04/19/23 243 lb (110.2 kg)     Objective:    Vital Signs:  BP (!) 90/58 Comment: auto  Pulse 75   Ht 6' (1.829 m)   Wt 241 lb (109.3 kg)   SpO2 95%   BMI 32.69 kg/m   GEN: Well nourished, well developed in no acute distress HEENT: Normal NECK: No JVD; No carotid bruits LYMPHATICS: No lymphadenopathy CARDIAC:RRR, no murmurs, rubs, gallops RESPIRATORY:  Clear to auscultation without rales, wheezing or rhonchi  ABDOMEN: Soft, non-tender, non-distended MUSCULOSKELETAL:  trace edema; No deformity  SKIN: Warm and dry NEUROLOGIC:  Alert and oriented x 3 PSYCHIATRIC:  Normal affect  ASSESSMENT & PLAN:    1.  Paroxysmal atrial fibrillation  -this occurred in the setting of acute illness and has not had any further episodes and Amio was stopped -He remains in normal sinus rhythm and denies any palpitations -Continue Toprol  XL 25 mg daily with as needed refills -Xarelto  was stopped due to spontaneous thigh hematoma   2.  Coronary artery calcifications  - no ischemia on nuclear stress test 11/2018  - He denies any anginal symptoms - Continue Crestor  10 mg daily with as needed refills - Continue ASA 81 mg daily   3.  Hyperlipidemia  -LDL goal < 70  due to coronary artery calcifications.   -I will get a copy of his labs from the [facility] -continue Crestor  10 mg daily with as needed refills  4.  OSA -he has a dx of OSA and has been on CPAP but currently is not followed in sleep clinic -He thinks his device is over 68 years old -he complains of ongoing daytime sleepiness, mask leaking -I have recommended a split night sleep study  to make sure he is adequately titrated and get a new mask that fits.   5.  Soft BP -SBP 90 this am but says it usually is around 120 -denies any dizziness or syncope -will let his PCP know  Follow-up with me after he gets a new device    Medication Adjustments/Labs and Tests Ordered: Current medicines are reviewed at length with the patient today.  Concerns regarding medicines are outlined above.  Tests Ordered: No orders of the defined types were placed in this encounter.   Medication Changes: No orders of the defined types were placed in this encounter.    Disposition:  Follow up in 6 month(s)  Signed, Wilbert Bihari, MD  04/16/2024 9:08 AM    [facility]

## 2024-04-16 NOTE — Patient Instructions (Signed)
 Medication Instructions:  Your physician recommends that you continue on your current medications as directed. Please refer to the Current Medication list given to you today.  *If you need a refill on your cardiac medications before your next appointment, please call your pharmacy*  Lab Work: Please have your [facility] provider fax your recent labs to us  at [phone].  If you have labs (blood work) drawn today and your tests are completely normal, you will receive your results only by: MyChart Message (if you have MyChart) OR A paper copy in the mail If you have any lab test that is abnormal or we need to change your treatment, we will call you to review the results.  Testing/Procedures: Your physician has recommended that you have a split night sleep study. This test records several body functions during sleep, including: brain activity, eye movement, oxygen and carbon dioxide blood levels, heart rate and rhythm, breathing rate and rhythm, the flow of air through your mouth and nose, snoring, body muscle movements, and chest and belly movement.   Follow-Up: At [facility], you and your health needs are our priority.  As part of our continuing mission to provide you with exceptional heart care, our providers are all part of one team.  This team includes your primary Cardiologist (physician) and Advanced Practice Providers or APPs (Physician Assistants and Nurse Practitioners) who all work together to provide you with the care you need, when you need it.  Your next appointment will be dependent on delivery of your new sleep study and it will be with:    Provider:   Wilbert Bihari, MD

## 2024-05-04 ENCOUNTER — Other Ambulatory Visit: Payer: Self-pay | Admitting: Cardiology
